# Patient Record
Sex: Female | Born: 1965
Health system: Southern US, Community
[De-identification: ages and names within clinical notes are randomized; demographics above are authoritative.]

## PROBLEM LIST (undated history)

## (undated) DIAGNOSIS — I10 Essential (primary) hypertension: Secondary | ICD-10-CM

## (undated) DIAGNOSIS — K589 Irritable bowel syndrome without diarrhea: Secondary | ICD-10-CM

## (undated) DIAGNOSIS — N946 Dysmenorrhea, unspecified: Secondary | ICD-10-CM

## (undated) DIAGNOSIS — M25561 Pain in right knee: Secondary | ICD-10-CM

## (undated) DIAGNOSIS — IMO0002 Reserved for concepts with insufficient information to code with codable children: Secondary | ICD-10-CM

## (undated) DIAGNOSIS — K1321 Leukoplakia of oral mucosa, including tongue: Secondary | ICD-10-CM

## (undated) DIAGNOSIS — R5383 Other fatigue: Secondary | ICD-10-CM

## (undated) DIAGNOSIS — D219 Benign neoplasm of connective and other soft tissue, unspecified: Secondary | ICD-10-CM

## (undated) DIAGNOSIS — F32A Depression, unspecified: Secondary | ICD-10-CM

## (undated) DIAGNOSIS — G4730 Sleep apnea, unspecified: Secondary | ICD-10-CM

## (undated) DIAGNOSIS — R0683 Snoring: Secondary | ICD-10-CM

## (undated) DIAGNOSIS — E785 Hyperlipidemia, unspecified: Secondary | ICD-10-CM

## (undated) DIAGNOSIS — M25562 Pain in left knee: Secondary | ICD-10-CM

## (undated) DIAGNOSIS — K219 Gastro-esophageal reflux disease without esophagitis: Secondary | ICD-10-CM

## (undated) DIAGNOSIS — F329 Major depressive disorder, single episode, unspecified: Secondary | ICD-10-CM

## (undated) DIAGNOSIS — N920 Excessive and frequent menstruation with regular cycle: Secondary | ICD-10-CM

## (undated) DIAGNOSIS — G4733 Obstructive sleep apnea (adult) (pediatric): Secondary | ICD-10-CM

## (undated) DIAGNOSIS — R7301 Impaired fasting glucose: Secondary | ICD-10-CM

## (undated) DIAGNOSIS — T7840XA Allergy, unspecified, initial encounter: Secondary | ICD-10-CM

## (undated) DIAGNOSIS — F419 Anxiety disorder, unspecified: Secondary | ICD-10-CM

## (undated) HISTORY — DX: Excessive and frequent menstruation with regular cycle: N92.0

## (undated) HISTORY — DX: Anxiety disorder, unspecified: F41.9

## (undated) HISTORY — DX: Allergy, unspecified, initial encounter: T78.40XA

## (undated) HISTORY — DX: Snoring: R06.83

## (undated) HISTORY — DX: Leukoplakia of oral mucosa, including tongue: K13.21

## (undated) HISTORY — DX: Gastro-esophageal reflux disease without esophagitis: K21.9

## (undated) HISTORY — PX: DILATION AND CURETTAGE OF UTERUS: SHX78

## (undated) HISTORY — DX: Reserved for concepts with insufficient information to code with codable children: IMO0002

## (undated) HISTORY — DX: Other fatigue: R53.83

## (undated) HISTORY — DX: Dysmenorrhea, unspecified: N94.6

## (undated) HISTORY — DX: Hyperlipidemia, unspecified: E78.5

## (undated) HISTORY — DX: Pain in right knee: M25.562

## (undated) HISTORY — DX: Essential (primary) hypertension: I10

## (undated) HISTORY — DX: Impaired fasting glucose: R73.01

## (undated) HISTORY — DX: Irritable bowel syndrome, unspecified: K58.9

## (undated) HISTORY — DX: Sleep apnea, unspecified: G47.30

## (undated) HISTORY — DX: Pain in right knee: M25.561

## (undated) HISTORY — DX: Obstructive sleep apnea (adult) (pediatric): G47.33

## (undated) HISTORY — PX: OTHER SURGICAL HISTORY: SHX169

## (undated) HISTORY — DX: Benign neoplasm of connective and other soft tissue, unspecified: D21.9

---

## 1998-04-04 HISTORY — PX: ECTOPIC PREGNANCY SURGERY: SHX613

## 2004-04-29 ENCOUNTER — Ambulatory Visit: Payer: Self-pay | Admitting: Family Medicine

## 2004-05-06 ENCOUNTER — Ambulatory Visit (HOSPITAL_COMMUNITY): Admission: RE | Admit: 2004-05-06 | Discharge: 2004-05-06 | Payer: Self-pay | Admitting: Family Medicine

## 2004-05-06 ENCOUNTER — Ambulatory Visit (HOSPITAL_COMMUNITY): Admission: RE | Admit: 2004-05-06 | Discharge: 2004-05-06 | Payer: Self-pay | Admitting: *Deleted

## 2004-05-21 ENCOUNTER — Ambulatory Visit: Payer: Self-pay | Admitting: Internal Medicine

## 2004-06-04 ENCOUNTER — Ambulatory Visit: Payer: Self-pay | Admitting: Internal Medicine

## 2004-06-10 ENCOUNTER — Ambulatory Visit: Payer: Self-pay | Admitting: Internal Medicine

## 2005-04-04 HISTORY — PX: COLONOSCOPY: SHX174

## 2005-04-28 ENCOUNTER — Ambulatory Visit: Payer: Self-pay | Admitting: Internal Medicine

## 2005-05-06 ENCOUNTER — Ambulatory Visit: Payer: Self-pay | Admitting: Internal Medicine

## 2005-07-14 ENCOUNTER — Ambulatory Visit: Payer: Self-pay | Admitting: Internal Medicine

## 2005-07-18 ENCOUNTER — Ambulatory Visit: Payer: Self-pay | Admitting: Internal Medicine

## 2005-07-28 ENCOUNTER — Ambulatory Visit: Payer: Self-pay | Admitting: Gynecology

## 2005-07-28 ENCOUNTER — Encounter (INDEPENDENT_AMBULATORY_CARE_PROVIDER_SITE_OTHER): Payer: Self-pay | Admitting: Gynecology

## 2005-08-04 ENCOUNTER — Ambulatory Visit: Payer: Self-pay | Admitting: Gastroenterology

## 2005-08-05 ENCOUNTER — Ambulatory Visit: Payer: Self-pay | Admitting: Gastroenterology

## 2005-08-05 ENCOUNTER — Encounter (INDEPENDENT_AMBULATORY_CARE_PROVIDER_SITE_OTHER): Payer: Self-pay | Admitting: *Deleted

## 2006-07-27 ENCOUNTER — Ambulatory Visit: Payer: Self-pay | Admitting: Internal Medicine

## 2006-07-27 LAB — CONVERTED CEMR LAB
ALT: 22 units/L (ref 0–40)
BUN: 8 mg/dL (ref 6–23)
Basophils Absolute: 0 10*3/uL (ref 0.0–0.1)
Basophils Relative: 0.2 % (ref 0.0–1.0)
Cholesterol: 198 mg/dL (ref 0–200)
Eosinophils Absolute: 0 10*3/uL (ref 0.0–0.6)
Glucose, Bld: 165 mg/dL — ABNORMAL HIGH (ref 70–99)
HCT: 38.4 % (ref 36.0–46.0)
HDL: 41.6 mg/dL (ref >39.0)
Hemoglobin: 12.7 g/dL (ref 12.0–15.0)
LDL Cholesterol: 125 mg/dL — ABNORMAL HIGH (ref 0–99)
Lymphocytes Relative: 30.7 % (ref 12.0–46.0)
Monocytes Absolute: 0.4 10*3/uL (ref 0.2–0.7)
Neutro Abs: 4.2 10*3/uL (ref 1.4–7.7)
Neutrophils Relative %: 62.4 % (ref 43.0–77.0)
Potassium: 3.8 meq/L (ref 3.5–5.1)
Sodium: 145 meq/L (ref 135–145)
Total CHOL/HDL Ratio: 4.8
VLDL: 31 mg/dL (ref 0–40)
WBC: 6.6 10*3/uL (ref 4.5–10.5)

## 2006-07-31 ENCOUNTER — Ambulatory Visit: Payer: Self-pay | Admitting: Cardiology

## 2006-07-31 ENCOUNTER — Ambulatory Visit: Payer: Self-pay | Admitting: Internal Medicine

## 2006-10-19 ENCOUNTER — Encounter: Payer: Self-pay | Admitting: Internal Medicine

## 2006-10-19 DIAGNOSIS — F411 Generalized anxiety disorder: Secondary | ICD-10-CM | POA: Insufficient documentation

## 2006-10-19 DIAGNOSIS — J309 Allergic rhinitis, unspecified: Secondary | ICD-10-CM | POA: Insufficient documentation

## 2006-10-19 DIAGNOSIS — K219 Gastro-esophageal reflux disease without esophagitis: Secondary | ICD-10-CM | POA: Insufficient documentation

## 2006-10-25 ENCOUNTER — Ambulatory Visit: Payer: Self-pay | Admitting: Internal Medicine

## 2006-10-25 DIAGNOSIS — R7309 Other abnormal glucose: Secondary | ICD-10-CM | POA: Insufficient documentation

## 2006-10-25 DIAGNOSIS — I10 Essential (primary) hypertension: Secondary | ICD-10-CM | POA: Insufficient documentation

## 2006-10-25 DIAGNOSIS — M25569 Pain in unspecified knee: Secondary | ICD-10-CM | POA: Insufficient documentation

## 2006-10-25 LAB — CONVERTED CEMR LAB: Blood Glucose, AC Bkfst: 120 mg/dL

## 2007-02-12 ENCOUNTER — Ambulatory Visit: Payer: Self-pay | Admitting: Internal Medicine

## 2007-02-14 LAB — CONVERTED CEMR LAB
ALT: 14 units/L (ref 0–35)
BUN: 7 mg/dL (ref 6–23)
Basophils Relative: 1.9 % — ABNORMAL HIGH (ref 0.0–1.0)
CO2: 29 meq/L (ref 19–32)
Chloride: 107 meq/L (ref 96–112)
Creatinine, Ser: 0.9 mg/dL (ref 0.4–1.2)
Eosinophils Absolute: 0.1 10*3/uL (ref 0.0–0.6)
GFR calc Af Amer: 88 mL/min
HCT: 36 % (ref 36.0–46.0)
HDL: 41.4 mg/dL (ref >39.0)
Ketones, urine, test strip: NEGATIVE
MCV: 86.1 fL (ref 78.0–100.0)
Neutro Abs: 3.1 10*3/uL (ref 1.4–7.7)
Nitrite: NEGATIVE
Potassium: 4.7 meq/L (ref 3.5–5.1)
RDW: 13.5 % (ref 11.5–14.6)
Sodium: 143 meq/L (ref 135–145)
Specific Gravity, Urine: >=1.03
Total Protein: 6.9 g/dL (ref 6.0–8.3)
Urobilinogen, UA: 0.2
VLDL: 18 mg/dL (ref 0–40)
WBC Urine, dipstick: NEGATIVE
WBC: 6 10*3/uL (ref 4.5–10.5)
pH: 5

## 2007-02-19 ENCOUNTER — Ambulatory Visit: Payer: Self-pay | Admitting: Internal Medicine

## 2007-04-25 ENCOUNTER — Ambulatory Visit: Payer: Self-pay | Admitting: Internal Medicine

## 2007-05-07 ENCOUNTER — Ambulatory Visit: Payer: Self-pay | Admitting: Internal Medicine

## 2007-05-07 DIAGNOSIS — R5383 Other fatigue: Secondary | ICD-10-CM

## 2007-05-07 DIAGNOSIS — R079 Chest pain, unspecified: Secondary | ICD-10-CM | POA: Insufficient documentation

## 2007-05-07 DIAGNOSIS — R5381 Other malaise: Secondary | ICD-10-CM | POA: Insufficient documentation

## 2007-05-07 DIAGNOSIS — R0989 Other specified symptoms and signs involving the circulatory and respiratory systems: Secondary | ICD-10-CM

## 2007-05-07 DIAGNOSIS — R0609 Other forms of dyspnea: Secondary | ICD-10-CM | POA: Insufficient documentation

## 2007-05-07 DIAGNOSIS — IMO0002 Reserved for concepts with insufficient information to code with codable children: Secondary | ICD-10-CM | POA: Insufficient documentation

## 2007-05-14 ENCOUNTER — Ambulatory Visit: Payer: Self-pay

## 2007-05-14 ENCOUNTER — Encounter: Payer: Self-pay | Admitting: Internal Medicine

## 2007-05-16 ENCOUNTER — Ambulatory Visit: Payer: Self-pay | Admitting: Pulmonary Disease

## 2007-05-16 DIAGNOSIS — G4733 Obstructive sleep apnea (adult) (pediatric): Secondary | ICD-10-CM | POA: Insufficient documentation

## 2007-05-22 ENCOUNTER — Telehealth: Payer: Self-pay | Admitting: Internal Medicine

## 2007-05-23 ENCOUNTER — Telehealth: Payer: Self-pay | Admitting: Internal Medicine

## 2007-05-24 ENCOUNTER — Telehealth: Payer: Self-pay | Admitting: Internal Medicine

## 2007-06-02 ENCOUNTER — Ambulatory Visit (HOSPITAL_BASED_OUTPATIENT_CLINIC_OR_DEPARTMENT_OTHER): Admission: RE | Admit: 2007-06-02 | Discharge: 2007-06-02 | Payer: Self-pay | Admitting: Pulmonary Disease

## 2007-06-02 ENCOUNTER — Encounter: Payer: Self-pay | Admitting: Pulmonary Disease

## 2007-06-07 ENCOUNTER — Ambulatory Visit: Payer: Self-pay | Admitting: Internal Medicine

## 2007-06-08 ENCOUNTER — Ambulatory Visit: Payer: Self-pay | Admitting: Pulmonary Disease

## 2007-06-14 ENCOUNTER — Telehealth (INDEPENDENT_AMBULATORY_CARE_PROVIDER_SITE_OTHER): Payer: Self-pay | Admitting: *Deleted

## 2007-06-18 ENCOUNTER — Ambulatory Visit: Payer: Self-pay | Admitting: Pulmonary Disease

## 2007-07-02 ENCOUNTER — Encounter: Admission: RE | Admit: 2007-07-02 | Discharge: 2007-09-30 | Payer: Self-pay | Admitting: Internal Medicine

## 2007-07-02 ENCOUNTER — Encounter: Payer: Self-pay | Admitting: Internal Medicine

## 2007-07-10 ENCOUNTER — Ambulatory Visit: Payer: Self-pay | Admitting: Internal Medicine

## 2007-07-10 ENCOUNTER — Telehealth: Payer: Self-pay | Admitting: Internal Medicine

## 2007-07-10 LAB — CONVERTED CEMR LAB
BUN: 12 mg/dL (ref 6–23)
Calcium: 9.5 mg/dL (ref 8.4–10.5)
GFR calc Af Amer: 78 mL/min
GFR calc non Af Amer: 64 mL/min

## 2007-08-04 ENCOUNTER — Telehealth: Payer: Self-pay | Admitting: Internal Medicine

## 2007-10-10 ENCOUNTER — Ambulatory Visit: Payer: Self-pay | Admitting: Family Medicine

## 2007-10-10 LAB — CONVERTED CEMR LAB
Bilirubin Urine: NEGATIVE
Blood in Urine, dipstick: NEGATIVE
Glucose, Urine, Semiquant: NEGATIVE
Protein, U semiquant: 30
Urobilinogen, UA: 0.2

## 2007-10-15 ENCOUNTER — Encounter: Payer: Self-pay | Admitting: Internal Medicine

## 2007-10-16 ENCOUNTER — Ambulatory Visit: Payer: Self-pay | Admitting: Internal Medicine

## 2007-10-16 LAB — CONVERTED CEMR LAB: Blood Glucose, Fingerstick: 90

## 2007-10-18 LAB — CONVERTED CEMR LAB
CO2: 28 meq/L (ref 19–32)
Calcium: 9.3 mg/dL (ref 8.4–10.5)
Chloride: 103 meq/L (ref 96–112)
Glucose, Bld: 82 mg/dL (ref 70–99)
Sodium: 139 meq/L (ref 135–145)

## 2007-10-22 ENCOUNTER — Ambulatory Visit: Payer: Self-pay | Admitting: Cardiology

## 2007-10-25 ENCOUNTER — Encounter: Admission: RE | Admit: 2007-10-25 | Discharge: 2007-12-25 | Payer: Self-pay | Admitting: Internal Medicine

## 2008-01-03 ENCOUNTER — Ambulatory Visit: Payer: Self-pay | Admitting: Internal Medicine

## 2008-01-04 ENCOUNTER — Telehealth: Payer: Self-pay | Admitting: Internal Medicine

## 2008-02-11 ENCOUNTER — Ambulatory Visit: Payer: Self-pay | Admitting: Internal Medicine

## 2008-02-11 LAB — CONVERTED CEMR LAB: Blood Glucose, Fingerstick: 110

## 2008-02-14 LAB — CONVERTED CEMR LAB
CO2: 30 meq/L (ref 19–32)
Chloride: 111 meq/L (ref 96–112)
GFR calc Af Amer: 77 mL/min
GFR calc non Af Amer: 64 mL/min

## 2008-05-12 ENCOUNTER — Ambulatory Visit: Payer: Self-pay | Admitting: Internal Medicine

## 2008-05-12 DIAGNOSIS — R519 Headache, unspecified: Secondary | ICD-10-CM | POA: Insufficient documentation

## 2008-05-12 DIAGNOSIS — R51 Headache: Secondary | ICD-10-CM | POA: Insufficient documentation

## 2008-05-16 ENCOUNTER — Telehealth: Payer: Self-pay | Admitting: Internal Medicine

## 2008-06-10 ENCOUNTER — Ambulatory Visit: Payer: Self-pay | Admitting: Internal Medicine

## 2008-06-16 ENCOUNTER — Encounter: Payer: Self-pay | Admitting: Internal Medicine

## 2008-06-16 LAB — CONVERTED CEMR LAB
BUN: 9 mg/dL (ref 6–23)
Chloride: 106 meq/L (ref 96–112)
GFR calc non Af Amer: 72 mL/min
Sodium: 143 meq/L (ref 135–145)

## 2008-07-17 ENCOUNTER — Encounter: Admission: RE | Admit: 2008-07-17 | Discharge: 2008-07-17 | Payer: Self-pay | Admitting: Obstetrics and Gynecology

## 2008-08-29 ENCOUNTER — Ambulatory Visit: Payer: Self-pay | Admitting: Internal Medicine

## 2008-09-02 ENCOUNTER — Telehealth: Payer: Self-pay | Admitting: Internal Medicine

## 2008-09-02 ENCOUNTER — Ambulatory Visit: Payer: Self-pay | Admitting: Internal Medicine

## 2008-09-03 ENCOUNTER — Encounter: Payer: Self-pay | Admitting: Internal Medicine

## 2008-10-20 ENCOUNTER — Ambulatory Visit: Payer: Self-pay | Admitting: Internal Medicine

## 2008-12-19 ENCOUNTER — Telehealth: Payer: Self-pay | Admitting: *Deleted

## 2008-12-24 ENCOUNTER — Telehealth: Payer: Self-pay | Admitting: *Deleted

## 2009-02-05 ENCOUNTER — Ambulatory Visit: Payer: Self-pay | Admitting: Internal Medicine

## 2009-02-05 LAB — CONVERTED CEMR LAB
ALT: 10 units/L (ref 0–35)
AST: 16 units/L (ref 0–37)
Albumin: 3.9 g/dL (ref 3.5–5.2)
Alkaline Phosphatase: 57 units/L (ref 39–117)
BUN: 10 mg/dL (ref 6–23)
Basophils Absolute: 0 10*3/uL (ref 0.0–0.1)
Bilirubin, Direct: 0 mg/dL (ref 0.0–0.3)
Creatinine,U: 217.5 mg/dL
GFR calc non Af Amer: 99.58 mL/min (ref >60)
Glucose, Bld: 86 mg/dL (ref 70–99)
HCT: 36.1 % (ref 36.0–46.0)
Hemoglobin: 12.4 g/dL (ref 12.0–15.0)
Hgb A1c MFr Bld: 5.6 % (ref 4.6–6.5)
Ketones, urine, test strip: NEGATIVE
MCV: 89.8 fL (ref 78.0–100.0)
Neutrophils Relative %: 63.9 % (ref 43.0–77.0)
Nitrite: NEGATIVE
Platelets: 390 10*3/uL (ref 150.0–400.0)
RBC: 4.02 M/uL (ref 3.87–5.11)
Specific Gravity, Urine: 1.025
Total CHOL/HDL Ratio: 4
Total Protein: 7.7 g/dL (ref 6.0–8.3)
Triglycerides: 79 mg/dL (ref 0.0–149.0)
Urobilinogen, UA: 0.2
WBC: 7.6 10*3/uL (ref 4.5–10.5)
pH: 5

## 2009-02-16 ENCOUNTER — Ambulatory Visit: Payer: Self-pay | Admitting: Internal Medicine

## 2009-04-07 ENCOUNTER — Ambulatory Visit: Payer: Self-pay | Admitting: Internal Medicine

## 2009-04-07 DIAGNOSIS — R209 Unspecified disturbances of skin sensation: Secondary | ICD-10-CM | POA: Insufficient documentation

## 2009-06-03 ENCOUNTER — Encounter: Payer: Self-pay | Admitting: Internal Medicine

## 2009-08-10 ENCOUNTER — Ambulatory Visit: Payer: Self-pay | Admitting: Internal Medicine

## 2009-08-10 LAB — CONVERTED CEMR LAB: Hgb A1c MFr Bld: 6.1 % (ref 4.6–6.5)

## 2009-08-17 ENCOUNTER — Ambulatory Visit: Payer: Self-pay | Admitting: Internal Medicine

## 2009-08-17 DIAGNOSIS — K1321 Leukoplakia of oral mucosa, including tongue: Secondary | ICD-10-CM | POA: Insufficient documentation

## 2009-08-25 ENCOUNTER — Telehealth: Payer: Self-pay | Admitting: Internal Medicine

## 2009-10-19 ENCOUNTER — Encounter: Admission: RE | Admit: 2009-10-19 | Discharge: 2009-10-19 | Payer: Self-pay | Admitting: Obstetrics and Gynecology

## 2009-12-21 ENCOUNTER — Ambulatory Visit: Payer: Self-pay | Admitting: Internal Medicine

## 2009-12-28 ENCOUNTER — Ambulatory Visit: Payer: Self-pay | Admitting: Internal Medicine

## 2009-12-28 DIAGNOSIS — E669 Obesity, unspecified: Secondary | ICD-10-CM | POA: Insufficient documentation

## 2009-12-28 DIAGNOSIS — M542 Cervicalgia: Secondary | ICD-10-CM | POA: Insufficient documentation

## 2010-03-04 ENCOUNTER — Ambulatory Visit: Payer: Self-pay | Admitting: Internal Medicine

## 2010-03-04 LAB — CONVERTED CEMR LAB
AST: 18 units/L (ref 0–37)
Albumin: 3.8 g/dL (ref 3.5–5.2)
Basophils Relative: 0.6 % (ref 0.0–3.0)
Bilirubin, Direct: 0.1 mg/dL (ref 0.0–0.3)
CO2: 27 meq/L (ref 19–32)
Chloride: 101 meq/L (ref 96–112)
Cholesterol: 213 mg/dL — ABNORMAL HIGH (ref 0–200)
Hgb A1c MFr Bld: 6.2 % (ref 4.6–6.5)
Lymphocytes Relative: 29.9 % (ref 12.0–46.0)
MCHC: 33.6 g/dL (ref 30.0–36.0)
Monocytes Relative: 6.7 % (ref 3.0–12.0)
Neutrophils Relative %: 59.9 % (ref 43.0–77.0)
Potassium: 4.2 meq/L (ref 3.5–5.1)
RBC: 4.12 M/uL (ref 3.87–5.11)
RDW: 13.8 % (ref 11.5–14.6)
Sodium: 136 meq/L (ref 135–145)
TSH: 1.02 microintl units/mL (ref 0.35–5.50)
Total Protein: 7.2 g/dL (ref 6.0–8.3)
WBC: 6.5 10*3/uL (ref 4.5–10.5)

## 2010-03-22 ENCOUNTER — Ambulatory Visit: Payer: Self-pay | Admitting: Internal Medicine

## 2010-05-02 LAB — CONVERTED CEMR LAB
ALT: 23 units/L (ref 0–40)
Alkaline Phosphatase: 52 units/L (ref 39–117)
BUN: 10 mg/dL (ref 6–23)
BUN: 8 mg/dL (ref 6–23)
Blood Glucose, Fingerstick: 123
Blood Glucose, Fingerstick: 95
CO2: 26 meq/L (ref 19–32)
CO2: 26 meq/L (ref 19–32)
Chloride: 107 meq/L (ref 96–112)
Eosinophils Relative: 0.9 % (ref 0.0–5.0)
GFR calc Af Amer: 78 mL/min
GFR calc non Af Amer: 58 mL/min
Glucose, Bld: 90 mg/dL (ref 70–99)
Glucose, Bld: 98 mg/dL (ref 70–99)
HCT: 36.5 % (ref 36.0–46.0)
Hemoglobin: 12.3 g/dL (ref 12.0–15.0)
Hgb A1c MFr Bld: 6.4 % — ABNORMAL HIGH (ref 4.6–6.0)
Lymphocytes Relative: 27.2 % (ref 12.0–46.0)
MCV: 86.9 fL (ref 78.0–100.0)
Monocytes Absolute: 0.4 10*3/uL (ref 0.2–0.7)
Neutro Abs: 5.4 10*3/uL (ref 1.4–7.7)
Neutrophils Relative %: 67.1 % (ref 43.0–77.0)
Potassium: 4.8 meq/L (ref 3.5–5.1)
Potassium: 6.3 meq/L (ref 3.5–5.1)
Potassium: 6.3 meq/L (ref 3.5–5.1)
RBC: 4.2 M/uL (ref 3.87–5.11)
RDW: 13.7 % (ref 11.5–14.6)
Sodium: 141 meq/L (ref 135–145)
Sodium: 141 meq/L (ref 135–145)
Sodium: 149 meq/L — ABNORMAL HIGH (ref 135–145)
WBC: 8.1 10*3/uL (ref 4.5–10.5)

## 2010-05-04 NOTE — Assessment & Plan Note (Signed)
Summary: 6 month rov/nrj   Vital Signs:  Patient profile:   45 year old female Menstrual status:  irregular LMP:     07/27/2009 Height:      65 inches Weight:      228 pounds BMI:     38.08 Pulse rate:   66 / minute BP sitting:   120 / 80  (left arm) Cuff size:   large  Vitals Entered By: Romualdo Bolk, CMA (AAMA) (Aug 17, 2009 8:11 AM)  Serial Vital Signs/Assessments:  Time      Position  BP       Pulse  Resp  Temp     By                     118/78                         Madelin Headings MD  Comments: large cuff sitting By: Madelin Headings MD   CC: Follow-up visit on labs, Hypertension Management LMP (date): 07/27/2009 LMP - Character: light Menarche (age onset years): 7   Menses interval (days): 28 Menstrual flow (days): 2-3 Enter LMP: 07/27/2009   History of Present Illness: Reve Iams comesin comes in today  for above . BG: not as good eating cause of stress  issues with early teen son  Since last  visit     also had dental evaluation dn white spots   were bx and precancer.   She brings in path report . Concern if needs other cancer screening .   non smoker had colonoscopy  in past  ? when .Gets mammo on a regular basis . NO sig GI signs now.   HT NO se of meds  Has allergy signs with itchy eyes and sneezing at  night needs rx for meds ( flex account)  uses opthcon a  and otc.    Hypertension History:      She denies headache, chest pain, palpitations, dyspnea with exertion, orthopnea, PND, peripheral edema, visual symptoms, neurologic problems, syncope, and side effects from treatment.  She notes no problems with any antihypertensive medication side effects.        Positive major cardiovascular risk factors include hypertension.  Negative major cardiovascular risk factors include female age less than 54 years old, no history of hyperlipidemia, and non-tobacco-user status.        Further assessment for target organ damage reveals no history of ASHD, cardiac  end-organ damage (CHF/LVH), stroke/TIA, peripheral vascular disease, renal insufficiency, or hypertensive retinopathy.     Preventive Screening-Counseling & Management  Alcohol-Tobacco     Alcohol drinks/day: 0     Smoking Status: never  Caffeine-Diet-Exercise     Caffeine use/day: <1     Does Patient Exercise: yes     Type of exercise: walking     Exercise (avg: min/session): 30-60     Times/week: 7  Comments: never used tobacco or spit tobacco .  Current Medications (verified): 1)  Nature's Own Hair, Skin and Nail Vitamin .Marland Kitchen.. 1 By Mouth Once Daily 2)  Metformin Hcl 500 Mg  Tabs (Metformin Hcl) .Marland Kitchen.. 1 By Mouth Two Times A Day  or As Directed 3)  Vaniqa 13.9 %  Crea (Eflornithine Hcl) .... Apply To Aaffected Area Two Times A Day Give Pt Large Tube 4)  Micardis Hct 80-12.5 Mg Tabs (Telmisartan-Hctz) .Marland Kitchen.. 1 By Mouth Once Daily 5)  Ambien 10 Mg Tabs (Zolpidem Tartrate) .Marland KitchenMarland KitchenMarland Kitchen  One Tab At [facility] As Needed For Sleep 6)  Mirena 20 Mcg/24hr Iud (Levonorgestrel) 7)  Hydrocodone-Acetaminophen 7.5-325 Mg Tabs (Hydrocodone-Acetaminophen) .Marland Kitchen.. 1 By Mouth Q 4-6 Hours As Needed Pain  Allergies (verified): 1)  ! * Lisinopril  Past History:  Past medical, surgical, family and social histories (including risk factors) reviewed, and no changes noted (except as noted below).  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 Family History Diabetes 1st degree relative parents Family History Hypertension Family History High cholesterol Family History Lung cancer   50  mom  non smoker  GF smoked  Family History of Prostate CA  father Family History of Arthritis allergies: on father's side cancer: father (prostate), mother (lung), maternal aunts (breast)      Social History: Reviewed history from 02/16/2009 and no changes required. Never Smoked Married husband left suddenly pt is single. pt has a child.     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         stressed with difficulties with teenage son  64   Physical Exam  General:  well-developed, well-nourished, and well-hydrated.  sleepy also  Head:  normocephalic and atraumatic.   Eyes:  vision grossly intact, pupils equal, and pupils round.   Nose:  no external deformity and no external erythema.  mild congestion Mouth:  pharynx pink and moist.   Neck:  No deformities, masses, or tenderness noted. Lungs:  Normal respiratory effort, chest expands symmetrically. Lungs are clear to auscultation, no crackles or wheezes. Heart:  Normal rate and regular rhythm. S1 and S2 normal without gallop, murmur, click, rub or other extra sounds.no lifts.   Pulses:  nl cap refill   nl pulses  Neurologic:  alert & oriented X3, strength normal in all extremities, and gait normal.  grip st nl  Cervical Nodes:  No lymphadenopathy noted Psych:  Oriented X3, normally interactive, good eye contact, not anxious appearing, and not depressed appearing.  a bit stressed but nl cognition   Impression & Recommendations:  Problem # 1:  HYPERTENSION (ICD-401.9) Assessment Unchanged  Her updated medication list for this problem includes:     Micardis Hct 80-12.5 Mg Tabs (Telmisartan-hctz) .Marland Kitchen... 1 by mouth once daily  BP today: 120/80 Prior BP: 120/80 (04/07/2009)  Prior 10 Yr Risk Heart Disease: 5 % (02/16/2009)  Labs Reviewed: K+: 3.6 (02/05/2009) Creat: : 0.8 (02/05/2009)   Chol: 210 (02/05/2009)   HDL: 47.30 (02/05/2009)   LDL: 108 (02/12/2007)   TG: 79.0 (02/05/2009)  Problem # 2:  HYPERGLYCEMIA (ICD-790.29) hg a1c up to 6.1  Her updated medication list for this problem includes:    Metformin Hcl 500 Mg Tabs (Metformin hcl) .Marland Kitchen... 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    seems utd for routine screening and no blood  screens would  seem shelpful.   MOm had lung cancer and she has never used tobaacco and no heavy exposures.  Problem # 4:  ALLERGIC RHINITIS (ICD-477.9)  Her updated medication list for this problem includes:    Cetirizine Hcl 10 Mg Tabs (Cetirizine hcl) .Marland Kitchen... 1 by mouth once daily for allergy  Problem # 5:  FAMILY STRESS (ICD-V61.9) counseled  .   Complete Medication List: 1)  Nature's Own Hair, Skin and Nail Vitamin  .Marland Kitchen.. 1 by mouth once daily 2)  Metformin Hcl 500 Mg Tabs (Metformin hcl) .Marland Kitchen.. 1 by mouth two times a day  or as directed 3)  Vaniqa 13.9 % Crea (Eflornithine hcl) .... Apply to aaffected area two times a day give pt large tube 4)  Micardis Hct 80-12.5 Mg Tabs (Telmisartan-hctz) .Marland Kitchen.. 1 by mouth once daily 5)  Ambien 10 Mg Tabs (Zolpidem tartrate) .... One tab at hs as needed for sleep 6)  Mirena 20 Mcg/24hr Iud (Levonorgestrel) 7)  Hydrocodone-acetaminophen 7.5-325 Mg Tabs (Hydrocodone-acetaminophen) .Marland Kitchen.. 1 by mouth q 4-6 hours as needed pain 8)  Opcon-a 0.027-0.315 % Soln (Naphazoline-pheniramine) .Marland Kitchen.. 1-2 drops   hs or as needed 9)  Cetirizine Hcl 10 Mg Tabs (Cetirizine hcl) .Marland Kitchen.. 1 by mouth once daily for  allergy  Hypertension Assessment/Plan:      The patient's hypertensive risk group is category A: No risk factors and no target organ damage.  Her calculated 10 year risk of coronary heart disease is 5 %.  Today's blood pressure is 120/80.  Her blood pressure goal is < 140/85.  Patient Instructions: 1)  limit sweets and sugars and weight loss would help your medical problems . 2)  continue meds   3)  check hg a1c  in 4 months and then ROV  4)  will check on colonoscopy  Prescriptions: CETIRIZINE HCL 10 MG TABS (CETIRIZINE HCL) 1 by mouth once daily for allergy  #30 x 6   Entered and Authorized by:   Madelin Headings MD   Signed by:   Madelin Headings MD on 08/17/2009   Method used:   Print then Give to Patient   RxID:   [id number] OPCON-A 0.027-0.315 % SOLN (NAPHAZOLINE-PHENIRAMINE) 1-2 drops   hs or as needed  #1 bottle x 2   Entered and Authorized by:   Madelin Headings MD   Signed by:   Madelin Headings MD on 08/17/2009   Method used:   Print then Give to Patient   RxID:   [id number]

## 2010-05-04 NOTE — Assessment & Plan Note (Signed)
Summary: ROA/FUP/RCD   Vital Signs:  Patient profile:   45 year old female Menstrual status:  IUD LMP:     12/27/2009 Weight:      233 pounds Pulse rate:   80 / minute BP sitting:   120 / 80  (left arm) Cuff size:   large  Vitals Entered By: Romualdo Bolk, CMA (AAMA) (December 28, 2009 8:24 AM) CC: Follow-up visit on labs, Hypertension Management CBG Result 104 LMP (date): 12/27/2009 LMP - Character: light Menarche (age onset years): 7   Menses interval (days): 28 Menstrual flow (days): 2-3 Menstrual Status IUD Enter LMP: 12/27/2009   History of Present Illness: Crystal Townsend  comes in today  for multiple medical problems . Since last visit  here  there have been no major changes in health status  . HT doing well and insurance  is   paying for meds . No se .  HYperglycemia : not taking   metformin and getting 100  one hour post prandial  but no eating as well as she should recently   . No change in vision   able to exercise and no new infection.   Sleep  : asks for  sleep aid    ocass midnight to 4   has low  level sleep apnea.    Last check up per OB GYNE  was in may  and ok .   Hypertension History:      She denies headache, chest pain, palpitations, dyspnea with exertion, orthopnea, PND, peripheral edema, visual symptoms, neurologic problems, syncope, and side effects from treatment.  She notes no problems with any antihypertensive medication side effects.        Positive major cardiovascular risk factors include hypertension.  Negative major cardiovascular risk factors include female age less than 23 years old, no history of hyperlipidemia, and non-tobacco-user status.        Further assessment for target organ damage reveals no history of ASHD, cardiac end-organ damage (CHF/LVH), stroke/TIA, peripheral vascular disease, renal insufficiency, or hypertensive retinopathy.     Preventive Screening-Counseling & Management  Alcohol-Tobacco     Alcohol drinks/day: 0   Smoking Status: never  Caffeine-Diet-Exercise     Caffeine use/day: <1     Does Patient Exercise: yes     Type of exercise: walking     Exercise (avg: min/session): 30-60     Times/week: 7  Current Medications (verified): 1)  Nature's Own Hair, Skin and Nail Vitamin .Marland Kitchen.. 1 By Mouth Once Daily 2)  Metformin Hcl 500 Mg  Tabs (Metformin Hcl) .Marland Kitchen.. 1 By Mouth Two Times A Day  or As Directed 3)  Vaniqa 13.9 %  Crea (Eflornithine Hcl) .... Apply To Aaffected Area Two Times A Day Give Pt Large Tube 4)  Micardis Hct 80-12.5 Mg Tabs (Telmisartan-Hctz) .Marland Kitchen.. 1 By Mouth Once Daily 5)  Ambien 10 Mg Tabs (Zolpidem Tartrate) .... One Tab At [facility] As Needed For Sleep 6)  Mirena 20 Mcg/24hr Iud (Levonorgestrel) 7)  Hydrocodone-Acetaminophen 7.5-325 Mg Tabs (Hydrocodone-Acetaminophen) .Marland Kitchen.. 1 By Mouth Q 4-6 Hours As Needed Pain 8)  Opcon-A 0.027-0.315 % Soln (Naphazoline-Pheniramine) .Marland Kitchen.. 1-2 Drops   Hs or As Needed 9)  Cetirizine Hcl 10 Mg Tabs (Cetirizine Hcl) .Marland Kitchen.. 1 By Mouth Once Daily For Allergy 10)  Probiotic  Caps (Probiotic Product) .Marland Kitchen.. 1 By Mouth Once Daily or As Directed  Allergies (verified): 1)  ! * Lisinopril 2)  Metformin Hcl  Past History:  Past medical, surgical, family and  social histories (including risk factors) reviewed, and no changes noted (except as noted below).  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 1997- C-Section 2000- Ectopic Pregnancy Oral biopsy  Past History:  Care  Management: Gynecology: [place] Guitar Hand :   [facility] in past   Family History: Reviewed history from 08/17/2009 and no changes required. Family History Diabetes 1st degree relative parents Family History Hypertension Family History High cholesterol Family History Lung cancer   50  mom  non smoker  GF smoked  Family History of Prostate CA  father Family History of Arthritis allergies: on father's side cancer: father (prostate), mother (lung), maternal aunts (breast)      Social History: Reviewed history from 08/17/2009 and no changes required. Never Smoked Married husband left suddenly pt is single. 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 ocass numbnewss in thumbs  no weakness or leg signs   rest of ros no change or as per hpi  Physical Exam  General:  Well-developed,well-nourished,in no acute distress; alert,appropriate and cooperative throughout examination Head:  normocephalic and atraumatic.   Eyes:  vision grossly intact and pupils equal.   Neck:  No deformities, masses, or tenderness noted. Lungs:  Normal respiratory effort, chest expands symmetrically. Lungs are clear to auscultation, no crackles or wheezes. Heart:  Normal rate and regular rhythm. S1 and S2 normal without gallop, murmur, click, rub or other extra sounds. Abdomen:  Bowel sounds positive,abdomen soft and non-tender without masses, organomegaly or   noted. Pulses:  nl cap refill   nl pulses  Extremities:  no clubbing cyanosis or edema  Neurologic:  alert & oriented X3, strength normal in all extremities, and gait normal.   Skin:  turgor normal, color normal, no ecchymoses, and no petechiae.   Cervical Nodes:  no anterior cervical adenopathy and no posterior cervical adenopathy.   Psych:  Oriented X3, good eye contact, not anxious appearing, and not depressed appearing.      Impression & Recommendations:  Problem # 1:  HYPERGLYCEMIA (ICD-790.29) Assessment Deteriorated  on no meds  had hx of nausea with immedicate release metformin in past and hesitant to take .  IS aware that she could improve lifestyle intervention and want to ty this for another 3 months  Hg a1c up from 5 range a year ago to 6.7 today  Labs Reviewed: Creat: 0.8 (02/05/2009)     Problem # 2:  HYPERTENSION (ICD-401.9) Assessment: Unchanged  Her updated medication list for this problem includes:    Micardis Hct 80-12.5 Mg Tabs (Telmisartan-hctz) .Marland Kitchen... 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 cause   disc use of meds .   Discussed risk benefit    Problem # 6:  NECK PAIN (ICD-723.1) ocassional and p ostional bending over  no other signs but does have intermettenet thumb numbness and no weakness .  pos radicular  No lE signs  this is very mild  so disc  minitoring appropriate exercise and call if persistent or  progressive  or alarm features.  Her updated medication list for this problem includes:    Hydrocodone-acetaminophen 7.5-325 Mg Tabs (Hydrocodone-acetaminophen) .Marland Kitchen... 1 by mouth q 4-6 hours as needed pain  Problem # 7:  ALLERGIC RHINITIS (ICD-477.9)  ok to rx opthcon  Her updated medication list for this problem includes:    Cetirizine Hcl 10 Mg Tabs (Cetirizine hcl) .Marland Kitchen... 1 by mouth once daily for allergy  Discussed use of allergy  medications and environmental measures.   Complete Medication List: 1)  Nature's Own Hair, Skin and Nail Vitamin  .Marland Kitchen.. 1 by mouth once daily 2)  Vaniqa 13.9 % Crea (Eflornithine hcl) .... Apply to aaffected area two times a day give pt large tube 3)  Micardis Hct 80-12.5 Mg Tabs (Telmisartan-hctz) .Marland Kitchen.. 1 by mouth once daily 4)  Ambien 10 Mg Tabs (Zolpidem tartrate) .... One tab at hs as needed for sleep 5)  Mirena 20 Mcg/24hr Iud (Levonorgestrel) 6)  Hydrocodone-acetaminophen 7.5-325 Mg Tabs (Hydrocodone-acetaminophen) .Marland Kitchen.. 1 by mouth q 4-6 hours as needed pain 7)  Opcon-a 0.027-0.315 % Soln (Naphazoline-pheniramine) .Marland Kitchen.. 1-2 drops   hs or as needed 8)  Cetirizine Hcl 10 Mg Tabs (Cetirizine hcl) .Marland Kitchen.. 1 by mouth once daily for allergy 9)  Probiotic Caps (Probiotic product) .Marland Kitchen.. 1 by mouth once daily or as directed  Other Orders: Admin 1st Vaccine (45409) Flu Vaccine 15yrs + (81191) Capillary Blood Glucose/CBG (47829)  Hypertension Assessment/Plan:      The patient's hypertensive risk group is category A: No risk factors and no target organ damage.  Her calculated 10 year risk of coronary heart disease is 5 %.  Today's blood pressure is 120/80.  Her blood pressure goal is < 140/85.  Patient Instructions: 1)  try sleep aid as directed  exercise can help sleep.  2)  weight loss and  intensification of  lifestyle intervention  3)  eating better   will help the blood sugar control. 4)  Please schedule a follow-up appointment in 3 months .  5)  BMP prior to visit, ICD-9:  6)  Hepatic Panel prior to visit ICD-9:  7)  Lipid panel prior to visit ICD-9 :  8)  TSH prior to visit ICD-9 :  9)  CBC w/ Diff prior to visit ICD-9 :  10)  HgBA1c prior to visit  ICD-9:  11)  v70 and hyperglycemia  12)  Urine Microalbumin prior to visit ICD-9 :  Prescriptions: OPCON-A 0.027-0.315 % SOLN (NAPHAZOLINE-PHENIRAMINE) 1-2 drops   hs or as needed  #1 bottle x 2   Entered and Authorized by:   Madelin Headings  MD   Signed by:   Madelin Headings MD on 12/28/2009   Method used:   Print then Give to Patient  RxID:   [id number] AMBIEN 10 MG TABS (ZOLPIDEM TARTRATE) one tab at HS as needed for sleep  #30 x 1   Entered and Authorized by:   Madelin Headings MD   Signed by:   Madelin Headings MD on 12/28/2009   Method used:   Print then Give to Patient   RxID:   [id number]      Flu Vaccine Consent Questions     Do you have a history of severe allergic reactions to this vaccine? no    Any prior history of allergic reactions to egg and/or gelatin? no    Do you have a sensitivity to the preservative Thimersol? no    Do you have a past history of Guillan-Barre Syndrome? no    Do you currently have an acute febrile illness? no    Have you ever had a severe reaction to latex? no    Vaccine information given and explained to patient? yes    Are you currently pregnant? no    Lot Number:[id number]   Exp Date:10/02/2010   Site Given  Left Deltoid IM Romualdo Bolk, CMA (AAMA)  December 28, 2009 8:29 AM

## 2010-05-04 NOTE — Assessment & Plan Note (Signed)
Summary: ? carpal tunnel?/dm   Vital Signs:  Patient profile:   45 year old female Menstrual status:  irregular LMP:     01/16/2009 Weight:      227 pounds Temp:     97.4 degrees F oral Pulse rate:   60 / minute BP sitting:   120 / 80  (right arm) Cuff size:   large  Vitals Entered By: Romualdo Bolk, CMA (AAMA) (April 07, 2009 9:51 AM) CC: Bilateral numbness and tingling in fingers that goes up arms this has been going on for 2 months but has gotten worse. Pt has also lost her voice. This started on 12/31 with coughing and congestion as well. LMP (date): 01/16/2009 LMP - Character: light Menarche (age onset years): 7   Menses interval (days): 28 Menstrual flow (days): 2-3 Enter LMP: 01/16/2009   History of Present Illness: Tell patient that comesin today for   for acute visit   for above.   #No voice and coughin since friday. But no fever or angioedema or SOB. hard to work cause has to talk asks for note for work. #numb hands when sleeping and now  going all day   and up arm fingers  weakness ?  is right handed and worse in left hand. No sig neck pain but gets some upper body pain  at times.   numbness now inday and lasts 5-10 minutes.    No treatment given . .patern is persistent and progressive  .    #HT: no problem with meds.   Preventive Screening-Counseling & Management  Alcohol-Tobacco     Alcohol drinks/day: 0     Smoking Status: never  Caffeine-Diet-Exercise     Caffeine use/day: <1     Does Patient Exercise: yes     Type of exercise: walking     Exercise (avg: min/session): 30-60     Times/week: 7  Current Medications (verified): 1)  Nature's Own Hair, Skin and Nail Vitamin .Marland Kitchen.. 1 By Mouth Once Daily 2)  Metformin Hcl 500 Mg  Tabs (Metformin Hcl) .Marland Kitchen.. 1 By Mouth Two Times A Day  or As Directed 3)  Vaniqa 13.9 %  Crea (Eflornithine Hcl) .... Apply To Aaffected Area Two Times A Day Give Pt Large Tube 4)  Micardis Hct 80-12.5 Mg Tabs (Telmisartan-Hctz)  .Marland Kitchen.. 1 By Mouth Once Daily 5)  Ambien 10 Mg Tabs (Zolpidem Tartrate) .... One Tab At [facility] As Needed For Sleep 6)  Mirena 20 Mcg/24hr Iud (Levonorgestrel) 7)  Hydrocodone-Acetaminophen 7.5-325 Mg Tabs (Hydrocodone-Acetaminophen) .Marland Kitchen.. 1 By Mouth Q 4-6 Hours As Needed Pain  Allergies (verified): 1)  ! * Lisinopril  Past History:  Past medical, surgical, family and social histories (including risk factors) reviewed, and no changes noted (except as noted below).  Past Medical History: Reviewed history from 05/12/2008 and no changes required. 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 1997- C-Section 2000- Ectopic Pregnancy  Family History: Reviewed history from 06/10/2008 and no changes required. Family History Diabetes 1st degree relative parents Family History Hypertension Family History High cholesterol Family History Lung cancer   5  mom Family History of Prostate CA  father Family History of Arthritis allergies:  on father's side cancer: father (prostate), mother (lung), maternal aunts (breast)      Social History: Reviewed history from 02/16/2009 and no changes required. Never Smoked Married husband left suddenly pt is single. 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  The patient denies anorexia, fever, weight loss, weight gain, vision loss, decreased hearing, syncope, dyspnea on exertion, peripheral edema, prolonged cough, abdominal pain, melena, hematochezia, severe indigestion/heartburn, muscle  weakness, transient blindness, difficulty walking, abnormal bleeding, enlarged lymph nodes, and angioedema.    Physical Exam  General:  very hoarse in nad   Head:  normocephalic and atraumatic.   Eyes:  vision grossly intact, pupils equal, and pupils round.   Ears:  R ear normal and L ear normal.   Nose:  no external deformity, no external erythema, and no nasal discharge.   Mouth:  pharynx pink and moist.   Neck:  No deformities, masses, or tenderness noted. Lungs:  Normal respiratory effort, chest expands symmetrically. Lungs are clear to auscultation, no crackles or wheezes.no dullness.   Heart:  Normal rate and regular rhythm. S1 and S2 normal without gallop, murmur, click, rub or other extra sounds.no lifts.   Msk:  no joint swelling and no joint warmth.    neg phelans .    no atrophy noted and elbow and neck normal  Pulses:  nl cap refill   nl pulses  Extremities:  no clubbing cyanosis or edema  Neurologic:  alert & oriented X3, strength normal in all extremities, and gait normal.  grip st nl  Skin:  turgor normal, color normal, and no ecchymoses.   Cervical Nodes:  No lymphadenopathy noted Psych:  Oriented X3, good eye contact, not anxious appearing, and not depressed appearing.     Impression & Recommendations:  Problem # 1:  NUMBNESS (ICD-782.0) poss  cts  although  somewhat atypical districution but typical context .    no obv  peripheral neuropathy  .   not from diabetes.   Problem # 2:  LARYNGITIS, ACUTE (ICD-464.00)  seems viral and otherwise uncommplicated     no angioedema like   whith ace. note for work for [employer].    Problem # 3:  HYPERTENSION (ICD-401.9) controlled   had angioedema with ACE. Her updated medication list for this problem includes:    Micardis Hct 80-12.5 Mg Tabs (Telmisartan-hctz) .Marland Kitchen... 1 by mouth once daily  BP today: 120/80 Prior BP: 120/80 (02/16/2009)  Prior 10 Yr Risk Heart Disease: 5 % (02/16/2009)  Labs Reviewed: K+: 3.6  (02/05/2009) Creat: : 0.8 (02/05/2009)   Chol: 210 (02/05/2009)   HDL: 47.30 (02/05/2009)   LDL: 108 (02/12/2007)   TG: 79.0 (02/05/2009)  Problem # 4:  FAMILY HISTORY DIABETES 1ST DEGREE RELATIVE (ICD-V18.0) Assessment: Comment Only hg a1c was 5.6 in November  Complete Medication List: 1)  Nature's Own Hair, Skin and Nail Vitamin  .Marland Kitchen.. 1 by mouth once daily 2)  Metformin Hcl 500 Mg Tabs (Metformin hcl) .Marland Kitchen.. 1 by mouth two times a day  or as directed 3)  Vaniqa 13.9 % Crea (Eflornithine hcl) .... Apply to aaffected area two times a day give pt large tube 4)  Micardis Hct 80-12.5 Mg Tabs (Telmisartan-hctz) .Marland Kitchen.. 1 by mouth once daily 5)  Ambien 10 Mg Tabs (Zolpidem tartrate) .... One tab at hs as needed for sleep 6)  Mirena 20 Mcg/24hr Iud (Levonorgestrel)  7)  Hydrocodone-acetaminophen 7.5-325 Mg Tabs (Hydrocodone-acetaminophen) .Marland Kitchen.. 1 by mouth q 4-6 hours as needed pain  Patient Instructions: 1)  voice rest for a few days. 2)  short wrist  splints to be worn at night   for a few weeks to see if numbness gets better. If not then call and we will do a referral  for poss CTS.  Note for work  voice rest .

## 2010-05-04 NOTE — Letter (Signed)
Summary: Out of Work  Adult nurse at [employer]  [address]   [place], [place] [address]   Phone: [phone]  Fax: [phone]    April 07, 2009   Employee:  Crystal Townsend    To Whom It May Concern:   For Medical reasons, please excuse the above named employee from work for the following dates:  Start:   Tuesday 4  2010  End:   Thursday 6th 2010  If you need additional information, please feel free to contact our office.         Sincerely,    Madelin Headings MD

## 2010-05-04 NOTE — Progress Notes (Signed)
Summary: rx for probiotic med  Phone Note Call from Patient Call back at Work Phone [phone]   Caller: Patient Call For: Madelin Headings MD Summary of Call: pt has IBS she would like a  rx for  probiotic med, so she can use her flex card. Initial call taken by: Heron Sabins,  Aug 25, 2009 10:51 AM  Follow-up for Phone Call        Per Dr. Fabian Sharp- Okay to do just find out which one she is on Follow-up by: Romualdo Bolk, CMA Duncan Dull),  Aug 25, 2009 1:28 PM  Additional Follow-up for Phone Call Additional follow up Details #1::        Spoke to pt and she is not currently taking a probiotic but wants a rx caaled in. I called in a rx for her. She states that she will go ahead and get the rx filled at [facility] Additional Follow-up by: Romualdo Bolk, CMA (AAMA),  Aug 25, 2009 2:10 PM    New/Updated Medications: PROBIOTIC  CAPS (PROBIOTIC PRODUCT) 1 by mouth once daily or as directed Prescriptions: PROBIOTIC  CAPS (PROBIOTIC PRODUCT) 1 by mouth once daily or as directed  #30 x 11   Entered by:   Romualdo Bolk, CMA (AAMA)   Authorized by:   Madelin Headings MD   Signed by:   Romualdo Bolk, CMA (AAMA) on 08/25/2009   Method used:   Electronically to        [facility] # [phone]* (retail)       [address]       [place], [place]  [address]       Ph: [phone]       Fax: [phone]   RxID:   [id number]
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
## 2010-05-24 ENCOUNTER — Encounter: Payer: Self-pay | Admitting: Internal Medicine

## 2010-05-24 ENCOUNTER — Ambulatory Visit (INDEPENDENT_AMBULATORY_CARE_PROVIDER_SITE_OTHER): Payer: Commercial Managed Care - PPO | Admitting: Internal Medicine

## 2010-05-24 DIAGNOSIS — M542 Cervicalgia: Secondary | ICD-10-CM | POA: Insufficient documentation

## 2010-05-24 MED ORDER — CYCLOBENZAPRINE HCL 10 MG PO TABS: 10.0000 mg | ORAL_TABLET | Freq: Three times a day (TID) | ORAL | Status: AC | PRN

## 2010-05-24 NOTE — Progress Notes (Signed)
  Subjective:    Patient ID: Crystal Townsend, female    DOB: Sep 01, 1965, 45 y.o.   MRN: [id number]  HPI  Pt presents to clinic for evaluation of neck pain. Notes 3d h/o intermittent left ant/lateral neck pain worse with head turning. No injury/trauma or radiating pain down arms. Improved with rubbing the area and use of heating pad. No improvement with tylenol. Wonders if left ear may be involved. Denies f/c, ear pain, ear drainage or recent illness. No other complaints.  Reviewed PMH, medications, and allergies.    Review of Systems  Constitutional: Negative for fever, chills and diaphoresis.  HENT: Positive for neck pain and neck stiffness. Negative for ear pain, congestion, facial swelling, rhinorrhea and ear discharge.   Eyes: Negative for pain, discharge and redness.  Musculoskeletal: Negative for back pain and arthralgias.  Neurological: Negative for headaches.       Objective:   Physical Exam  Constitutional: She appears well-developed and well-nourished. No distress.  HENT:  Head: Normocephalic and atraumatic.  Right Ear: Tympanic membrane, external ear and ear canal normal.  Left Ear: Tympanic membrane, external ear and ear canal normal.  Nose: Nose normal.  Mouth/Throat: Oropharynx is clear and moist. No oropharyngeal exudate.  Neck: Muscular tenderness present.         Pain, tenderness and mild spasm along left SCM. FROM of neck. No masses or adenopathy  Neurological: She is alert.  Skin: She is not diaphoretic.          Assessment & Plan:

## 2010-05-24 NOTE — Assessment & Plan Note (Signed)
Appears to be musculoskeletal with mild spasm. Attempt flexeril prn and cautioned regarding possible sedating effect. OTC motrin prn with food and continue heating pad prn.  Followup if no improvement or worsening.

## 2010-08-17 NOTE — Procedures (Signed)
NAME:  Crystal Townsend, Crystal Townsend NO.:  [id number]   MEDICAL RECORD NO.:  [id number]          PATIENT TYPE:  OUT   LOCATION:  SLEEP CENTER                 FACILITY[facility]  [facility]   PHYSICIAN:  Barbaraann Share, MD,FCCPDATE OF BIRTH:  12/29/1963   DATE OF STUDY:  06/02/2007                            NOCTURNAL POLYSOMNOGRAM   REFERRING PHYSICIAN:  Barbaraann Share, MD,FCCP   INDICATION FOR STUDY:  Hypersomnia with sleep apnea.   EPWORTH SLEEPINESS SCORE:  19   MEDICATIONS:   SLEEP ARCHITECTURE:  The patient had a total sleep time of 400.  Sleep  onset latency was normal at 22 minutes and REM onset was normal as well  at 77 minutes.  Sleep efficiency was mildly decreased at 89%.   RESPIRATORY DATA:  The patient was found to have 41 obstructive apneas  and 7 obstructive hypopneas for an apnea/hypopnea index of 7 events per  hour.  These occurred almost exclusively during REM and there was loud  snoring noted throughout.   OXYGEN DATA:  There was O2 desaturation as low as 70% with her  obstructive events.   CARDIAC DATA:  No clinically significant arrhythmias were noted.   MOVEMENT-PARASOMNIA:  No significant leg jerks or abnormal behaviors  were noted.   IMPRESSIONS-RECOMMENDATIONS:  Mild obstructive sleep apnea/hypopnea  syndrome with an apnea/hypopnea index of 7 events per hour, and oxygen  desaturation as low as 70%.  Because the majority of her events occurred  during REM, this degree of sleep apnea more than likely has a greater  impact clinically.  Treatment for this degree of sleep apnea can include  weight loss alone if applicable, upper airway surgery, oral appliance,  and also CPAP.  Clinical correlation is suggested.      Barbaraann Share, MD,FCCP  Diplomate, American Board of Sleep  Medicine  Electronically Signed    [facility]/MEDQ  D:  06/08/2007 13:35:24  T:  06/08/2007 19:50:53  Job:  [id number]

## 2010-08-20 NOTE — Group Therapy Note (Signed)
NAME:  Crystal Townsend, Crystal Townsend NO.:  [id number]   MEDICAL RECORD NO.:  [id number]          PATIENT TYPE:  WOC   LOCATION:  [facility]                   FACILITY:  [facility]   PHYSICIAN:  Ginger Carne, MD DATE OF BIRTH:  [birth year]   DATE OF SERVICE:  07/28/2005                                    CLINIC NOTE   REASON FOR CONSULTATION:  Annual GYN examination.   HISTORY OF PRESENT ILLNESS:  This patient is a 45 year old multiparous  African-American female who returns today basically for a refill on her oral  contraceptives.  She is seen at the family practice clinic who prescribes  triamterene/hydrochlorothiazide, Effexor, and Ambien.  She states that she  is otherwise in good health.  She has no significantly new symptoms compared  to her visit about one year ago.   PHYSICAL FINDINGS:  CARDIAC:  Without murmurs or enlargements.  Regular rate  and rhythm.  CHEST:  Clear to percussion, auscultation.  BREASTS:  Without masses, discharge, thickenings, or tenderness.  ABDOMEN:  Soft without gross hepatosplenomegaly.  PELVIC:  External genitalia, vulva, and vagina normal.  CERVIX:  Smooth without erosions or lesions.  Pap smear obtained.  Uterus  small, anteverted, and flex.  Both adnexa palpable, found to be normal.   Screening mammogram normal from May 06, 2004.   IMPRESSION:  Normal physical examination.   PLAN:  Patient will be prescribed __________ for one year.  Patient receives  blood work through the family practice clinic and this will be deferred at  this time.           ______________________________  Ginger Carne, MD     [facility]/MEDQ  D:  07/28/2005  T:  07/29/2005  Job:  [id number]

## 2010-08-20 NOTE — Group Therapy Note (Signed)
NAME:  Crystal Townsend, Crystal Townsend NO.:  [id number]   MEDICAL RECORD NO.:  [id number]          PATIENT TYPE:  WOC   LOCATION:  [facility]                   FACILITY:  [facility]   PHYSICIAN:  Argentina Donovan, MD        DATE OF BIRTH:  1965/05/05   DATE OF SERVICE:  04/29/2004                                    CLINIC NOTE   REASON FOR VISIT:  This patient is a 45 year old gravida 2 para 1-0-1-1 who  works for the city, is in for her annual GYN examination, is on Trivora for  birth control which will be renewed, takes MVI, Zoloft,  triamterene/hydrochlorothiazide, and Ambien.  She states she has had some  abnormality in her last Pap smear but has not had one in 2 years.  Other  complaint is severe night sweats, probably not perimenopausal because she is  on Trivora birth control.  Her other complaint is a lack of libido, possibly  related to Zoloft and the hydrochlorothiazide along with the birth control  pill.   PHYSICAL EXAMINATION:  Examination general is normal, BUS within normal  limits.  The vagina is clean and well rugated.  The uterus is normal size,  shape, consistency, and the adnexa is normal.  Rectal is normal, no masses.  Abdomen is soft, flat, and nontender, no masses or organomegaly.   The patient is scheduled for mammogram, chest PA and lateral because of  night sweats, and has been suggested tapes as far as libido.  Also discuss  with her internist the possibility of changing her Zoloft to Wellbutrin and  perhaps changing her blood pressure medication.  We do not have a blood  pressure this time.  We are going to get that before discharge.      PR/MEDQ  D:  04/29/2004  T:  04/30/2004  Job:  [id number]

## 2010-09-14 ENCOUNTER — Other Ambulatory Visit (INDEPENDENT_AMBULATORY_CARE_PROVIDER_SITE_OTHER): Payer: 59

## 2010-09-14 ENCOUNTER — Encounter: Payer: Self-pay | Admitting: Internal Medicine

## 2010-09-14 DIAGNOSIS — R7301 Impaired fasting glucose: Secondary | ICD-10-CM

## 2010-09-14 DIAGNOSIS — E785 Hyperlipidemia, unspecified: Secondary | ICD-10-CM

## 2010-09-14 DIAGNOSIS — R7309 Other abnormal glucose: Secondary | ICD-10-CM

## 2010-09-14 LAB — LIPID PANEL
Cholesterol: 218 mg/dL — ABNORMAL HIGH (ref 0–200)
HDL: 48.2 mg/dL (ref >39.00)
Total CHOL/HDL Ratio: 5
Triglycerides: 63 mg/dL (ref 0.0–149.0)
VLDL: 12.6 mg/dL (ref 0.0–40.0)

## 2010-09-21 ENCOUNTER — Ambulatory Visit (INDEPENDENT_AMBULATORY_CARE_PROVIDER_SITE_OTHER): Payer: 59 | Admitting: Internal Medicine

## 2010-09-21 ENCOUNTER — Encounter: Payer: Self-pay | Admitting: Internal Medicine

## 2010-09-21 VITALS — BP 102/82 | HR 86 | Temp 98.6°F | Wt 228.0 lb

## 2010-09-21 DIAGNOSIS — R7303 Prediabetes: Secondary | ICD-10-CM

## 2010-09-21 DIAGNOSIS — R7309 Other abnormal glucose: Secondary | ICD-10-CM

## 2010-09-21 DIAGNOSIS — I10 Essential (primary) hypertension: Secondary | ICD-10-CM

## 2010-09-21 DIAGNOSIS — E669 Obesity, unspecified: Secondary | ICD-10-CM

## 2010-09-21 NOTE — Progress Notes (Signed)
  Subjective:    Patient ID: Crystal Townsend, female    DOB: 1965-08-07, 45 y.o.   MRN: [id number]  HPI Pt comes in today for  Fu   Of  multiple medical issues  Since last visit no major changes in health but hasn't been as adherant    to healthy  lifestyle intervention healthy eating and exercise .   No infection  Vision change .    Review of Systems Neg cp sob   Fever bleeding  .     Past history family history social history reviewed in the electronic medical record.      Objective:   Physical Exam HEENT: Normocephalic ;atraumatic , Eyes;  PERRL, EOMs  Full, lids and conjunctiva clear,,Ears: no deformities, canals nl, TM landmarks normal, Nose: no deformity or discharge  Mouth : OP clear without lesion or edema . Chest:  Clear to A&P without wheezes rales or rhonchi CV:  S1-S2 no gallops or murmurs peripheral perfusion is normal Labs reviewed      Lab Results  Component Value Date   HGBA1C 6.6* 09/14/2010     Assessment & Plan:  Pre diabetes   Early diabetes range Counseled.     Importance of lsi    Reviewed poss meds if needed Counseled.   Has se of metformin and next step may be [place] or other. HT: stable   Obesity  As above  LIPIDS  About the same

## 2010-09-21 NOTE — Patient Instructions (Signed)
Continue intensify  lifestyle intervention healthy eating and exercise .  Check labs in 4 months and then recheck.

## 2010-09-25 ENCOUNTER — Encounter: Payer: Self-pay | Admitting: Internal Medicine

## 2010-09-26 DIAGNOSIS — R7303 Prediabetes: Secondary | ICD-10-CM | POA: Insufficient documentation

## 2010-10-27 ENCOUNTER — Telehealth: Payer: Self-pay | Admitting: Internal Medicine

## 2010-10-27 NOTE — Telephone Encounter (Signed)
[facility] ---[address] /allergy meds.

## 2010-10-27 NOTE — Telephone Encounter (Signed)
yes

## 2010-10-27 NOTE — Telephone Encounter (Signed)
These medications are over the counter. She wants to use these on her flex spending. Can we sent them to the pharmacy.

## 2010-10-27 NOTE — Telephone Encounter (Signed)
Refill eyedrops and allergy meds to

## 2010-11-01 MED ORDER — NAPHAZOLINE-PHENIRAMINE 0.027-0.315 % OP SOLN: OPHTHALMIC | Status: DC

## 2010-11-01 MED ORDER — CETIRIZINE HCL 10 MG PO TABS: 10.0000 mg | ORAL_TABLET | Freq: Every day | ORAL | Status: DC

## 2010-11-01 NOTE — Telephone Encounter (Signed)
rx sent to pharmacy

## 2010-11-05 ENCOUNTER — Other Ambulatory Visit: Payer: Self-pay | Admitting: Obstetrics and Gynecology

## 2010-11-05 DIAGNOSIS — Z1231 Encounter for screening mammogram for malignant neoplasm of breast: Secondary | ICD-10-CM

## 2010-11-11 ENCOUNTER — Ambulatory Visit
Admission: RE | Admit: 2010-11-11 | Discharge: 2010-11-11 | Disposition: A | Discharge status: Home / Self Care | Payer: 59 | Source: Ambulatory Visit | Attending: Obstetrics and Gynecology | Admitting: Obstetrics and Gynecology

## 2010-11-11 DIAGNOSIS — Z1231 Encounter for screening mammogram for malignant neoplasm of breast: Secondary | ICD-10-CM

## 2011-01-04 ENCOUNTER — Other Ambulatory Visit: Payer: 59

## 2011-01-11 ENCOUNTER — Other Ambulatory Visit: Payer: Self-pay | Admitting: Internal Medicine

## 2011-01-18 ENCOUNTER — Ambulatory Visit: Payer: 59 | Admitting: Internal Medicine

## 2011-02-04 ENCOUNTER — Other Ambulatory Visit (INDEPENDENT_AMBULATORY_CARE_PROVIDER_SITE_OTHER): Payer: 59

## 2011-02-04 DIAGNOSIS — E785 Hyperlipidemia, unspecified: Secondary | ICD-10-CM

## 2011-02-04 DIAGNOSIS — I10 Essential (primary) hypertension: Secondary | ICD-10-CM

## 2011-02-04 DIAGNOSIS — IMO0001 Reserved for inherently not codable concepts without codable children: Secondary | ICD-10-CM

## 2011-02-04 LAB — LIPID PANEL
HDL: 46.5 mg/dL (ref >39.00)
Total CHOL/HDL Ratio: 4
Triglycerides: 56 mg/dL (ref 0.0–149.0)
VLDL: 11.2 mg/dL (ref 0.0–40.0)

## 2011-02-04 LAB — BASIC METABOLIC PANEL
BUN: 11 mg/dL (ref 6–23)
Chloride: 104 mEq/L (ref 96–112)
GFR: 69.82 mL/min (ref >60.00)
Glucose, Bld: 112 mg/dL — ABNORMAL HIGH (ref 70–99)
Potassium: 4.2 mEq/L (ref 3.5–5.1)
Sodium: 141 mEq/L (ref 135–145)

## 2011-02-21 ENCOUNTER — Ambulatory Visit (INDEPENDENT_AMBULATORY_CARE_PROVIDER_SITE_OTHER): Payer: 59 | Admitting: Internal Medicine

## 2011-02-21 ENCOUNTER — Encounter: Payer: Self-pay | Admitting: Internal Medicine

## 2011-02-21 DIAGNOSIS — IMO0002 Reserved for concepts with insufficient information to code with codable children: Secondary | ICD-10-CM

## 2011-02-21 DIAGNOSIS — E669 Obesity, unspecified: Secondary | ICD-10-CM

## 2011-02-21 DIAGNOSIS — R7309 Other abnormal glucose: Secondary | ICD-10-CM

## 2011-02-21 DIAGNOSIS — J309 Allergic rhinitis, unspecified: Secondary | ICD-10-CM

## 2011-02-21 DIAGNOSIS — R7303 Prediabetes: Secondary | ICD-10-CM

## 2011-02-21 DIAGNOSIS — Z23 Encounter for immunization: Secondary | ICD-10-CM

## 2011-02-21 DIAGNOSIS — I10 Essential (primary) hypertension: Secondary | ICD-10-CM

## 2011-02-21 DIAGNOSIS — E785 Hyperlipidemia, unspecified: Secondary | ICD-10-CM

## 2011-02-21 MED ORDER — NAPHAZOLINE-PHENIRAMINE 0.027-0.315 % OP SOLN: OPHTHALMIC | Status: DC

## 2011-02-21 MED ORDER — CETIRIZINE HCL 10 MG PO TABS: 10.0000 mg | ORAL_TABLET | Freq: Every day | ORAL | Status: DC

## 2011-02-21 MED ORDER — SAXAGLIPTIN HCL 5 MG PO TABS: 5.0000 mg | ORAL_TABLET | Freq: Every day | ORAL | Status: DC

## 2011-02-21 MED ORDER — ZOLPIDEM TARTRATE 10 MG PO TABS: 10.0000 mg | ORAL_TABLET | Freq: Every evening | ORAL | Status: DC | PRN

## 2011-02-21 NOTE — Progress Notes (Signed)
  Subjective:    Patient ID: Crystal Townsend, female    DOB: 1965-11-14, 45 y.o.   MRN: [id number]  HPI Patient comes in today for follow up of  multiple medical problems.    Allergy zyrtec every day  At night   Want med refill for eyedrop eye doc says ok to continue gets  Itchy weepy eyes  No cough BP has been okl nose of meds  Blood sugars some exercise  Some depression not serious  . avoiding sugars  No infections  Balls of feet  Burn sometimes no ulcers ? Getting older doesn't wear heels on reg basis but wearing them today.  Sleep osa  Used ambien as needed not every night Review of Systems No cp sob vision change bleeding  abd pain   Past history family history social history reviewed in the electronic medical record.     Objective:   Physical Exam  Wt Readings from Last 3 Encounters:  02/21/11 232 lb (105.235 kg)  09/21/10 228 lb (103.42 kg)  05/24/10 233 lb (105.688 kg)   WDWN in and Neck supple no masses Chest:  Clear to A&P without wheezes rales or rhonchi CV:  S1-S2 no gallops or murmurs peripheral perfusion is normal reviewed labs  Lab Results  Component Value Date   HGBA1C 6.6* 02/04/2011   Lab Results  Component Value Date   CHOL 199 02/04/2011   CHOL 218* 09/14/2010   CHOL 213* 03/04/2010   Lab Results  Component Value Date   HDL 46.50 02/04/2011   HDL 40.98 09/14/2010   HDL 11.91 03/04/2010   Lab Results  Component Value Date   LDLCALC 141* 02/04/2011   LDLCALC 108* 02/12/2007   LDLCALC 125* 07/27/2006   Lab Results  Component Value Date   TRIG 56.0 02/04/2011   TRIG 63.0 09/14/2010   TRIG 75.0 03/04/2010   Lab Results  Component Value Date   CHOLHDL 4 02/04/2011   CHOLHDL 5 09/14/2010   CHOLHDL 5 03/04/2010   Lab Results  Component Value Date   LDLDIRECT 170.2 09/14/2010   LDLDIRECT 150.9 03/04/2010   LDLDIRECT 154.3 02/05/2009         Assessment & Plan:  Pre diabetes  About the same  Hg a1c i6.6  fbs in pre diabetes range flu shot  today LIPIDs slightly better  ldl ratio 4  Obesity the same HT stable Allergic  Inc eye findings?  Will refill for now Sleep OSA hx and intermittent use of ambien  Ok to refill   Total visit > 50% spent counseling and coordinating care

## 2011-02-21 NOTE — Patient Instructions (Signed)
Intensify lifestyle interventions. Weight loss Can begin a trial of onglyza once a day for sugar control. Call to call this in if you tolerated this ok.  return office visit in    2-3 month  Labs before this Wear proper fitting shoes to avoid feet problems

## 2011-04-11 ENCOUNTER — Other Ambulatory Visit: Payer: Self-pay | Admitting: Internal Medicine

## 2011-05-24 ENCOUNTER — Other Ambulatory Visit (INDEPENDENT_AMBULATORY_CARE_PROVIDER_SITE_OTHER): Payer: 59

## 2011-05-24 DIAGNOSIS — K219 Gastro-esophageal reflux disease without esophagitis: Secondary | ICD-10-CM

## 2011-05-24 LAB — HEMOGLOBIN A1C: Hgb A1c MFr Bld: 6.7 % — ABNORMAL HIGH (ref 4.6–6.5)

## 2011-06-06 ENCOUNTER — Ambulatory Visit (INDEPENDENT_AMBULATORY_CARE_PROVIDER_SITE_OTHER): Payer: 59 | Admitting: Internal Medicine

## 2011-06-06 ENCOUNTER — Other Ambulatory Visit: Payer: Self-pay | Admitting: Internal Medicine

## 2011-06-06 ENCOUNTER — Encounter: Payer: Self-pay | Admitting: Internal Medicine

## 2011-06-06 DIAGNOSIS — R7309 Other abnormal glucose: Secondary | ICD-10-CM

## 2011-06-06 DIAGNOSIS — E669 Obesity, unspecified: Secondary | ICD-10-CM

## 2011-06-06 DIAGNOSIS — F4321 Adjustment disorder with depressed mood: Secondary | ICD-10-CM | POA: Insufficient documentation

## 2011-06-06 DIAGNOSIS — E785 Hyperlipidemia, unspecified: Secondary | ICD-10-CM

## 2011-06-06 DIAGNOSIS — R5381 Other malaise: Secondary | ICD-10-CM

## 2011-06-06 DIAGNOSIS — I10 Essential (primary) hypertension: Secondary | ICD-10-CM

## 2011-06-06 DIAGNOSIS — F4323 Adjustment disorder with mixed anxiety and depressed mood: Secondary | ICD-10-CM

## 2011-06-06 DIAGNOSIS — M2550 Pain in unspecified joint: Secondary | ICD-10-CM | POA: Insufficient documentation

## 2011-06-06 DIAGNOSIS — R7303 Prediabetes: Secondary | ICD-10-CM

## 2011-06-06 NOTE — Patient Instructions (Addendum)
Aleve is good for arthritis pain but can interfere with  blood pressure control and medication to  monitor this. It is important that your blood pressure is controlled  130/80-range or lower.   Call your counselor about the depression symptoms . ASAP to be seen  .   They can make a recommendation about whether you should also see a psychiatrist about possible medication intervention. In addition to the counseling.  Call us with what med is preferred for your insurance. Onglyza ,Januvia and Trajenta. Call us and we will call in this prescriptions.   Plan Follow up  In 3 months with  Hg a1c /tsh and bmp  Pre visit .   You  may need to also  need to be on a cholesterol medication  And aspirin  Every day

## 2011-06-06 NOTE — Progress Notes (Signed)
  Subjective:    Patient ID: Crystal Townsend, female    DOB: 04-26-1965, 46 y.o.   MRN: [id number]  HPI Patient comes in today for follow up of  multiple medical problems.  Prediabetes /early diabetes  Trying to do diet   On onglyza but only taking 3 x per week. Mostly because she's not used to a diagnosis of early diabetes also only had samples. No side effects. Has tried to do dietary changes no major changes in vision unusual infections  Depressive symptoms Seeing counselor  About every 2 weeks .  Psychologist. To see this week but this weekend stayed in bed most of the weekend and thought about suicide but no active planning.   HT controlled no problems with medicine Obesity as above lifestyle.   Review of Systems Neg cp sob vision changes bleeding bruising new GI issues.   Past history family history social history reviewed in the electronic medical record.     Objective:   Physical Exam Wt Readings from Last 3 Encounters:  06/06/11 235 lb (106.595 kg)  02/21/11 232 lb (105.235 kg)  09/21/10 228 lb (103.42 kg)   WDWN in NAD  good eye contact well groomed in no acute distress Neck supple without bruits or adenopathy Chest CTA BS = Cardiac S1-S2 no gallops or murmurs negative CCE PSY oriented x 3  good eye contact moderate depressed affect with some animation normal thought process and attention    Lab Results  Component Value Date   HGBA1C 6.7* 05/24/2011       Assessment & Plan:  Prediabetes  /early diabetes.  Readings are about the same I suspect that depression is a factor but she is only taking onglyza  is a 3 times a week although is trying to do dietary intervention. We'll check with her insurance about the preferred preparation. She cannot tolerate metformin. Prefer to not add a medication that encourages weight gain. At some point will need to have lipid medicine if not controlled as well as other interventions at this point I feel that the depression should be  addressed first.    Depressive reaction with some suicidal thoughts this past weekend related to external factors she is in active counseling with a psychologist she will call them today she has an appointment this week no work today.  If there is possibility of medication per psychiatry or behavioral health. Currently she is not actively at risk. NOte to be out of work today  And she will call her counselor.  Hypertension controlled follow  Joint pains probably arthritis DJD can use Aleve but needs care with renal effects.

## 2011-06-07 ENCOUNTER — Telehealth: Payer: Self-pay | Admitting: Internal Medicine

## 2011-06-07 MED ORDER — SAXAGLIPTIN HCL 5 MG PO TABS: 5.0000 mg | ORAL_TABLET | Freq: Every day | ORAL | Status: DC

## 2011-06-07 NOTE — Telephone Encounter (Signed)
Pt called and said that the med she needs is Onglyza 5 mg 1 tab per day.  90 day supply through [facility].  [phone]

## 2011-07-10 ENCOUNTER — Other Ambulatory Visit: Payer: Self-pay | Admitting: Internal Medicine

## 2011-09-06 ENCOUNTER — Other Ambulatory Visit (INDEPENDENT_AMBULATORY_CARE_PROVIDER_SITE_OTHER): Payer: 59

## 2011-09-06 DIAGNOSIS — I10 Essential (primary) hypertension: Secondary | ICD-10-CM

## 2011-09-06 DIAGNOSIS — E785 Hyperlipidemia, unspecified: Secondary | ICD-10-CM

## 2011-09-06 DIAGNOSIS — R7309 Other abnormal glucose: Secondary | ICD-10-CM

## 2011-09-06 DIAGNOSIS — R5383 Other fatigue: Secondary | ICD-10-CM

## 2011-09-06 DIAGNOSIS — R5381 Other malaise: Secondary | ICD-10-CM

## 2011-09-06 LAB — BASIC METABOLIC PANEL
Calcium: 9.1 mg/dL (ref 8.4–10.5)
GFR: 73.56 mL/min (ref >60.00)
Glucose, Bld: 125 mg/dL — ABNORMAL HIGH (ref 70–99)
Potassium: 3.8 mEq/L (ref 3.5–5.1)
Sodium: 142 mEq/L (ref 135–145)

## 2011-09-06 LAB — LIPID PANEL
Cholesterol: 191 mg/dL (ref 0–200)
LDL Cholesterol: 120 mg/dL — ABNORMAL HIGH (ref 0–99)

## 2011-09-06 LAB — HEMOGLOBIN A1C: Hgb A1c MFr Bld: 6.6 % — ABNORMAL HIGH (ref 4.6–6.5)

## 2011-09-17 ENCOUNTER — Other Ambulatory Visit: Payer: Self-pay | Admitting: Internal Medicine

## 2011-09-19 ENCOUNTER — Other Ambulatory Visit: Payer: Self-pay | Admitting: Family Medicine

## 2011-09-19 MED ORDER — TELMISARTAN-HCTZ 80-12.5 MG PO TABS: 1.0000 | ORAL_TABLET | Freq: Every day | ORAL | Status: DC

## 2011-09-19 NOTE — Telephone Encounter (Signed)
Sent to [id number] with 0 refills.  Pt has pending appt on 09/26/11 with Dr. Fabian Sharp.

## 2011-09-26 ENCOUNTER — Ambulatory Visit (INDEPENDENT_AMBULATORY_CARE_PROVIDER_SITE_OTHER): Payer: 59 | Admitting: Internal Medicine

## 2011-09-26 ENCOUNTER — Encounter: Payer: Self-pay | Admitting: Internal Medicine

## 2011-09-26 VITALS — BP 120/84 | HR 76 | Temp 98.3°F | Wt 234.0 lb

## 2011-09-26 DIAGNOSIS — E785 Hyperlipidemia, unspecified: Secondary | ICD-10-CM

## 2011-09-26 DIAGNOSIS — F4323 Adjustment disorder with mixed anxiety and depressed mood: Secondary | ICD-10-CM

## 2011-09-26 DIAGNOSIS — E119 Type 2 diabetes mellitus without complications: Secondary | ICD-10-CM

## 2011-09-26 DIAGNOSIS — I10 Essential (primary) hypertension: Secondary | ICD-10-CM

## 2011-09-26 NOTE — Patient Instructions (Addendum)
Begin  aspirin 81 mg per day.   The sweet tea will raise  your blood sugar  And triglycerides and help ou gain weight and has no good  nutritional   qualitites . Increase physical activity.   Can wait on adding cholesterol medication. Goal is to get the LDL to below 100.   Recheck in 4 months .  Labs before visit.

## 2011-09-26 NOTE — Progress Notes (Signed)
  Subjective:    Patient ID: Crystal Townsend, female    DOB: [birth year], 46 y.o.   MRN: [id number]  HPI Patient comes in today for follow up of  multiple medical problems.  Since last visit she is now is counselign and group related to her teen son. Doing better no need for meds. Trying to eat better and feels better. No cp sob some right axillary discomfort and no lump  To go to her gyne soon.No bleeding infections . Change in status. Taking onglyza q d . driks sweet tea 3 x per day at each meal and when she is thirsty.  Added green coffee bean? Extract  Other supp such as biotin Review of Systems No syncope infection new numbness and vision change  Past history family history social history reviewed in the electronic medical record.    Objective:   Physical Exam BP 120/84  Pulse 76  Temp 98.3 F (36.8 C) (Oral)  Wt 234 lb (106.142 kg)  SpO2 95% Wt Readings from Last 3 Encounters:  09/26/11 234 lb (106.142 kg)  06/06/11 235 lb (106.595 kg)  02/21/11 232 lb (105.235 kg)   WDWN in nad Oriented x 3. Normal cognition, attention, speech. Not anxious or depressed appearing   Good eye contact .   Lab Results  Component Value Date   WBC 6.5 03/04/2010   HGB 12.3 03/04/2010   HCT 36.7 03/04/2010   PLT 377.0 03/04/2010   GLUCOSE 125* 09/06/2011   CHOL 191 09/06/2011   TRIG 129.0 09/06/2011   HDL 44.80 09/06/2011   LDLDIRECT 170.2 09/14/2010   LDLCALC 120* 09/06/2011   ALT 13 03/04/2010   AST 18 03/04/2010   NA 142 09/06/2011   K 3.8 09/06/2011   CL 105 09/06/2011   CREATININE 1.0 09/06/2011   BUN 10 09/06/2011   CO2 28 09/06/2011   TSH 1.69 09/06/2011   HGBA1C 6.6* 09/06/2011   MICROALBUR 1.2 02/05/2009       Assessment & Plan:  DM  The same   Down .1  But  Mood  Is much better ,. Disc sweet tea and that no supplement or lsi can counteract this for diabetes control  . Depression   Situational  And in support group and  Doing much better.  LIPIDS;   Better wants to wait on Chol medication for now and  do more lsi. BP   Ok Obesity the same. Inc weight loss stop the sweet tea.  Add asa 81 mg   Total visit > 50% spent counseling and coordinating care     a1c and lipid in 3-4 months and fu

## 2011-11-02 ENCOUNTER — Other Ambulatory Visit: Payer: Self-pay | Admitting: Obstetrics and Gynecology

## 2011-11-02 DIAGNOSIS — Z1231 Encounter for screening mammogram for malignant neoplasm of breast: Secondary | ICD-10-CM

## 2011-11-14 ENCOUNTER — Ambulatory Visit (INDEPENDENT_AMBULATORY_CARE_PROVIDER_SITE_OTHER): Payer: 59 | Admitting: Family Medicine

## 2011-11-14 ENCOUNTER — Encounter: Payer: Self-pay | Admitting: Family Medicine

## 2011-11-14 VITALS — BP 120/72 | Temp 98.8°F | Wt 234.0 lb

## 2011-11-14 DIAGNOSIS — J069 Acute upper respiratory infection, unspecified: Secondary | ICD-10-CM

## 2011-11-14 NOTE — Progress Notes (Signed)
  Subjective:    Patient ID: Crystal Townsend, female    DOB: 1965-04-08, 46 y.o.   MRN: [id number]  HPI  Acute visit. Bodyaches, chills, low-grade fever. Onset Saturday 2 days ago. Also laryngitis symptoms. She has cough which is dry. She's had some nasal congestion and right earache. Nonsmoker. No nausea or vomiting. She tried Advil and some other over-the-counter medications without relief  Review of Systems  Constitutional: Positive for fever, chills and fatigue.  HENT: Positive for congestion, voice change and sinus pressure.   Respiratory: Positive for cough.        Objective:   Physical Exam  Constitutional: She appears well-developed and well-nourished.  HENT:  Right Ear: External ear normal.  Left Ear: External ear normal.  Mouth/Throat: Oropharynx is clear and moist.  Neck: Neck supple. No thyromegaly present.  Cardiovascular: Normal rate and regular rhythm.   Pulmonary/Chest: Effort normal and breath sounds normal. No respiratory distress. She has no wheezes. She has no rales.  Lymphadenopathy:    She has no cervical adenopathy.          Assessment & Plan:  Viral URI. Cautious use of Mucinex D for congestive symptoms. Monitor blood pressure closely. Plenty of fluids. Work note written for today and tomorrow

## 2011-11-14 NOTE — Patient Instructions (Addendum)
Upper Respiratory Infection, Adult An upper respiratory infection (URI) is also known as the common cold. It is often caused by a type of germ (virus). Colds are easily spread (contagious). You can pass it to others by kissing, coughing, sneezing, or drinking out of the same glass. Usually, you get better in 1 or 2 weeks.  HOME CARE   Only take medicine as told by your doctor.   Use a warm mist humidifier or breathe in steam from a hot shower.   Drink enough water and fluids to keep your pee (urine) clear or pale yellow.   Get plenty of rest.   Return to work when your temperature is back to normal or as told by your doctor. You may use a face mask and wash your hands to stop your cold from spreading.  GET HELP RIGHT AWAY IF:   After the first few days, you feel you are getting worse.   You have questions about your medicine.   You have chills, shortness of breath, or brown or red spit (mucus).   You have yellow or brown snot (nasal discharge) or pain in the face, especially when you bend forward.   You have a fever, puffy (swollen) neck, pain when you swallow, or white spots in the back of your throat.   You have a bad headache, ear pain, sinus pain, or chest pain.   You have a high-pitched whistling sound when you breathe in and out (wheezing).   You have a lasting cough or cough up blood.   You have sore muscles or a stiff neck.  MAKE SURE YOU:   Understand these instructions.   Will watch your condition.   Will get help right away if you are not doing well or get worse.  Document Released: 09/07/2007 Document Revised: 03/10/2011 Document Reviewed: 07/26/2010 [facility] Patient Information 2012 [place], [place].  Try over the counter Mucinex D for congestion symptoms.

## 2011-11-18 ENCOUNTER — Ambulatory Visit
Admission: RE | Admit: 2011-11-18 | Discharge: 2011-11-18 | Disposition: A | Discharge status: Home / Self Care | Payer: 59 | Source: Ambulatory Visit | Attending: Obstetrics and Gynecology | Admitting: Obstetrics and Gynecology

## 2011-11-18 DIAGNOSIS — Z1231 Encounter for screening mammogram for malignant neoplasm of breast: Secondary | ICD-10-CM

## 2011-11-28 ENCOUNTER — Encounter: Payer: Self-pay | Admitting: Obstetrics and Gynecology

## 2011-11-28 ENCOUNTER — Ambulatory Visit (INDEPENDENT_AMBULATORY_CARE_PROVIDER_SITE_OTHER): Payer: 59 | Admitting: Obstetrics and Gynecology

## 2011-11-28 VITALS — BP 116/72 | HR 80 | Ht 65.5 in | Wt 231.0 lb

## 2011-11-28 DIAGNOSIS — Z124 Encounter for screening for malignant neoplasm of cervix: Secondary | ICD-10-CM

## 2011-11-28 DIAGNOSIS — Z01419 Encounter for gynecological examination (general) (routine) without abnormal findings: Secondary | ICD-10-CM

## 2011-11-28 NOTE — Progress Notes (Signed)
Regular Periods: no  iud Mammogram: yes  Monthly Breast Ex.: yes Exercise: yes  Tetanus < 10 years: yes Seatbelts: yes  NI. Bladder Functn.: yes Abuse at home: no  Daily BM's: yes Stressful Work: no  Healthy Diet: yes Sigmoid-Colonoscopy: 2007 nl  Calcium: yes Medical problems this year: no problems   LAST PAP:5/12  Contraception: IUD MIRENA  Mammogram:  11/18/11  PCP: DR. PANOSH  PMH: NO CHANGE  FMH: NO CHANGE  Last Bone Scan: NO

## 2011-11-28 NOTE — Progress Notes (Signed)
Subjective:    Crystal Townsend is a 47 y.o. female, G1P1, who presents for an annual exam. The patient has no complaints.  Menstrual cycle:   LMP: No LMP recorded. Patient is not currently having periods (Reason: IUD).             Review of Systems Pertinent items are noted in HPI. Denies pelvic pain, urinary tract symptoms, vaginitis symptoms, irregular bleeding, menopausal symptoms, change in bowel habits or rectal bleeding   Objective:    BP 116/72  Pulse 80  Ht 5' 5.5" (1.664 m)  Wt 231 lb (104.781 kg)  BMI 37.86 kg/m2    Wt Readings from Last 1 Encounters:  11/28/11 231 lb (104.781 kg)   Body mass index is 37.86 kg/(m^2). General Appearance: Alert, no acute distress HEENT: Grossly normal Neck / Thyroid: Supple, no thyromegaly or cervical adenopathy Lungs: Clear to auscultation bilaterally Back: No CVA tenderness Breast Exam: No masses or nodes.No dimpling, nipple retraction or discharge. Cardiovascular: Regular rate and rhythm.  Gastrointestinal: Soft, non-tender, no masses or organomegaly Pelvic Exam: EGBUS-wnl, vagina-normal rugae, cervix- without lesions or tenderness, string visible,  uterus appears normal size shape and consistency though exam limited by habitus, adnexae-no masses or tenderness Rectovaginal: no masses and normal sphincter tone Lymphatic Exam: Non-palpable nodes in neck, clavicular,  axillary, or inguinal regions  Skin: no rashes or abnormalities Extremities: no clubbing cyanosis or edema  Neurologic: grossly normal Psychiatric: Alert and oriented  Assessment:   Routine GYN Exam   Plan:    PAP sent RTO 1 year or prn  Glynna Failla,[email or web address]-C

## 2011-11-29 LAB — PAP IG W/ RFLX HPV ASCU

## 2011-12-16 ENCOUNTER — Other Ambulatory Visit: Payer: Self-pay | Admitting: Internal Medicine

## 2012-01-11 ENCOUNTER — Other Ambulatory Visit: Payer: Self-pay | Admitting: Family Medicine

## 2012-01-11 ENCOUNTER — Other Ambulatory Visit (INDEPENDENT_AMBULATORY_CARE_PROVIDER_SITE_OTHER): Payer: 59

## 2012-01-11 DIAGNOSIS — IMO0001 Reserved for inherently not codable concepts without codable children: Secondary | ICD-10-CM

## 2012-01-11 DIAGNOSIS — E785 Hyperlipidemia, unspecified: Secondary | ICD-10-CM

## 2012-01-11 LAB — LDL CHOLESTEROL, DIRECT: Direct LDL: 147.2 mg/dL

## 2012-01-11 LAB — HEMOGLOBIN A1C: Hgb A1c MFr Bld: 6.6 % — ABNORMAL HIGH (ref 4.6–6.5)

## 2012-01-11 LAB — LIPID PANEL
HDL: 41.3 mg/dL (ref >39.00)
Total CHOL/HDL Ratio: 5

## 2012-01-11 MED ORDER — NAPHAZOLINE-PHENIRAMINE 0.027-0.315 % OP SOLN: OPHTHALMIC | Status: DC

## 2012-01-26 ENCOUNTER — Ambulatory Visit (INDEPENDENT_AMBULATORY_CARE_PROVIDER_SITE_OTHER): Payer: 59 | Admitting: Internal Medicine

## 2012-01-26 ENCOUNTER — Encounter: Payer: Self-pay | Admitting: Internal Medicine

## 2012-01-26 VITALS — BP 104/78 | HR 84 | Temp 98.5°F | Wt 235.0 lb

## 2012-01-26 DIAGNOSIS — F4321 Adjustment disorder with depressed mood: Secondary | ICD-10-CM

## 2012-01-26 DIAGNOSIS — E669 Obesity, unspecified: Secondary | ICD-10-CM

## 2012-01-26 DIAGNOSIS — E119 Type 2 diabetes mellitus without complications: Secondary | ICD-10-CM

## 2012-01-26 DIAGNOSIS — I10 Essential (primary) hypertension: Secondary | ICD-10-CM

## 2012-01-26 DIAGNOSIS — E785 Hyperlipidemia, unspecified: Secondary | ICD-10-CM

## 2012-01-26 MED ORDER — ASPIRIN 81 MG PO TABS: 81.0000 mg | ORAL_TABLET | Freq: Every day | ORAL | Status: DC

## 2012-01-26 MED ORDER — TELMISARTAN-HCTZ 80-12.5 MG PO TABS: 1.0000 | ORAL_TABLET | Freq: Every day | ORAL | Status: DC

## 2012-01-26 MED ORDER — ATORVASTATIN CALCIUM 10 MG PO TABS: 10.0000 mg | ORAL_TABLET | Freq: Every day | ORAL | Status: DC

## 2012-01-26 MED ORDER — SAXAGLIPTIN HCL 5 MG PO TABS: 5.0000 mg | ORAL_TABLET | Freq: Every day | ORAL | Status: DC

## 2012-01-26 NOTE — Assessment & Plan Note (Signed)
Lab Results  Component Value Date   HGBA1C 6.6* 01/11/2012

## 2012-01-26 NOTE — Assessment & Plan Note (Addendum)
Lab Results  Component Value Date   CHOL 201* 01/11/2012   HDL 41.30 01/11/2012   LDLCALC 120* 09/06/2011   LDLDIRECT 147.2 01/11/2012   TRIG 103.0 01/11/2012   CHOLHDL 5 01/11/2012  add lipitor  And fu 3 months

## 2012-01-26 NOTE — Patient Instructions (Signed)
Continue lifestyle intervention healthy eating and exercise . i advise   Lipid medication  .   For the cholesterol .    Contact  us if cannot proceed with this.   Ov in about 3 months  With labs pre visit  Lipid and hg a1c.

## 2012-01-26 NOTE — Progress Notes (Signed)
  Subjective:    Patient ID: Crystal Townsend, female    DOB: 11/02/1965, 46 y.o.   MRN: [id number]  HPI Patient comes in today for follow up of  multiple medical problems.  Issues with son problems  Counseling   losing weight  As per her scale .  ocass forgets medication    Review of Systems No fever cp sob infections neuro sx change   gi status  Ra sh  Rest as above  Past history family history social history reviewed in the electronic medical record. Med list reviewed  No se noted    Objective:   Physical Exam BP 104/78  Pulse 84  Temp 98.5 F (36.9 C) (Oral)  Wt 235 lb (106.595 kg)  SpO2 98% Wt Readings from Last 3 Encounters:  01/26/12 235 lb (106.595 kg)  11/28/11 231 lb (104.781 kg)  11/14/11 234 lb (106.142 kg)  WDWN in nad  Heent grossly  normal Chest:  Clear to A&P without wheezes rales or rhonchi CV:  S1-S2 no gallops or murmurs peripheral perfusion is normal Oriented x 3 and no noted deficits in memory, attention, and [email or web address] nl speech  Lab Results  Component Value Date   WBC 6.5 03/04/2010   HGB 12.3 03/04/2010   HCT 36.7 03/04/2010   PLT 377.0 03/04/2010   GLUCOSE 125* 09/06/2011   CHOL 201* 01/11/2012   TRIG 103.0 01/11/2012   HDL 41.30 01/11/2012   LDLDIRECT 147.2 01/11/2012   LDLCALC 120* 09/06/2011   ALT 13 03/04/2010   AST 18 03/04/2010   NA 142 09/06/2011   K 3.8 09/06/2011   CL 105 09/06/2011   CREATININE 1.0 09/06/2011   BUN 10 09/06/2011   CO2 28 09/06/2011   TSH 1.69 09/06/2011   HGBA1C 6.6* 01/11/2012   MICROALBUR 1.2 02/05/2009      Assessment & Plan:   DM   No obv complications  Stable  At risk  LIPIDS better but not as bad.  Disc risk benefit of meds  Hesitant to add med but will try for now   lipitor 10 qd and fu labs in 3 months Add asa  81 mg BP  Controlled  On repeat forgets med at times Obesity   Ongoing  Intensify lifestyle interventions. irreg use of med.  Family stress   Disc strategies support  And self care. Importance. Had flu  vaccine Total visit > 50% spent counseling and coordinating care

## 2012-04-12 ENCOUNTER — Other Ambulatory Visit (INDEPENDENT_AMBULATORY_CARE_PROVIDER_SITE_OTHER): Payer: 59

## 2012-04-12 DIAGNOSIS — IMO0001 Reserved for inherently not codable concepts without codable children: Secondary | ICD-10-CM

## 2012-04-12 DIAGNOSIS — E785 Hyperlipidemia, unspecified: Secondary | ICD-10-CM

## 2012-04-12 LAB — LIPID PANEL
LDL Cholesterol: 115 mg/dL — ABNORMAL HIGH (ref 0–99)
VLDL: 21.8 mg/dL (ref 0.0–40.0)

## 2012-04-12 LAB — HEPATIC FUNCTION PANEL
ALT: 16 U/L (ref 0–35)
Bilirubin, Direct: 0 mg/dL (ref 0.0–0.3)
Total Bilirubin: 0.6 mg/dL (ref 0.3–1.2)

## 2012-04-23 ENCOUNTER — Ambulatory Visit (INDEPENDENT_AMBULATORY_CARE_PROVIDER_SITE_OTHER): Payer: 59 | Admitting: Internal Medicine

## 2012-04-23 ENCOUNTER — Encounter: Payer: Self-pay | Admitting: Internal Medicine

## 2012-04-23 VITALS — BP 120/80 | HR 91 | Temp 98.6°F | Wt 237.0 lb

## 2012-04-23 DIAGNOSIS — F4541 Pain disorder exclusively related to psychological factors: Secondary | ICD-10-CM

## 2012-04-23 DIAGNOSIS — I10 Essential (primary) hypertension: Secondary | ICD-10-CM

## 2012-04-23 DIAGNOSIS — J309 Allergic rhinitis, unspecified: Secondary | ICD-10-CM

## 2012-04-23 DIAGNOSIS — E119 Type 2 diabetes mellitus without complications: Secondary | ICD-10-CM

## 2012-04-23 DIAGNOSIS — G44209 Tension-type headache, unspecified, not intractable: Secondary | ICD-10-CM

## 2012-04-23 DIAGNOSIS — E785 Hyperlipidemia, unspecified: Secondary | ICD-10-CM

## 2012-04-23 DIAGNOSIS — F438 Other reactions to severe stress: Secondary | ICD-10-CM

## 2012-04-23 DIAGNOSIS — F4329 Adjustment disorder with other symptoms: Secondary | ICD-10-CM

## 2012-04-23 MED ORDER — BUPROPION HCL ER (XL) 150 MG PO TB24: 150.0000 mg | ORAL_TABLET | Freq: Every day | ORAL | Status: DC

## 2012-04-23 MED ORDER — ACETAMINOPHEN 325 MG PO TABS: 650.0000 mg | ORAL_TABLET | Freq: Three times a day (TID) | ORAL | Status: DC | PRN

## 2012-04-23 MED ORDER — CETIRIZINE HCL 10 MG PO TABS: 10.0000 mg | ORAL_TABLET | Freq: Every day | ORAL | Status: DC

## 2012-04-23 MED ORDER — NAPHAZOLINE-PHENIRAMINE 0.027-0.315 % OP SOLN: OPHTHALMIC | Status: DC

## 2012-04-23 NOTE — Progress Notes (Signed)
Chief Complaint  Patient presents with  . Follow-up    HPI: Patient comes in today for follow up of  multiple medical problems.  Stress   with her situation with her son has been very difficult. So she hasn't been exercising. No low blood sugars she is aware which needs to bring them down she still enjoys school but feels a bit depressed about the other situation. She does have some social supports. Asks for refills for her flex for allergy medicines pills and eye drops and also Tylenol as needed for headaches. She takes it no more than a few times a week but would like to use it on her flex card ROS: See pertinent positives and negatives per HPI. No chest pain shortness of breath has some aching in her left armpit but no rash fever. Sleep is better when she takes the uncle is on a regular basis  Past Medical History  Diagnosis Date  . Fatigue   . Snoring     had a sleep study mild osa 2009  . OSA (obstructive sleep apnea)   . Other dysfunctions of sleep stages or arousal from sleep   . Chest pain   . Impaired fasting glucose   . Hypertension   . Knee pain, bilateral   . Allergic rhinitis   . GERD (gastroesophageal reflux disease)   . Anxiety   . Leukoplakia oral mucosa     2011  . IBS (irritable bowel syndrome)   . Menorrhagia   . Fibroids   . Dysmenorrhea   . Diabetes mellitus     Family History  Problem Relation Age of Onset  . Lung cancer Mother   . Cancer Mother     lung   . Prostate cancer Father   . Cancer Father     prostate  . Diabetes Father   . Hyperlipidemia Father   . Hypertension Father   . Asthma Father   . Cancer Maternal Aunt     breast  . Cancer Maternal Aunt     breast  . Cancer Maternal Aunt     breast    History   Social History  . Marital Status: Married    Spouse Name: N/A    Number of Children: N/A  . Years of Education: N/A   Social History Main Topics  . Smoking status: Never Smoker   . Smokeless tobacco: Never Used  . Alcohol  Use: Yes     Comment: social  . Drug Use: No  . Sexually Active: Not Currently    Birth Control/ Protection: IUD     Comment: mirena   Other Topics Concern  . 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 aspirin 81 MG tablet Take 1 tablet (81 mg total) by mouth daily.  100 tablet  3  . atorvastatin (LIPITOR) 10 MG tablet Take 1 tablet (10 mg total) by mouth daily.  30 tablet  3  . Biotin 1000 MCG tablet Take 1,000 mcg by mouth 3 (three) times daily.      . cetirizine (ZYRTEC) 10 MG tablet Take 1 tablet (10 mg total) by mouth daily.  30 tablet  11  . Eflornithine HCl (VANIQA) 13.9 % cream Apply topically 2 (two) times daily with a  meal.        . HYDROcodone-acetaminophen (NORCO) 7.5-325 MG per tablet Take 1 tablet by mouth every 6 (six) hours as needed. 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 levonorgestrel (MIRENA) 20 MCG/24HR IUD 1 each by Intrauterine route once.        . Multiple Vitamins-Minerals (HAIR/SKIN/NAILS PO) Take by mouth.        . Naphazoline-Pheniramine (OPCON-A) 0.027-0.315 % SOLN Use as directed to eye  1 Bottle  2  . Probiotic Product (PROBIOTIC FORMULA PO) Take by mouth.        . saxagliptin HCl (ONGLYZA) 5 MG TABS tablet Take 1 tablet (5 mg total) by mouth daily.  90 tablet  3  . telmisartan-hydrochlorothiazide (MICARDIS HCT) 80-12.5 MG per tablet Take 1 tablet by mouth daily.  90 tablet  3  . zolpidem (AMBIEN) 10 MG tablet Take 1 tablet (10 mg total) by mouth at bedtime as needed.  30 tablet  1  . [DISCONTINUED] cetirizine (ZYRTEC) 10 MG tablet Take 1 tablet (10 mg total) by mouth daily.  30 tablet  11  . [DISCONTINUED] Naphazoline-Pheniramine (OPCON-A) 0.027-0.315 % SOLN Use as directed to eye  1  Bottle  2  . acetaminophen (TYLENOL) 325 MG tablet Take 2 tablets (650 mg total) by mouth every 8 (eight) hours as needed for pain (headache).  100 tablet  2  . buPROPion (WELLBUTRIN XL) 150 MG 24 hr tablet Take 1 tablet (150 mg total) by mouth daily. Increase to 2 per day after a week or as directed  60 tablet  2    EXAM:  BP 120/80  Pulse 91  Temp 98.6 F (37 C) (Oral)  Wt 237 lb (107.502 kg)  SpO2 97%  There is no height on file to calculate BMI. Wt Readings from Last 3 Encounters:  04/23/12 237 lb (107.502 kg)  01/26/12 235 lb (106.595 kg)  11/28/11 231 lb (104.781 kg)    GENERAL: vitals reviewed and listed above, alert, oriented, appears well hydrated and in no acute distress tearful at times alert cognitively intact  HEENT: atraumatic, conjunctiva  clear, no obvious abnormalities on inspection of external nose and ears OP : no lesion edema or exudate   NECK: no obvious masses on inspection palpation no adenopathy  LUNGS: clear to auscultation bilaterally, no wheezes, rales or rhonchi, good air movement Left axilla small possibly skin cyst area no redness fluctuance no adenopathy. CV: HRRR, no clubbing cyanosis or  peripheral edema nl cap refill   MS: moves all extremities without noticeable focal  abnormality  PSYCH: pleasant and cooperative, no  anxiety tearful at times somewhat sad good eye contact. Good insight. Lab Results  Component Value Date   WBC 6.5 03/04/2010   HGB 12.3 03/04/2010   HCT 36.7 03/04/2010   PLT 377.0 03/04/2010   GLUCOSE 125* 09/06/2011   CHOL 175 04/12/2012   TRIG 109.0 04/12/2012   HDL 38.60* 04/12/2012   LDLDIRECT 147.2 01/11/2012   LDLCALC 115* 04/12/2012   ALT 16 04/12/2012   AST 19 04/12/2012   NA 142 09/06/2011   K 3.8 09/06/2011   CL 105 09/06/2011   CREATININE 1.0 09/06/2011   BUN 10 09/06/2011   CO2 28 09/06/2011   TSH 1.69 09/06/2011   HGBA1C 7.6* 04/12/2012   MICROALBUR 1.2 02/05/2009    ASSESSMENT AND PLAN:  Discussed the following assessment and  plan:  1. Diabetes mellitus, type 2  Worsening control patient feels her lifestyle doesn't want to add another medicine once at exercise treatment for depression stress reaction  2. Stress and adjustment reaction    5 consider seeing counselor in addition to above   3. HYPERTENSION   4. Other and unspecified hyperlipidemia    On meds  5. Stress headaches   6. Allergic rhinitis, cause unspecified    Refill eyedrops and Zyrtec.   other options for diabetes intervention were discussed but she doesn't want to do this right now including Actos or switching to the toes etc. adding a sulfonylurea. She plans on walking everyday or every other day paying attention more to her diabetes. We'll also add antidepression treatment as a trial on followup. Okay to use Tylenol as needed limits to avoid rebound patient aware prescriptions written for her flex -Patient advised to return or notify health care team  immediately if symptoms worsen or persist or new concerns arise.  Patient Instructions  Intensify lifestyle interventions. As we discussed .  Hg a1c and bmp in 3 months and then ROV.  Trial  wellbutrin 150 per day and increase to 2 per day after 1 week or so .   Neta Mends. Buna Cuppett M.D.

## 2012-04-23 NOTE — Patient Instructions (Signed)
Intensify lifestyle interventions. As we discussed .  Hg a1c and bmp in 3 months and then ROV.  Trial  wellbutrin 150 per day and increase to 2 per day after 1 week or so .

## 2012-05-22 ENCOUNTER — Ambulatory Visit (INDEPENDENT_AMBULATORY_CARE_PROVIDER_SITE_OTHER): Payer: 59 | Admitting: Internal Medicine

## 2012-05-22 ENCOUNTER — Encounter: Payer: Self-pay | Admitting: Internal Medicine

## 2012-05-22 VITALS — BP 120/80 | HR 92 | Temp 98.3°F | Wt 241.0 lb

## 2012-05-22 DIAGNOSIS — R071 Chest pain on breathing: Secondary | ICD-10-CM

## 2012-05-22 DIAGNOSIS — E119 Type 2 diabetes mellitus without complications: Secondary | ICD-10-CM

## 2012-05-22 DIAGNOSIS — R0789 Other chest pain: Secondary | ICD-10-CM

## 2012-05-22 DIAGNOSIS — F4323 Adjustment disorder with mixed anxiety and depressed mood: Secondary | ICD-10-CM

## 2012-05-22 NOTE — Patient Instructions (Signed)
The medication works better if take it every day.   Most people get a better effect from 300 mg per day but can take 150 mg per day  If you want to try this.  Contact for refills.   Done feel a lump but can get your gyne  to check out the sore area.   Keep appt  With labs in MAY.

## 2012-05-22 NOTE — Progress Notes (Signed)
Chief Complaint  Patient presents with  . Follow-up    med check   . Diabetes    HPI: Patient comes in today for follow up of  multiple medical problems.  Mostly mood depression.  Has been trying to take wellbutrin  As needed and seems to help with mood   Has not been taking reg concern again about taking multiple meds  . Still wants to work on  [employer] fo r  dm   No se of med when taken ROS: See pertinent positives and negatives per HPI. Has some shar p soresness left axilla breast area no injury lump and not cyclical   Past Medical History  Diagnosis Date  . Fatigue   . Snoring     had a sleep study mild osa 2009  . OSA (obstructive sleep apnea)   . Other dysfunctions of sleep stages or arousal from sleep   . Chest pain   . Impaired fasting glucose   . Hypertension   . Knee pain, bilateral   . Allergic rhinitis   . GERD (gastroesophageal reflux disease)   . Anxiety   . Leukoplakia oral mucosa     2011  . IBS (irritable bowel syndrome)   . Menorrhagia   . Fibroids   . Dysmenorrhea   . Diabetes mellitus     Family History  Problem Relation Age of Onset  . Lung cancer Mother   . Cancer Mother     lung   . Prostate cancer Father   . Cancer Father     prostate  . Diabetes Father   . Hyperlipidemia Father   . Hypertension Father   . Asthma Father   . Cancer Maternal Aunt     breast  . Cancer Maternal Aunt     breast  . Cancer Maternal Aunt     breast    History   Social History  . Marital Status: Married    Spouse Name: N/A    Number of Children: N/A  . Years of Education: N/A   Social History Main Topics  . Smoking status: Never Smoker   . Smokeless tobacco: Never Used  . Alcohol Use: Yes     Comment: social  . Drug Use: No  . Sexually Active: Not Currently    Birth Control/ Protection: IUD     Comment: mirena   Other Topics Concern  . 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 acetaminophen (TYLENOL) 325 MG tablet Take 2 tablets (650 mg total) by mouth every 8 (eight) hours as needed for pain (headache).  100 tablet  2  . aspirin 81 MG tablet Take 1 tablet (81 mg total) by mouth daily.  100 tablet  3  . atorvastatin (LIPITOR) 10 MG tablet Take 1 tablet (10 mg total) by mouth daily.  30 tablet  3  . Biotin 1000 MCG tablet Take 1,000 mcg by mouth 3 (three) times daily.      Marland Kitchen buPROPion (WELLBUTRIN XL) 150 MG 24 hr tablet Take 1 tablet (150 mg total) by mouth daily. Increase to 2 per day after a week or as directed  60 tablet  2  .  cetirizine (ZYRTEC) 10 MG tablet Take 1 tablet (10 mg total) by mouth daily.  30 tablet  11  . Eflornithine HCl (VANIQA) 13.9 % cream Apply topically 2 (two) times daily with a meal.        . HYDROcodone-acetaminophen (NORCO) 7.5-325 MG per tablet Take 1 tablet by mouth every 6 (six) hours as needed. 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 levonorgestrel (MIRENA) 20 MCG/24HR IUD 1 each by Intrauterine route once.        . Multiple Vitamins-Minerals (HAIR/SKIN/NAILS PO) Take by mouth.        . Naphazoline-Pheniramine (OPCON-A) 0.027-0.315 % SOLN Use as directed to eye  1 Bottle  2  . Probiotic Product (PROBIOTIC FORMULA PO) Take by mouth.        . saxagliptin HCl (ONGLYZA) 5 MG TABS tablet Take 1 tablet (5 mg total) by mouth daily.  90 tablet  3  . telmisartan-hydrochlorothiazide (MICARDIS HCT) 80-12.5 MG per tablet Take 1 tablet by mouth daily.  90 tablet  3  . zolpidem (AMBIEN) 10 MG tablet Take 1 tablet (10 mg total) by mouth at bedtime as needed.  30 tablet  1   No facility-administered encounter medications on file as of 05/22/2012.    EXAM:  BP 120/80  Pulse 92  Temp(Src) 98.3 F (36.8 C) (Oral)  Wt 241 lb  (109.317 kg)  BMI 39.48 kg/m2  SpO2 96%  Body mass index is 39.48 kg/(m^2).  GENERAL: vitals reviewed and listed above, alert, oriented, appears well hydrated and in no acute distress  HEENT: atraumatic, conjunctiva  clear, no obvious abnormalities on inspection of external nose and ears OP : no lesion edema or exudate   NECK: no obvious masses on inspection palpation   Left breast axilla area  No nodule   Point of tenderness is rib cage no mass felt   No adenopathy  CV: HRRR, no clubbing cyanosis or  peripheral edema nl cap refill   MS: moves all extremities without noticeable focal  abnormality  PSYCH: pleasant and cooperative, no obvious depression or anxiety more animated tjam last visit   ASSESSMENT AND PLAN:  Discussed the following assessment and plan:  Adjustment reaction with anxiety and depression - disc  best use of medication daily and increase dose   Diabetes mellitus, type 2  Left-sided chest wall pain - vs breast no nodules  disc risk no alarm features  see gyne if worried   -Patient advised to return or notify health care team  if symptoms worsen or persist or new concerns arise.  Patient Instructions  The medication works better if take it every day.   Most people get a better effect from 300 mg per day but can take 150 mg per day  If you want to try this.  Contact for refills.   Done feel a lump but can get your gyne  to check out the sore area.   Keep appt  With labs in MAY.     Neta Mends. Crystal Townsend M.D.  Very cautious and hesitant to use more meds   Plan close fu regarding dm control  Total visit > 50% spent counseling and coordinating care

## 2012-08-10 ENCOUNTER — Other Ambulatory Visit (INDEPENDENT_AMBULATORY_CARE_PROVIDER_SITE_OTHER): Payer: 59

## 2012-08-10 DIAGNOSIS — R7309 Other abnormal glucose: Secondary | ICD-10-CM

## 2012-08-10 LAB — BASIC METABOLIC PANEL
Chloride: 102 mEq/L (ref 96–112)
Creatinine, Ser: 1.2 mg/dL (ref 0.4–1.2)
Potassium: 3.8 mEq/L (ref 3.5–5.1)
Sodium: 138 mEq/L (ref 135–145)

## 2012-08-10 LAB — HEMOGLOBIN A1C: Hgb A1c MFr Bld: 7.2 % — ABNORMAL HIGH (ref 4.6–6.5)

## 2012-08-20 ENCOUNTER — Ambulatory Visit (INDEPENDENT_AMBULATORY_CARE_PROVIDER_SITE_OTHER): Payer: 59 | Admitting: Internal Medicine

## 2012-08-20 ENCOUNTER — Encounter: Payer: Self-pay | Admitting: Internal Medicine

## 2012-08-20 VITALS — BP 134/90 | HR 84 | Temp 98.5°F | Wt 234.0 lb

## 2012-08-20 DIAGNOSIS — F4321 Adjustment disorder with depressed mood: Secondary | ICD-10-CM

## 2012-08-20 DIAGNOSIS — E119 Type 2 diabetes mellitus without complications: Secondary | ICD-10-CM

## 2012-08-20 DIAGNOSIS — J309 Allergic rhinitis, unspecified: Secondary | ICD-10-CM

## 2012-08-20 MED ORDER — BUPROPION HCL ER (XL) 300 MG PO TB24: 300.0000 mg | ORAL_TABLET | Freq: Every day | ORAL | Status: DC

## 2012-08-20 MED ORDER — FLUTICASONE PROPIONATE 50 MCG/ACT NA SUSP: NASAL | Status: DC

## 2012-08-20 NOTE — Patient Instructions (Addendum)
Continue on 300 mg  wellbutrin xl  Agree with seeing  counselor.   Continue weight loss.  Check blood sugars  Again.   a1c iun 3 months and fu visit.   Can add  Nasal steroid spray for  Prevention of allergy  Use    In allergy season.
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
## 2012-09-26 LAB — HM MAMMOGRAPHY

## 2012-10-01 ENCOUNTER — Encounter: Payer: Self-pay | Admitting: Internal Medicine

## 2012-11-07 ENCOUNTER — Other Ambulatory Visit (INDEPENDENT_AMBULATORY_CARE_PROVIDER_SITE_OTHER): Payer: 59

## 2012-11-07 DIAGNOSIS — IMO0001 Reserved for inherently not codable concepts without codable children: Secondary | ICD-10-CM

## 2012-11-07 LAB — HEMOGLOBIN A1C: Hgb A1c MFr Bld: 6.7 % — ABNORMAL HIGH (ref 4.6–6.5)

## 2012-11-20 ENCOUNTER — Ambulatory Visit (INDEPENDENT_AMBULATORY_CARE_PROVIDER_SITE_OTHER): Payer: 59 | Admitting: Internal Medicine

## 2012-11-20 ENCOUNTER — Encounter: Payer: Self-pay | Admitting: Internal Medicine

## 2012-11-20 VITALS — BP 128/86 | HR 77 | Temp 98.5°F | Wt 229.0 lb

## 2012-11-20 DIAGNOSIS — F4323 Adjustment disorder with mixed anxiety and depressed mood: Secondary | ICD-10-CM

## 2012-11-20 DIAGNOSIS — G479 Sleep disorder, unspecified: Secondary | ICD-10-CM

## 2012-11-20 DIAGNOSIS — I10 Essential (primary) hypertension: Secondary | ICD-10-CM

## 2012-11-20 DIAGNOSIS — E119 Type 2 diabetes mellitus without complications: Secondary | ICD-10-CM

## 2012-11-20 DIAGNOSIS — J309 Allergic rhinitis, unspecified: Secondary | ICD-10-CM

## 2012-11-20 MED ORDER — SAXAGLIPTIN HCL 5 MG PO TABS: 5.0000 mg | ORAL_TABLET | Freq: Every day | ORAL | Status: DC

## 2012-11-20 MED ORDER — TELMISARTAN-HCTZ 80-12.5 MG PO TABS: 1.0000 | ORAL_TABLET | Freq: Every day | ORAL | Status: DC

## 2012-11-20 MED ORDER — ATORVASTATIN CALCIUM 10 MG PO TABS: 10.0000 mg | ORAL_TABLET | Freq: Every day | ORAL | Status: DC

## 2012-11-20 MED ORDER — NAPHAZOLINE-PHENIRAMINE 0.027-0.315 % OP SOLN: OPHTHALMIC | Status: DC

## 2012-11-20 MED ORDER — CETIRIZINE HCL 10 MG PO TABS: 10.0000 mg | ORAL_TABLET | Freq: Every day | ORAL | Status: DC

## 2012-11-20 MED ORDER — ASPIRIN 81 MG PO TABS: 81.0000 mg | ORAL_TABLET | Freq: Every day | ORAL | Status: DC

## 2012-11-20 MED ORDER — ZOLPIDEM TARTRATE 5 MG PO TABS: ORAL_TABLET | ORAL | Status: DC

## 2012-11-20 NOTE — Progress Notes (Signed)
Chief Complaint  Patient presents with  . Follow-up    HPI: Fu  DM  Trying no new sx.  BP poss up  use of stress and little sleep  ? ambien  Up  Not sleeping for past 2-3 weeks and  Fear of rodents and had a mouse in sx and now on edge.  Hard to sleep.  Ask for sleep aid ambien in remote past.  wellbutrin has helped some in counseling . Son arrested 3 x this summer on rx for add may have bipolar. He is 16  Now  Goes to [employer] [facility] ROS: See pertinent positives and negatives per HPI.no cp sob  Itchy runny eyes  Zyrtec and eye drop help wants refills cause of flex spending coverage and also for asa.   Past Medical History  Diagnosis Date  . Fatigue   . Snoring     had a sleep study mild osa 2009  . OSA (obstructive sleep apnea)   . Other dysfunctions of sleep stages or arousal from sleep   . Chest pain   . Impaired fasting glucose   . Hypertension   . Knee pain, bilateral   . Allergic rhinitis   . GERD (gastroesophageal reflux disease)   . Anxiety   . Leukoplakia oral mucosa     2011  . IBS (irritable bowel syndrome)   . Menorrhagia   . Fibroids   . Dysmenorrhea   . Diabetes mellitus     Family History  Problem Relation Age of Onset  . Lung cancer Mother   . Cancer Mother     lung   . Prostate cancer Father   . Cancer Father     prostate  . Diabetes Father   . Hyperlipidemia Father   . Hypertension Father   . Asthma Father   . Cancer Maternal Aunt     breast  . Cancer Maternal Aunt     breast  . Cancer Maternal Aunt     breast    History   Social History  . Marital Status: Married    Spouse Name: N/A    Number of Children: N/A  . Years of Education: N/A   Social History Main Topics  . Smoking status: Never Smoker   . Smokeless tobacco: Never Used  . Alcohol Use: Yes     Comment: social  . Drug Use: No  . Sexual Activity: Not Currently    Birth Control/ Protection: IUD     Comment: mirena   Other Topics Concern  . 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 acetaminophen (TYLENOL) 325 MG tablet Take 2 tablets (650 mg total) by mouth every 8 (eight) hours as needed for pain (headache).  100 tablet  2  . aspirin 81 MG tablet Take 1 tablet (81 mg total) by mouth daily.  100 tablet  3  . atorvastatin (LIPITOR) 10 MG tablet Take 1 tablet (10 mg total) by mouth daily.  30 tablet  3  . Biotin 1000 MCG tablet Take 1,000 mcg by mouth 3 (three) times daily.      Marland Kitchen buPROPion (WELLBUTRIN XL) 300 MG 24 hr tablet Take 1 tablet (  300 mg total) by mouth daily.  30 tablet  3  . cetirizine (ZYRTEC) 10 MG tablet Take 1 tablet (10 mg total) by mouth daily.  30 tablet  11  . fluticasone (FLONASE) 50 MCG/ACT nasal spray 2 spray each nostril qd  16 g  3  . HYDROcodone-acetaminophen (NORCO) 7.5-325 MG per tablet Take 1 tablet by mouth every 6 (six) hours as needed. 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 levonorgestrel (MIRENA) 20 MCG/24HR IUD 1 each by Intrauterine route once.        . Multiple Vitamins-Minerals (HAIR/SKIN/NAILS PO) Take by mouth.        . Naphazoline-Pheniramine (OPCON-A) 0.027-0.315 % SOLN Use as directed to eye  1 Bottle  5  . Probiotic Product (PROBIOTIC FORMULA PO) Take by mouth.        . saxagliptin HCl (ONGLYZA) 5 MG TABS tablet Take 1 tablet (5 mg total) by mouth daily.  90 tablet  3  . telmisartan-hydrochlorothiazide (MICARDIS HCT) 80-12.5 MG per tablet Take 1 tablet by mouth daily.  90 tablet  3  . [DISCONTINUED] aspirin 81 MG tablet Take 1 tablet (81 mg total) by mouth daily.  100 tablet  3  . [DISCONTINUED] atorvastatin (LIPITOR) 10 MG tablet Take 1 tablet (10 mg total) by mouth daily.  30 tablet  3  . [DISCONTINUED] cetirizine (ZYRTEC) 10 MG  tablet Take 1 tablet (10 mg total) by mouth daily.  30 tablet  11  . [DISCONTINUED] Naphazoline-Pheniramine (OPCON-A) 0.027-0.315 % SOLN Use as directed to eye  1 Bottle  2  . [DISCONTINUED] Naphazoline-Pheniramine (OPCON-A) 0.027-0.315 % SOLN Use as directed to eye  1 Bottle  2  . [DISCONTINUED] saxagliptin HCl (ONGLYZA) 5 MG TABS tablet Take 1 tablet (5 mg total) by mouth daily.  90 tablet  3  . [DISCONTINUED] telmisartan-hydrochlorothiazide (MICARDIS HCT) 80-12.5 MG per tablet Take 1 tablet by mouth daily.  90 tablet  3  . [DISCONTINUED] zolpidem (AMBIEN) 10 MG tablet Take 1 tablet (10 mg total) by mouth at bedtime as needed.  30 tablet  1  . zolpidem (AMBIEN) 5 MG tablet Take 1 at night as needed for sleep limit to 3 x per week.  20 tablet  0   No facility-administered encounter medications on file as of 11/20/2012.    EXAM:  BP 128/86  Pulse 77  Temp(Src) 98.5 F (36.9 C) (Oral)  Wt 229 lb (103.874 kg)  BMI 37.51 kg/m2  SpO2 96%  Body mass index is 37.51 kg/(m^2).  GENERAL: vitals reviewed and listed above, alert, oriented, appears well hydrated and in no acute distress HEENT: atraumatic, conjunctiva  clear, no obvious abnormalities on inspection of external nose and ears  NECK: no obvious masses on inspection palpation CV: HRRR, no clubbing cyanosis or  peripheral edema nl cap refill  MS: moves all extremities without noticeable focal  abnormality PSYCH: pleasant and cooperative, no obvious depression or anxiety more relaxed look tired however Lab Results  Component Value Date   WBC 6.5 03/04/2010   HGB 12.3 03/04/2010   HCT 36.7 03/04/2010   PLT 377.0 03/04/2010   GLUCOSE 123* 08/10/2012   CHOL 175 04/12/2012   TRIG 109.0 04/12/2012   HDL 38.60* 04/12/2012   LDLDIRECT 147.2 01/11/2012   LDLCALC 115* 04/12/2012   ALT 16 04/12/2012   AST 19 04/12/2012   NA 138 08/10/2012   K 3.8 08/10/2012   CL 102 08/10/2012   CREATININE  1.2 08/10/2012   BUN 11 08/10/2012   CO2 26 08/10/2012   TSH 1.69  09/06/2011   HGBA1C 6.7* 11/07/2012   MICROALBUR 1.2 02/05/2009    ASSESSMENT AND PLAN:  Discussed the following assessment and plan:  Diabetes mellitus, type 2 - controlled  ocntinue  HYPERTENSION - better on repeat  Adjustment reaction with anxiety and depression - stable  coping  counseling adivsed still cont med  Allergic rhinitis - probable with eye sx   Sleep difficulties - situational recently phobic of rodents  dis risk benfit of med to try for now . disc with counselor if ongoing  Update eye exam foot exam  Pneumococcal at next visit  i think pat declined in past .  -Patient advised to return or notify health care team  if symptoms worsen or persist or new concerns arise.  Patient Instructions  Continue sleep hygiene. ambien  For 1-3 night in a row  And avoid reg use to  Avoid dependence with this medication. Lock up this medication.  Continue counseling. Same medications.  Don't overuse the eye drops .  Some allergy     Neta Mends. Annasophia Crocker M.D.

## 2012-11-20 NOTE — Patient Instructions (Signed)
Continue sleep hygiene. ambien  For 1-3 night in a row  And avoid reg use to  Avoid dependence with this medication. Lock up this medication.  Continue counseling. Same medications.  Don't overuse the eye drops .  Some allergy

## 2013-02-07 ENCOUNTER — Other Ambulatory Visit: Payer: Self-pay

## 2013-03-06 ENCOUNTER — Encounter: Payer: Self-pay | Admitting: Family Medicine

## 2013-03-06 ENCOUNTER — Encounter: Payer: Self-pay | Admitting: *Deleted

## 2013-03-06 ENCOUNTER — Ambulatory Visit (INDEPENDENT_AMBULATORY_CARE_PROVIDER_SITE_OTHER): Payer: 59 | Admitting: Family Medicine

## 2013-03-06 VITALS — BP 136/88 | Temp 99.6°F | Wt 233.0 lb

## 2013-03-06 DIAGNOSIS — I10 Essential (primary) hypertension: Secondary | ICD-10-CM

## 2013-03-06 DIAGNOSIS — J069 Acute upper respiratory infection, unspecified: Secondary | ICD-10-CM

## 2013-03-06 NOTE — Patient Instructions (Signed)
INSTRUCTIONS FOR UPPER RESPIRATORY INFECTION:  -plenty of rest and fluids  -nasal saline wash 2-3 times daily (use prepackaged nasal saline or bottled/distilled water if making your own)   -can use sinex nasal spray for drainage and nasal congestion - but do NOT use longer then 3-4 days  -can use tylenol or ibuprofen as directed for aches and sorethroat  -in the winter time, using a humidifier at night is helpful (please follow cleaning instructions)  -if you are taking a cough medication - use only as directed, may also try a teaspoon of honey to coat the throat and throat lozenges  -for sore throat, salt water gargles can help  -follow up if you have fevers, facial pain, tooth pain, difficulty breathing or are worsening or not getting better in 5-7 days  For the blood pressure: -Check blood pressure a few times per week after sitting for 5 minutes with feet flat on the floor -keep log and bring log and cuff to appointment with your doctor in 2 - 4 weeks -call your doctor if blood pressure running high or other concerns

## 2013-03-06 NOTE — Progress Notes (Signed)
Chief Complaint  Patient presents with  . Hypertension  . Otalgia    and headache    HPI: Crystal Townsend is a 47 yo F pt of Dr. Fabian Sharp with PMH of HTN here for an acute visit for:  Elevated blood pressure: -mention elevated at times at last visit with PCP -meds: telmisartan-hctz (80-12.5) -reports: BP running a little high at home last few days with older cuff she has at home - checked because had a cough and malaise, ear pain, mild sinus pressure/pain - BP 140s/90-100 -denies:CP, SOB, vision changes, swelling, palpitations, fevers, tooth pain, SOB, flu or ebola risks  ROS: See pertinent positives and negatives per HPI.  Past Medical History  Diagnosis Date  . Fatigue   . Snoring     had a sleep study mild osa 2009  . OSA (obstructive sleep apnea)   . Other dysfunctions of sleep stages or arousal from sleep   . Chest pain   . Impaired fasting glucose   . Hypertension   . Knee pain, bilateral   . Allergic rhinitis   . GERD (gastroesophageal reflux disease)   . Anxiety   . Leukoplakia oral mucosa     2011  . IBS (irritable bowel syndrome)   . Menorrhagia   . Fibroids   . Dysmenorrhea   . Diabetes mellitus     Past Surgical History  Procedure Laterality Date  . Cesarean section  1997  . Ectopic pregnancy surgery  2000  . Oral biopsy      Family History  Problem Relation Age of Onset  . Lung cancer Mother   . Cancer Mother     lung   . Prostate cancer Father   . Cancer Father     prostate  . Diabetes Father   . Hyperlipidemia Father   . Hypertension Father   . Asthma Father   . Cancer Maternal Aunt     breast  . Cancer Maternal Aunt     breast  . Cancer Maternal Aunt     breast    History   Social History  . Marital Status: Married    Spouse Name: N/A    Number of Children: N/A  . Years of Education: N/A   Social History Main Topics  . Smoking status: Never Smoker   . Smokeless tobacco: Never Used  . Alcohol Use: Yes     Comment: social  .  Drug Use: No  . Sexual Activity: Not Currently    Birth Control/ Protection: IUD     Comment: mirena   Other Topics Concern  . 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   Son 16 some legal and school difficulties    Current outpatient prescriptions:acetaminophen (TYLENOL) 325 MG tablet, Take 2 tablets (650 mg total) by mouth every 8 (eight) hours as needed for pain (headache)., Disp: 100 tablet, Rfl: 2;  aspirin 81 MG tablet, Take 1 tablet (81 mg total) by mouth daily., Disp: 100 tablet, Rfl: 3;  atorvastatin (LIPITOR) 10 MG tablet, Take 1 tablet (10 mg total) by mouth daily., Disp: 30 tablet, Rfl: 3 Biotin 1000 MCG tablet, Take 1,000 mcg by mouth 3 (three) times daily., Disp: , Rfl: ;  buPROPion (WELLBUTRIN XL) 300 MG 24 hr tablet, Take 1 tablet (300 mg total) by mouth daily.,  Disp: 30 tablet, Rfl: 3;  cetirizine (ZYRTEC) 10 MG tablet, Take 1 tablet (10 mg total) by mouth daily., Disp: 30 tablet, Rfl: 11;  fluticasone (FLONASE) 50 MCG/ACT nasal spray, 2 spray each nostril qd, Disp: 16 g, Rfl: 3 HYDROcodone-acetaminophen (NORCO) 7.5-325 MG per tablet, Take 1 tablet by mouth every 6 (six) hours as needed. 1 every 4-6 hours as needed for pain- [employer], Disp: , Rfl: ;  levonorgestrel (MIRENA) 20 MCG/24HR IUD, 1 each by Intrauterine route once.  , Disp: , Rfl: ;  Multiple Vitamins-Minerals (HAIR/SKIN/NAILS PO), Take by mouth.  , Disp: , Rfl:  Naphazoline-Pheniramine (OPCON-A) 0.027-0.315 % SOLN, Use as directed to eye, Disp: 1 Bottle, Rfl: 5;  Probiotic Product (PROBIOTIC FORMULA PO), Take by mouth.  , Disp: , Rfl: ;  saxagliptin HCl (ONGLYZA) 5 MG TABS tablet, Take 1 tablet (5 mg total) by mouth daily., Disp: 90 tablet, Rfl: 3;  telmisartan-hydrochlorothiazide (MICARDIS HCT) 80-12.5 MG per tablet, Take 1 tablet by mouth daily., Disp: 90 tablet, Rfl:  3 zolpidem (AMBIEN) 5 MG tablet, Take 1 at night as needed for sleep limit to 3 x per week., Disp: 20 tablet, Rfl: 0  EXAM:  Filed Vitals:   03/06/13 1400  BP: 136/88  Temp: 99.6 F (37.6 C)    Body mass index is 38.17 kg/(m^2).  GENERAL: vitals reviewed and listed above, alert, oriented, appears well hydrated and in no acute distress  HEENT: atraumatic, conjunttiva clear, no obvious abnormalities on inspection of external nose and ears, normal appearance of ear canals and TMs, clear nasal congestion, mild post oropharyngeal erythema with PND, no tonsillar edema or exudate, no sinus TTP  NECK: no obvious masses on inspection  LUNGS: clear to auscultation bilaterally, no wheezes, rales or rhonchi, good air movement  CV: HRRR, no peripheral edema  MS: moves all extremities without noticeable abnormality  PSYCH: pleasant and cooperative, no obvious depression or anxiety  ASSESSMENT AND PLAN:  Discussed the following assessment and plan:  HYPERTENSION  Upper respiratory infection  -BP ok after sitting, advised monitoring and follow up with PCP and bring cuff/og to that appt -other symptoms c/w VURI, discussed course, treatment, risks other possible etiologies and return precuations -Patient advised to return or notify a doctor immediately if symptoms worsen or persist or new concerns arise.  Patient Instructions  INSTRUCTIONS FOR UPPER RESPIRATORY INFECTION:  -plenty of rest and fluids  -nasal saline wash 2-3 times daily (use prepackaged nasal saline or bottled/distilled water if making your own)   -can use sinex nasal spray for drainage and nasal congestion - but do NOT use longer then 3-4 days  -can use tylenol or ibuprofen as directed for aches and sorethroat  -in the winter time, using a humidifier at night is helpful (please follow cleaning instructions)  -if you are taking a cough medication - use only as directed, may also try a teaspoon of honey to coat the  throat and throat lozenges  -for sore throat, salt water gargles can help  -follow up if you have fevers, facial pain, tooth pain, difficulty breathing or are worsening or not getting better in 5-7 days  For the blood pressure: -Check blood pressure a few times per week after sitting for 5 minutes with feet flat on the floor -keep log and bring log and cuff to appointment with your doctor in 2 - 4 weeks -call your doctor if blood pressure running high or other concerns      KIM, HANNAH R.

## 2013-03-06 NOTE — Progress Notes (Signed)
Pre visit review using our clinic review tool, if applicable. No additional management support is needed unless otherwise documented below in the visit note. 

## 2013-03-07 ENCOUNTER — Other Ambulatory Visit (INDEPENDENT_AMBULATORY_CARE_PROVIDER_SITE_OTHER): Payer: 59

## 2013-03-07 DIAGNOSIS — E785 Hyperlipidemia, unspecified: Secondary | ICD-10-CM

## 2013-03-07 DIAGNOSIS — IMO0001 Reserved for inherently not codable concepts without codable children: Secondary | ICD-10-CM

## 2013-03-07 LAB — LIPID PANEL
Cholesterol: 214 mg/dL — ABNORMAL HIGH (ref 0–200)
Total CHOL/HDL Ratio: 5
Triglycerides: 139 mg/dL (ref 0.0–149.0)
VLDL: 27.8 mg/dL (ref 0.0–40.0)

## 2013-03-07 LAB — HEMOGLOBIN A1C: Hgb A1c MFr Bld: 6.5 % (ref 4.6–6.5)

## 2013-03-26 ENCOUNTER — Ambulatory Visit: Payer: 59 | Admitting: Internal Medicine

## 2013-04-11 ENCOUNTER — Encounter: Payer: Self-pay | Admitting: Internal Medicine

## 2013-04-11 ENCOUNTER — Ambulatory Visit (INDEPENDENT_AMBULATORY_CARE_PROVIDER_SITE_OTHER): Payer: 59 | Admitting: Internal Medicine

## 2013-04-11 VITALS — BP 124/86 | Temp 98.5°F | Wt 234.0 lb

## 2013-04-11 DIAGNOSIS — E785 Hyperlipidemia, unspecified: Secondary | ICD-10-CM

## 2013-04-11 DIAGNOSIS — E119 Type 2 diabetes mellitus without complications: Secondary | ICD-10-CM

## 2013-04-11 DIAGNOSIS — I10 Essential (primary) hypertension: Secondary | ICD-10-CM

## 2013-04-11 DIAGNOSIS — Z23 Encounter for immunization: Secondary | ICD-10-CM

## 2013-04-11 DIAGNOSIS — F4323 Adjustment disorder with mixed anxiety and depressed mood: Secondary | ICD-10-CM

## 2013-04-11 MED ORDER — ATORVASTATIN CALCIUM 20 MG PO TABS: 20.0000 mg | ORAL_TABLET | Freq: Every day | ORAL | Status: DC

## 2013-04-11 NOTE — Progress Notes (Signed)
Chief Complaint  Patient presents with  . Follow-up  . Diabetes  . Hyperlipidemia    HPI: Patient comes in today for follow up of  multiple medical problems.  BP:   Forgot monitor ;  Reading at home may have not done correctly. Readings  Since then 129/90  DM bg low 88 and none over 123  In evening  Doing better starting to exercise still in school has one more semester No new symptoms ROS: See pertinent positives and negatives per HPI. ocass neck pain. Worse with truning . Her dyspnea on exertion exertional pain. Also gets tightness around right knee at times no radiating claudication.  Past Medical History  Diagnosis Date  . Fatigue   . Snoring     had a sleep study mild osa 2009  . OSA (obstructive sleep apnea)   . Other dysfunctions of sleep stages or arousal from sleep   . Chest pain   . Impaired fasting glucose   . Hypertension   . Knee pain, bilateral   . Allergic rhinitis   . GERD (gastroesophageal reflux disease)   . Anxiety   . Leukoplakia oral mucosa     2011  . IBS (irritable bowel syndrome)   . Menorrhagia   . Fibroids   . Dysmenorrhea   . Diabetes mellitus     Family History  Problem Relation Age of Onset  . Lung cancer Mother   . Cancer Mother     lung   . Prostate cancer Father   . Cancer Father     prostate  . Diabetes Father   . Hyperlipidemia Father   . Hypertension Father   . Asthma Father   . Cancer Maternal Aunt     breast  . Cancer Maternal Aunt     breast  . Cancer Maternal Aunt     breast    History   Social History  . Marital Status: Married    Spouse Name: N/A    Number of Children: N/A  . Years of Education: N/A   Social History Main Topics  . Smoking status: Never Smoker   . Smokeless tobacco: Never Used  . Alcohol Use: Yes     Comment: social  . Drug Use: No  . Sexual Activity: Not Currently    Birth Control/ Protection: IUD     Comment: mirena   Other Topics Concern  . 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 acetaminophen (TYLENOL) 325 MG tablet Take 2 tablets (650 mg total) by mouth every 8 (eight) hours as needed for pain (headache).  Marland Kitchen aspirin 81 MG tablet Take 1 tablet (81 mg total) by mouth daily.  Marland Kitchen atorvastatin (LIPITOR) 20 MG tablet Take 1 tablet (20 mg total) by mouth daily.  . Biotin 1000 MCG tablet Take 1,000 mcg by mouth 3 (three) times daily.  Marland Kitchen buPROPion (WELLBUTRIN XL) 300 MG 24 hr tablet Take 1 tablet (300 mg total) by mouth daily.  . cetirizine (ZYRTEC) 10 MG tablet Take 1 tablet (10 mg total) by mouth daily.  . fluticasone (FLONASE) 50 MCG/ACT nasal spray 2 spray each nostril qd  . HYDROcodone-acetaminophen (NORCO) 7.5-325 MG per tablet Take  1 tablet by mouth every 6 (six) hours as needed. 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 levonorgestrel (MIRENA) 20 MCG/24HR IUD 1 each by Intrauterine route once.    . Multiple Vitamins-Minerals (HAIR/SKIN/NAILS PO) Take by mouth.    . Naphazoline-Pheniramine (OPCON-A) 0.027-0.315 % SOLN Use as directed to eye  . Probiotic Product (PROBIOTIC FORMULA PO) Take by mouth.    . saxagliptin HCl (ONGLYZA) 5 MG TABS tablet Take 1 tablet (5 mg total) by mouth daily.  Marland Kitchen telmisartan-hydrochlorothiazide (MICARDIS HCT) 80-12.5 MG per tablet Take 1 tablet by mouth daily.  Marland Kitchen zolpidem (AMBIEN) 5 MG tablet Take 1 at night as needed for sleep limit to 3 x per week.  . [DISCONTINUED] atorvastatin (LIPITOR) 10 MG tablet Take 1 tablet (10 mg total) by mouth daily.    EXAM:  BP 124/86  Temp(Src) 98.5 F (36.9 C) (Oral)  Wt 234 lb (106.142 kg)  Body mass index is 38.33 kg/(m^2). 122/84 Wt Readings from Last 3 Encounters:  04/11/13 234 lb (106.142 kg)  03/06/13 233 lb (105.688  kg)  11/20/12 229 lb (103.874 kg)    GENERAL: vitals reviewed and listed above, alert, oriented, appears well hydrated and in no acute distress HNECK: no obvious masses on inspection palpation  LUNGS: clear to auscultation bilaterally, no wheezes, rales or rhonchi, good air movement CV: HRRR, no clubbing cyanosis or  peripheral edema nl cap refill  MS: moves all extremities without noticeable focal  abnormality PSYCH: pleasant and cooperative, no obvious depression or anxiety Lab Results  Component Value Date   WBC 6.5 03/04/2010   HGB 12.3 03/04/2010   HCT 36.7 03/04/2010   PLT 377.0 03/04/2010   GLUCOSE 123* 08/10/2012   CHOL 214* 03/07/2013   TRIG 139.0 03/07/2013   HDL 40.80 03/07/2013   LDLDIRECT 162.5 03/07/2013   LDLCALC 115* 04/12/2012   ALT 16 04/12/2012   AST 19 04/12/2012   NA 138 08/10/2012   K 3.8 08/10/2012   CL 102 08/10/2012   CREATININE 1.2 08/10/2012   BUN 11 08/10/2012   CO2 26 08/10/2012   TSH 1.69 09/06/2011   HGBA1C 6.5 03/07/2013   MICROALBUR 1.2 02/05/2009     ASSESSMENT AND PLAN:  Discussed the following assessment and plan: Reviewed laboratory studies condition HYPERTENSION - Systolic good diastolic in 69G monitor can bring machine Visit  Diabetes mellitus, type 2 - Better control A1c 6.5 Prevnar today  Other and unspecified hyperlipidemia - LDL still elevated options discussed increase to 20 mg Lipitor instead of 40 will recheck in 3-4 months  Need for vaccination with 13-polyvalent pneumococcal conjugate vaccine - Plan: Pneumococcal conjugate vaccine 13-valent  Adjustment reaction with anxiety and depression - Improved. Contact us for refill in the meantime if needed -Patient advised to return or notify health care team  if symptoms worsen or persist or new concerns arise.  Patient Instructions  Continue lifestyle intervention healthy eating and exercise . Sugars getting better .  Keep going!  Continue monitor  bp as you are doing.  Contact us if  concerns.  Increase lipitor to 20 mg per day . Pneumococcal vaccine today .Prevnar  Get the penumovax in a year.  rov in 3-4 months labs pre visit LIPID hg a1c LFT,TSH BMP     Standley Brooking. Darrold Bezek M.D.  Pre visit review using our clinic review tool, if applicable. No additional management support is needed unless otherwise documented below in the visit note.

## 2013-04-11 NOTE — Patient Instructions (Signed)
Continue lifestyle intervention healthy eating and exercise . Sugars getting better .  Keep going!  Continue monitor  bp as you are doing.  Contact us if concerns.  Increase lipitor to 20 mg per day . Pneumococcal vaccine today .Prevnar  Get the penumovax in a year.  rov in 3-4 months labs pre visit LIPID hg a1c LFT,TSH BMP

## 2013-04-12 ENCOUNTER — Telehealth: Payer: Self-pay | Admitting: Internal Medicine

## 2013-04-12 NOTE — Telephone Encounter (Signed)
Relevant patient education assigned to patient using Emmi. ° °

## 2013-05-15 ENCOUNTER — Other Ambulatory Visit: Payer: Self-pay | Admitting: Internal Medicine

## 2013-05-19 ENCOUNTER — Other Ambulatory Visit: Payer: Self-pay | Admitting: Internal Medicine

## 2013-06-17 ENCOUNTER — Telehealth: Payer: Self-pay | Admitting: Internal Medicine

## 2013-06-17 NOTE — Telephone Encounter (Signed)
[facility] requesting re-fill on atorvastatin (LIPITOR) 20 MG tablet

## 2013-06-18 MED ORDER — ATORVASTATIN CALCIUM 20 MG PO TABS: 20.0000 mg | ORAL_TABLET | Freq: Every day | ORAL | Status: DC

## 2013-06-18 NOTE — Telephone Encounter (Signed)
Sent to the pharmacy by e-scribe. 

## 2013-07-10 ENCOUNTER — Other Ambulatory Visit (INDEPENDENT_AMBULATORY_CARE_PROVIDER_SITE_OTHER): Payer: 59

## 2013-07-10 DIAGNOSIS — E1165 Type 2 diabetes mellitus with hyperglycemia: Principal | ICD-10-CM

## 2013-07-10 DIAGNOSIS — E039 Hypothyroidism, unspecified: Secondary | ICD-10-CM

## 2013-07-10 DIAGNOSIS — E785 Hyperlipidemia, unspecified: Secondary | ICD-10-CM

## 2013-07-10 DIAGNOSIS — IMO0001 Reserved for inherently not codable concepts without codable children: Secondary | ICD-10-CM

## 2013-07-10 DIAGNOSIS — I10 Essential (primary) hypertension: Secondary | ICD-10-CM

## 2013-07-10 LAB — HEPATIC FUNCTION PANEL
ALBUMIN: 4.1 g/dL (ref 3.5–5.2)
ALT: 15 U/L (ref 0–35)
AST: 19 U/L (ref 0–37)
Alkaline Phosphatase: 71 U/L (ref 39–117)
BILIRUBIN DIRECT: 0 mg/dL (ref 0.0–0.3)
TOTAL PROTEIN: 8.1 g/dL (ref 6.0–8.3)
Total Bilirubin: 0.5 mg/dL (ref 0.3–1.2)

## 2013-07-10 LAB — LIPID PANEL
Cholesterol: 202 mg/dL — ABNORMAL HIGH (ref 0–200)
HDL: 44.5 mg/dL (ref >39.00)
LDL Cholesterol: 137 mg/dL — ABNORMAL HIGH (ref 0–99)
TRIGLYCERIDES: 101 mg/dL (ref 0.0–149.0)
Total CHOL/HDL Ratio: 5
VLDL: 20.2 mg/dL (ref 0.0–40.0)

## 2013-07-10 LAB — HEMOGLOBIN A1C: Hgb A1c MFr Bld: 6.7 % — ABNORMAL HIGH (ref 4.6–6.5)

## 2013-07-10 LAB — BASIC METABOLIC PANEL
BUN: 10 mg/dL (ref 6–23)
CALCIUM: 9.6 mg/dL (ref 8.4–10.5)
CO2: 30 meq/L (ref 19–32)
Chloride: 99 mEq/L (ref 96–112)
Creatinine, Ser: 1.1 mg/dL (ref 0.4–1.2)
GFR: 69.86 mL/min (ref >60.00)
GLUCOSE: 117 mg/dL — AB (ref 70–99)
Potassium: 3.4 mEq/L — ABNORMAL LOW (ref 3.5–5.1)
SODIUM: 139 meq/L (ref 135–145)

## 2013-07-10 LAB — TSH: TSH: 1.15 u[IU]/mL (ref 0.35–5.50)

## 2013-07-18 ENCOUNTER — Encounter: Payer: Self-pay | Admitting: Internal Medicine

## 2013-07-18 ENCOUNTER — Ambulatory Visit (INDEPENDENT_AMBULATORY_CARE_PROVIDER_SITE_OTHER): Payer: 59 | Admitting: Internal Medicine

## 2013-07-18 VITALS — BP 124/74 | Temp 98.3°F | Ht 66.5 in | Wt 234.0 lb

## 2013-07-18 DIAGNOSIS — I10 Essential (primary) hypertension: Secondary | ICD-10-CM

## 2013-07-18 DIAGNOSIS — E785 Hyperlipidemia, unspecified: Secondary | ICD-10-CM

## 2013-07-18 DIAGNOSIS — E119 Type 2 diabetes mellitus without complications: Secondary | ICD-10-CM

## 2013-07-18 DIAGNOSIS — E669 Obesity, unspecified: Secondary | ICD-10-CM

## 2013-07-18 DIAGNOSIS — E876 Hypokalemia: Secondary | ICD-10-CM | POA: Insufficient documentation

## 2013-07-18 NOTE — Progress Notes (Signed)
Chief Complaint  Patient presents with  . Follow-up    HPI: Crystal Townsend  comes in today for follow up of  multiple medical problems.  dmnot eating as well. No low  Just started  taking lipitpor reg  This past week.  Hard to remember and doesn't take pills all together  No change in health otherwise  bp ok  ROS: See pertinent positives and negatives per HPI.  Past Medical History  Diagnosis Date  . Fatigue   . Snoring     had a sleep study mild osa 2009  . OSA (obstructive sleep apnea)   . Other dysfunctions of sleep stages or arousal from sleep   . Chest pain   . Impaired fasting glucose   . Hypertension   . Knee pain, bilateral   . Allergic rhinitis   . GERD (gastroesophageal reflux disease)   . Anxiety   . Leukoplakia oral mucosa     2011  . IBS (irritable bowel syndrome)   . Menorrhagia   . Fibroids   . Dysmenorrhea   . Diabetes mellitus     Family History  Problem Relation Age of Onset  . Lung cancer Mother   . Cancer Mother     lung   . Prostate cancer Father   . Cancer Father     prostate  . Diabetes Father   . Hyperlipidemia Father   . Hypertension Father   . Asthma Father   . Cancer Maternal Aunt     breast  . Cancer Maternal Aunt     breast  . Cancer Maternal Aunt     breast    History   Social History  . Marital Status: Married    Spouse Name: N/A    Number of Children: N/A  . Years of Education: N/A   Social History Main Topics  . Smoking status: Never Smoker   . Smokeless tobacco: Never Used  . Alcohol Use: Yes     Comment: social  . Drug Use: No  . Sexual Activity: Not Currently    Birth Control/ Protection: IUD     Comment: mirena   Other Topics Concern  . 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 acetaminophen (TYLENOL) 325 MG tablet Take 2 tablets (650 mg total) by mouth every 8 (eight) hours as needed for pain (headache).  Marland Kitchen aspirin 81 MG tablet Take 1 tablet (81 mg total) by mouth daily.  Marland Kitchen atorvastatin (LIPITOR) 20 MG tablet Take 1 tablet (20 mg total) by mouth daily.  . Biotin 1000 MCG tablet Take 1,000 mcg by mouth 3 (three) times daily.  Marland Kitchen buPROPion (WELLBUTRIN XL) 300 MG 24 hr tablet Take 1 tablet (300 mg total) by mouth daily.  . cetirizine (ZYRTEC) 10 MG tablet TAKE 1 TABLET BY MOUTH DAILY  . fluticasone (FLONASE) 50 MCG/ACT nasal spray 2 spray each nostril qd  . HYDROcodone-acetaminophen (NORCO) 7.5-325 MG per tablet Take 1 tablet by mouth every 6 (six) hours as needed. 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 levonorgestrel (MIRENA) 20 MCG/24HR IUD 1 each by Intrauterine route once.    . Multiple Vitamins-Minerals (  HAIR/SKIN/NAILS PO) Take by mouth.    . Naphazoline-Pheniramine (OPCON-A) 0.027-0.315 % SOLN Use as directed to eye  . Probiotic Product (PROBIOTIC FORMULA PO) Take by mouth.    . saxagliptin HCl (ONGLYZA) 5 MG TABS tablet Take 1 tablet (5 mg total) by mouth daily.  Marland Kitchen telmisartan-hydrochlorothiazide (MICARDIS HCT) 80-12.5 MG per tablet Take 1 tablet by mouth daily.  Marland Kitchen zolpidem (AMBIEN) 5 MG tablet Take 1 at night as needed for sleep limit to 3 x per week.  . diazepam (VALIUM) 10 MG tablet     EXAM:  BP 124/74  Temp(Src) 98.3 F (36.8 C) (Oral)  Ht 5' 6.5" (1.689 m)  Wt 234 lb (106.142 kg)  BMI 37.21 kg/m2  Body mass index is 37.21 kg/(m^2). Wt Readings from Last 3 Encounters:  07/18/13 234 lb (106.142 kg)  04/11/13 234 lb (106.142 kg)  03/06/13 233 lb (105.688 kg)    GENERAL: vitals reviewed and listed above, alert, oriented, appears well hydrated and in no acute distress HEENT: atraumatic, conjunctiva  clear, no obvious abnormalities on inspection of external nose and ears NECK: no obvious masses  on inspection palpation  CV: HRRR, no clubbing cyanosis or  peripheral edema nl cap refill  MS: moves all extremities without noticeable focal  abnormality PSYCH: pleasant and cooperative, no obvious depression or anxiety  Today  Lab Results  Component Value Date   WBC 6.5 03/04/2010   HGB 12.3 03/04/2010   HCT 36.7 03/04/2010   PLT 377.0 03/04/2010   GLUCOSE 117* 07/10/2013   CHOL 202* 07/10/2013   TRIG 101.0 07/10/2013   HDL 44.50 07/10/2013   LDLDIRECT 162.5 03/07/2013   LDLCALC 137* 07/10/2013   ALT 15 07/10/2013   AST 19 07/10/2013   NA 139 07/10/2013   K 3.4* 07/10/2013   CL 99 07/10/2013   CREATININE 1.1 07/10/2013   BUN 10 07/10/2013   CO2 30 07/10/2013   TSH 1.15 07/10/2013   HGBA1C 6.7* 07/10/2013   MICROALBUR 1.2 02/05/2009    ASSESSMENT AND PLAN:  Discussed the following assessment and plan:  Diabetes mellitus, type 2 - stable  Other and unspecified hyperlipidemia - disc strategies to improve comolicance  of the lipitor 20   Unspecified essential hypertension - good control   Hypopotassemia - borderline low prefer inc from diet  counseled bp good control  Obesity, unspecified  -Patient advised to return or notify health care team  if symptoms worsen ,persist or new concerns arise. Total visit 63mins > 50% spent counseling and coordinating care     Patient Instructions  Try taking the lipitor with evening meal or such to remember to do this.   To get a better effect on your cholesterol level.   Potassium  Borderline low: increase potassium in diet .    Potassium Content of Foods Potassium is a mineral found in many foods and drinks. It helps keep fluids and minerals balanced in your body and also affects how steadily your heart beats. The body needs potassium to control blood pressure and to keep the muscles and nervous system healthy. However, certain health conditions and medicine may require you to eat more or less potassium-rich foods and drinks. Your caregiver or dietitian will  tell you how much potassium you should have each day. COMMON SERVING SIZES The list below tells you how big or small common portion sizes are:  1 oz.........4 stacked dice.  3 oz........Marland KitchenDeck of cards.  1 tsp.......Marland KitchenTip of little finger.  1 tbsp....Marland KitchenMarland KitchenThumb.  2 tbsp....Marland KitchenMarland KitchenGolf  ball.   c..........Marland KitchenHalf of a fist.  1 c...........Marland KitchenA fist. FOODS AND DRINKS HIGH IN POTASSIUM More than 200 mg of potassium per serving. A serving size is  c (120 mL or noted gram weight) unless otherwise stated. While all the items on this list are high in potassium, some items are higher in potassium than others. Fruits  Apricots (sliced), 83 g.  Apricots (dried halves), 3 oz / 24 g.  Avocado (cubed),  c / 50 g.  Banana (sliced), 75 g.  Cantaloupe (cubed), 80 g.  Dates (pitted), 5 whole / 35 g.  Figs (dried), 4 whole / 32 g.  Guava, c / 55 g.  Honeydew, 1 wedge / 85 g.  Kiwi (sliced), 90 g.  Nectarine, 1 small / 129 g.  Orange, 1 medium / 131 g.  Orange juice.  Pomegranate seeds, 87 g.  Pomegranate juice.  Prunes (pitted), 3 whole / 30 g.  Prune juice, 3 oz / 90 mL.  Seedless raisins, 3 tbsp / 27 g. Vegetables  Artichoke,  of a medium / 64 g.  Asparagus (boiled), 90 g.  Baked beans,  c / 63 g.  Bamboo shoots,  c / 38 g.  Beets (cooked slices), 85 g.  Broccoli (boiled), 78 g.  Brussels sprout (boiled), 78 g.  Butternut squash (baked), 103 g.  Chickpea (cooked), 82 g.  Green peas (cooked), 80 g.  Hubbard squash (baked cubes),  c / 68 g.  Kidney beans (cooked), 5 tbsp / 55 g.  Lima beans (cooked),  c / 43 g.  Navy beans (cooked),  c / 61 g.  Potato (baked), 61 g.  Potato (boiled), 78 g.  Pumpkin (boiled), 123 g.  Refried beans,  c / 79 g.  Spinach (cooked),  c / 45 g.  Split peas (cooked),  c / 65 g.  Sun-dried tomatoes, 2 tbsp / 7 g.  Sweet potato (baked),  c / 50 g.  Tomato (chopped or sliced), 90 g.  Tomato juice.  Tomato paste, 4  tsp / 21 g.  Tomato sauce,  c / 61 g.  Vegetable juice.  White mushrooms (cooked), 78 g.  Yam (cooked or baked),  c / 34 g.  Zucchini squash (boiled), 90 g. Other Foods and Drinks  Almonds (whole),  c / 36 g.  Cashews (oil roasted),  c / 32 g.  Chocolate milk.  Chocolate pudding, 142 g.  Clams (steamed), 1.5 oz / 43 g.  Dark chocolate, 1.5 oz / 42 g.  Fish, 3 oz / 85 g.  King crab (steamed), 3 oz / 85 g.  Lobster (steamed), 4 oz / 113 g.  Milk (skim, 1%, 2%, whole), 1 c / 240 mL.  Milk chocolate, 2.3 oz / 66 g.  Milk shake.  Nonfat fruit variety yogurt, 123 g.  Peanuts (oil roasted), 1 oz / 28 g.  Peanut butter, 2 tbsp / 32 g.  Pistachio nuts, 1 oz / 28 g.  Pumpkin seeds, 1 oz / 28 g.  Red meat (broiled, cooked, grilled), 3 oz / 85 g.  Scallops (steamed), 3 oz / 85 g.  Shredded wheat cereal (dry), 3 oblong biscuits / 75 g.  Spaghetti sauce,  c / 66 g.  Sunflower seeds (dry roasted), 1 oz / 28 g.  Veggie burger, 1 patty / 70 g. FOODS MODERATE IN POTASSIUM Between 150 mg and 200 mg per serving. A serving is  c (120 mL or noted gram weight) unless otherwise stated. Fruits  Grapefruit,  of the fruit / 123 g.  Grapefruit juice.  Pineapple juice.  Plums (sliced), 83 g.  Tangerine, 1 large / 120 g. Vegetables  Carrots (boiled), 78 g.  Carrots (sliced), 61 g.  Rhubarb (cooked with sugar), 120 g.  Rutabaga (cooked), 120 g.  Sweet corn (cooked), 75 g.  Yellow snap beans (cooked), 63 g. Other Foods and Drinks   Bagel, 1 bagel / 98 g.  Chicken breast (roasted and chopped),  c / 70 g.  Chocolate ice cream / 66 g.  Pita bread, 1 large / 64 g.  Shrimp (steamed), 4 oz / 113 g.  Swiss cheese (diced), 70 g.  Vanilla ice cream, 66 g.  Vanilla pudding, 140 g. FOODS LOW IN POTASSIUM Less than 150 mg per serving. A serving size is  cup (120 mL or noted gram weight) unless otherwise stated. If you eat more than 1 serving of a  food low in potassium, the food may be considered a food high in potassium. Fruits  Apple (slices), 55 g.  Apple juice.  Applesauce, 122 g.  Blackberries, 72 g.  Blueberries, 74 g.  Cranberries, 50 g.  Cranberry juice.  Fruit cocktail, 119 g.  Fruit punch.  Grapes, 46 g.  Grape juice.  Mandarin oranges (canned), 126 g.  Peach (slices), 77 g.  Pineapple (chunks), 83 g.  Raspberries, 62 g.  Red cherries (without pits), 78 g.  Strawberries (sliced), 83 g.  Watermelon (diced), 76 g. Vegetables  Alfalfa sprouts, 17 g.  Bell peppers (sliced), 46 g.  Cabbage (shredded), 35 g.  Cauliflower (boiled), 62 g.  Celery, 51 g.  Collard greens (boiled), 95 g.  Cucumber (sliced), 52 g.  Eggplant (cubed), 41 g.  Green beans (boiled), 63 g.  Lettuce (shredded), 1 c / 36 g.  Onions (sauteed), 44 g.  Radishes (sliced), 58 g.  Spaghetti squash, 51 g. Other Foods and Drinks  [employer], 1 slice / 28 g.  Black tea.  Brown rice (cooked), 98 g.  Butter croissant, 1 medium / 57 g.  Carbonated soda.  Coffee.  Cheddar cheese (diced), 66 g.  Corn flake cereal (dry), 14 g.  Cottage cheese, 118 g.  Cream of rice cereal (cooked), 122 g.  Cream of wheat cereal (cooked), 126 g.  Crisped rice cereal (dry), 14 g.  Egg (boiled, fried, poached, omelet, scrambled), 1 large / 46 61 g.  English muffin, 1 muffin / 57 g.  Frozen ice pop, 1 pop / 55 g.  Graham cracker, 1 large rectangular cracker / 14 g.  Jelly beans, 112 g.  Non-dairy whipped topping.  Oatmeal, 88 g.  Orange sherbet, 74 g.  Puffed rice cereal (dry), 7 g.  Pasta (cooked), 70 g.  Rice cakes, 4 cakes / 36 g.  Sugared doughnut, 4 oz / 116 g.  White bread, 1 slice / 30 g.  White rice (cooked), 79 93 g.  Wild rice (cooked), 82 g.  Yellow cake, 1 slice / 68 g. Document Released: 11/02/2004 Document Revised: 03/07/2012 Document Reviewed: 08/05/2011 [employer] Patient  Information 2014 [place].      Standley Brooking. Panosh M.D.  Pre visit review using our clinic review tool, if applicable. No additional management support is needed unless otherwise documented below in the visit note.

## 2013-07-18 NOTE — Patient Instructions (Signed)
Try taking the lipitor with evening meal or such to remember to do this.   To get a better effect on your cholesterol level.   Potassium  Borderline low: increase potassium in diet .    Potassium Content of Foods Potassium is a mineral found in many foods and drinks. It helps keep fluids and minerals balanced in your body and also affects how steadily your heart beats. The body needs potassium to control blood pressure and to keep the muscles and nervous system healthy. However, certain health conditions and medicine may require you to eat more or less potassium-rich foods and drinks. Your caregiver or dietitian will tell you how much potassium you should have each day. COMMON SERVING SIZES The list below tells you how big or small common portion sizes are:  1 oz.........4 stacked dice.  3 oz........Marland KitchenDeck of cards.  1 tsp.......Marland KitchenTip of little finger.  1 tbsp....Marland KitchenMarland KitchenThumb.  2 tbsp....Marland KitchenMarland KitchenGolf ball.   c..........Marland KitchenHalf of a fist.  1 c...........Marland KitchenA fist. FOODS AND DRINKS HIGH IN POTASSIUM More than 200 mg of potassium per serving. A serving size is  c (120 mL or noted gram weight) unless otherwise stated. While all the items on this list are high in potassium, some items are higher in potassium than others. Fruits  Apricots (sliced), 83 g.  Apricots (dried halves), 3 oz / 24 g.  Avocado (cubed),  c / 50 g.  Banana (sliced), 75 g.  Cantaloupe (cubed), 80 g.  Dates (pitted), 5 whole / 35 g.  Figs (dried), 4 whole / 32 g.  Guava, c / 55 g.  Honeydew, 1 wedge / 85 g.  Kiwi (sliced), 90 g.  Nectarine, 1 small / 129 g.  Orange, 1 medium / 131 g.  Orange juice.  Pomegranate seeds, 87 g.  Pomegranate juice.  Prunes (pitted), 3 whole / 30 g.  Prune juice, 3 oz / 90 mL.  Seedless raisins, 3 tbsp / 27 g. Vegetables  Artichoke,  of a medium / 64 g.  Asparagus (boiled), 90 g.  Baked beans,  c / 63 g.  Bamboo shoots,  c / 38 g.  Beets (cooked slices), 85  g.  Broccoli (boiled), 78 g.  Brussels sprout (boiled), 78 g.  Butternut squash (baked), 103 g.  Chickpea (cooked), 82 g.  Green peas (cooked), 80 g.  Hubbard squash (baked cubes),  c / 68 g.  Kidney beans (cooked), 5 tbsp / 55 g.  Lima beans (cooked),  c / 43 g.  Navy beans (cooked),  c / 61 g.  Potato (baked), 61 g.  Potato (boiled), 78 g.  Pumpkin (boiled), 123 g.  Refried beans,  c / 79 g.  Spinach (cooked),  c / 45 g.  Split peas (cooked),  c / 65 g.  Sun-dried tomatoes, 2 tbsp / 7 g.  Sweet potato (baked),  c / 50 g.  Tomato (chopped or sliced), 90 g.  Tomato juice.  Tomato paste, 4 tsp / 21 g.  Tomato sauce,  c / 61 g.  Vegetable juice.  White mushrooms (cooked), 78 g.  Yam (cooked or baked),  c / 34 g.  Zucchini squash (boiled), 90 g. Other Foods and Drinks  Almonds (whole),  c / 36 g.  Cashews (oil roasted),  c / 32 g.  Chocolate milk.  Chocolate pudding, 142 g.  Clams (steamed), 1.5 oz / 43 g.  Dark chocolate, 1.5 oz / 42 g.  Fish, 3 oz / 85 g.  King crab (  steamed), 3 oz / 85 g.  Lobster (steamed), 4 oz / 113 g.  Milk (skim, 1%, 2%, whole), 1 c / 240 mL.  Milk chocolate, 2.3 oz / 66 g.  Milk shake.  Nonfat fruit variety yogurt, 123 g.  Peanuts (oil roasted), 1 oz / 28 g.  Peanut butter, 2 tbsp / 32 g.  Pistachio nuts, 1 oz / 28 g.  Pumpkin seeds, 1 oz / 28 g.  Red meat (broiled, cooked, grilled), 3 oz / 85 g.  Scallops (steamed), 3 oz / 85 g.  Shredded wheat cereal (dry), 3 oblong biscuits / 75 g.  Spaghetti sauce,  c / 66 g.  Sunflower seeds (dry roasted), 1 oz / 28 g.  Veggie burger, 1 patty / 70 g. FOODS MODERATE IN POTASSIUM Between 150 mg and 200 mg per serving. A serving is  c (120 mL or noted gram weight) unless otherwise stated. Fruits  Grapefruit,  of the fruit / 123 g.  Grapefruit juice.  Pineapple juice.  Plums (sliced), 83 g.  Tangerine, 1 large / 120 g. Vegetables  Carrots  (boiled), 78 g.  Carrots (sliced), 61 g.  Rhubarb (cooked with sugar), 120 g.  Rutabaga (cooked), 120 g.  Sweet corn (cooked), 75 g.  Yellow snap beans (cooked), 63 g. Other Foods and Drinks   Bagel, 1 bagel / 98 g.  Chicken breast (roasted and chopped),  c / 70 g.  Chocolate ice cream / 66 g.  Pita bread, 1 large / 64 g.  Shrimp (steamed), 4 oz / 113 g.  Swiss cheese (diced), 70 g.  Vanilla ice cream, 66 g.  Vanilla pudding, 140 g. FOODS LOW IN POTASSIUM Less than 150 mg per serving. A serving size is  cup (120 mL or noted gram weight) unless otherwise stated. If you eat more than 1 serving of a food low in potassium, the food may be considered a food high in potassium. Fruits  Apple (slices), 55 g.  Apple juice.  Applesauce, 122 g.  Blackberries, 72 g.  Blueberries, 74 g.  Cranberries, 50 g.  Cranberry juice.  Fruit cocktail, 119 g.  Fruit punch.  Grapes, 46 g.  Grape juice.  Mandarin oranges (canned), 126 g.  Peach (slices), 77 g.  Pineapple (chunks), 83 g.  Raspberries, 62 g.  Red cherries (without pits), 78 g.  Strawberries (sliced), 83 g.  Watermelon (diced), 76 g. Vegetables  Alfalfa sprouts, 17 g.  Bell peppers (sliced), 46 g.  Cabbage (shredded), 35 g.  Cauliflower (boiled), 62 g.  Celery, 51 g.  Collard greens (boiled), 95 g.  Cucumber (sliced), 52 g.  Eggplant (cubed), 41 g.  Green beans (boiled), 63 g.  Lettuce (shredded), 1 c / 36 g.  Onions (sauteed), 44 g.  Radishes (sliced), 58 g.  Spaghetti squash, 51 g. Other Foods and Drinks  [employer], 1 slice / 28 g.  Black tea.  Brown rice (cooked), 98 g.  Butter croissant, 1 medium / 57 g.  Carbonated soda.  Coffee.  Cheddar cheese (diced), 66 g.  Corn flake cereal (dry), 14 g.  Cottage cheese, 118 g.  Cream of rice cereal (cooked), 122 g.  Cream of wheat cereal (cooked), 126 g.  Crisped rice cereal (dry), 14 g.  Egg (boiled, fried,  poached, omelet, scrambled), 1 large / 46 61 g.  English muffin, 1 muffin / 57 g.  Frozen ice pop, 1 pop / 55 g.  Graham cracker, 1 large rectangular cracker /  14 g.  Jelly beans, 112 g.  Non-dairy whipped topping.  Oatmeal, 88 g.  Orange sherbet, 74 g.  Puffed rice cereal (dry), 7 g.  Pasta (cooked), 70 g.  Rice cakes, 4 cakes / 36 g.  Sugared doughnut, 4 oz / 116 g.  White bread, 1 slice / 30 g.  White rice (cooked), 79 93 g.  Wild rice (cooked), 82 g.  Yellow cake, 1 slice / 68 g. Document Released: 11/02/2004 Document Revised: 03/07/2012 Document Reviewed: 08/05/2011 [facility] Patient Information 2014 [place].

## 2013-07-31 ENCOUNTER — Telehealth: Payer: Self-pay

## 2013-07-31 NOTE — Telephone Encounter (Signed)
Relevant patient education assigned to patient using Emmi. ° °

## 2013-08-02 ENCOUNTER — Other Ambulatory Visit: Payer: Self-pay

## 2013-08-02 DIAGNOSIS — Z1231 Encounter for screening mammogram for malignant neoplasm of breast: Secondary | ICD-10-CM

## 2013-08-07 ENCOUNTER — Telehealth: Payer: Self-pay | Admitting: Internal Medicine

## 2013-08-07 NOTE — Telephone Encounter (Signed)
Pt needs refills  on bupropion 035 mg #90,certirizine 10 mg #59 and opcon-A eye drops. Pt has appt sch for aug 2015.  [facility]

## 2013-08-08 MED ORDER — BUPROPION HCL ER (XL) 300 MG PO TB24: 300.0000 mg | ORAL_TABLET | Freq: Every day | ORAL | Status: DC

## 2013-08-08 MED ORDER — CETIRIZINE HCL 10 MG PO TABS: ORAL_TABLET | ORAL | Status: DC

## 2013-08-08 MED ORDER — NAPHAZOLINE-PHENIRAMINE 0.027-0.315 % OP SOLN: OPHTHALMIC | Status: DC

## 2013-08-08 NOTE — Telephone Encounter (Signed)
Sent by e-scribe. 

## 2013-09-02 ENCOUNTER — Ambulatory Visit
Admission: RE | Admit: 2013-09-02 | Discharge: 2013-09-02 | Disposition: A | Discharge status: Home / Self Care | Payer: 59 | Source: Ambulatory Visit

## 2013-09-02 DIAGNOSIS — Z1231 Encounter for screening mammogram for malignant neoplasm of breast: Secondary | ICD-10-CM

## 2013-09-03 ENCOUNTER — Other Ambulatory Visit: Payer: Self-pay | Admitting: Obstetrics and Gynecology

## 2013-09-03 DIAGNOSIS — R928 Other abnormal and inconclusive findings on diagnostic imaging of breast: Secondary | ICD-10-CM

## 2013-09-09 ENCOUNTER — Ambulatory Visit
Admission: RE | Admit: 2013-09-09 | Discharge: 2013-09-09 | Disposition: A | Discharge status: Home / Self Care | Payer: 59 | Source: Ambulatory Visit | Attending: Obstetrics and Gynecology | Admitting: Obstetrics and Gynecology

## 2013-09-09 DIAGNOSIS — R928 Other abnormal and inconclusive findings on diagnostic imaging of breast: Secondary | ICD-10-CM

## 2013-11-13 ENCOUNTER — Other Ambulatory Visit (INDEPENDENT_AMBULATORY_CARE_PROVIDER_SITE_OTHER): Payer: 59

## 2013-11-13 DIAGNOSIS — E785 Hyperlipidemia, unspecified: Secondary | ICD-10-CM

## 2013-11-13 DIAGNOSIS — IMO0001 Reserved for inherently not codable concepts without codable children: Secondary | ICD-10-CM

## 2013-11-13 DIAGNOSIS — E1165 Type 2 diabetes mellitus with hyperglycemia: Secondary | ICD-10-CM

## 2013-11-13 LAB — LIPID PANEL
CHOLESTEROL: 194 mg/dL (ref 0–200)
HDL: 40.2 mg/dL (ref >39.00)
LDL CALC: 136 mg/dL — AB (ref 0–99)
NonHDL: 153.8
TRIGLYCERIDES: 89 mg/dL (ref 0.0–149.0)
Total CHOL/HDL Ratio: 5
VLDL: 17.8 mg/dL (ref 0.0–40.0)

## 2013-11-13 LAB — BASIC METABOLIC PANEL
BUN: 9 mg/dL (ref 6–23)
CO2: 29 mEq/L (ref 19–32)
Calcium: 9.3 mg/dL (ref 8.4–10.5)
Chloride: 103 mEq/L (ref 96–112)
Creatinine, Ser: 1.1 mg/dL (ref 0.4–1.2)
GFR: 64.84 mL/min (ref >60.00)
GLUCOSE: 128 mg/dL — AB (ref 70–99)
Potassium: 3.9 mEq/L (ref 3.5–5.1)
Sodium: 139 mEq/L (ref 135–145)

## 2013-11-13 LAB — HEMOGLOBIN A1C: Hgb A1c MFr Bld: 6.9 % — ABNORMAL HIGH (ref 4.6–6.5)

## 2013-11-21 ENCOUNTER — Telehealth: Payer: Self-pay | Admitting: Internal Medicine

## 2013-11-21 NOTE — Telephone Encounter (Signed)
Patient Information:  Caller Name: Akeelah  Phone: [phone]  Patient: Crystal Townsend  Gender: Female  DOB: 05-Aug-1965  Age: 48 Years  PCP: Shanon Ace (Family Practice)  Pregnant: No  Office Follow Up:  Does the office need to follow up with this patient?: No  Instructions For The Office: N/A   Symptoms  Reason For Call & Symptoms: Pt reports she hit right side near hairline, "goose egg" developed and brusing on a metal shelve  in restroom.  Pt reports headache since head injury but only at the site.  Reviewed Health History In EMR: Yes  Reviewed Medications In EMR: Yes  Reviewed Allergies In EMR: Yes  Reviewed Surgeries / Procedures: Yes  Date of Onset of Symptoms: 11/19/2013 OB / GYN:  LMP: 10/21/2013  Guideline(s) Used:  Head Injury  Disposition Per Guideline:   Home Care  Reason For Disposition Reached:   Minor head injury  Advice Given:  Reassurance - Direct Blow (Contusion, Bruise)  This sounds like a scalp injury rather than a brain injury or concussion. Treatment at home should be safe. A direct blow to your scalp can cause a contusion. Contusion is the medical term for bruise.  Symptoms are mild pain, swelling, and/or bruising. Sometimes there can also be mild dizziness and nausea.  Here is some care advice that should help.  Apply a Cold Pack:  Apply a cold pack or an ice bag (wrapped in a moist towel) to the area for 20 minutes. Repeat in 1 hour, then every 4 hours while awake.  Continue this for the first 48 hours after an injury.  This will help decrease pain and swelling.  Apply Heat to the Area:  Beginning 48 hours after an injury, apply a warm washcloth or heating pad for 10 minutes three times a day.  This will help increase blood flow and improve healing.  Expected Course:  Most head trauma only causes an injury to the scalp. Pain, swelling, and bruising usually start to get better 2 to 3 days after an injury.  Swelling most often is gone after 1  week.  Bruises fade away slowly over 1-2 weeks.  It may take 2 weeks for pain and tenderness of the injured area to go away.  Diet:   Clear fluids to drink at first, in case of vomiting. May resume a regular diet after 2 hours.  Call Back If:  Severe headache  Extremity weakness or numbness occurs  Slurred speech or blurred vision occurs  Vomiting occurs  You become worse.  Patient Will Follow Care Advice:  YES

## 2013-11-21 NOTE — Telephone Encounter (Signed)
Noted. FYI 

## 2013-11-22 NOTE — Telephone Encounter (Signed)
Noted  

## 2013-11-25 ENCOUNTER — Ambulatory Visit (INDEPENDENT_AMBULATORY_CARE_PROVIDER_SITE_OTHER): Payer: 59 | Admitting: Internal Medicine

## 2013-11-25 ENCOUNTER — Encounter: Payer: Self-pay | Admitting: Internal Medicine

## 2013-11-25 VITALS — BP 130/88 | Temp 98.1°F | Ht 66.5 in | Wt 239.0 lb

## 2013-11-25 DIAGNOSIS — I10 Essential (primary) hypertension: Secondary | ICD-10-CM

## 2013-11-25 DIAGNOSIS — E669 Obesity, unspecified: Secondary | ICD-10-CM

## 2013-11-25 DIAGNOSIS — Z23 Encounter for immunization: Secondary | ICD-10-CM

## 2013-11-25 DIAGNOSIS — E785 Hyperlipidemia, unspecified: Secondary | ICD-10-CM

## 2013-11-25 DIAGNOSIS — E119 Type 2 diabetes mellitus without complications: Secondary | ICD-10-CM

## 2013-11-25 MED ORDER — BUPROPION HCL ER (XL) 300 MG PO TB24: 300.0000 mg | ORAL_TABLET | Freq: Every day | ORAL | Status: DC

## 2013-11-25 MED ORDER — PROBIOTIC FORMULA PO CAPS: 1.0000 | ORAL_CAPSULE | Freq: Every day | ORAL | Status: DC

## 2013-11-25 MED ORDER — CETIRIZINE HCL 10 MG PO TABS: ORAL_TABLET | ORAL | Status: DC

## 2013-11-25 NOTE — Progress Notes (Signed)
Pre visit review using our clinic review tool, if applicable. No additional management support is needed unless otherwise documented below in the visit note.  Chief Complaint  Patient presents with  . Follow-up    HPI: Crystal Townsend  comes in today for follow up of  multiple medical problems.  Some distraction and stress  Father dx with stage 4 cancer  Prostate .   May have missed some medication:  About  20 days.  Son graduated and doing better .  Otherwise bp was good   ROS: See pertinent positives and negatives per HPI. No cp sob  Mood better   Past Medical History  Diagnosis Date  . Fatigue   . Snoring     had a sleep study mild osa 2009  . OSA (obstructive sleep apnea)   . Other dysfunctions of sleep stages or arousal from sleep   . Chest pain   . Impaired fasting glucose   . Hypertension   . Knee pain, bilateral   . Allergic rhinitis   . GERD (gastroesophageal reflux disease)   . Anxiety   . Leukoplakia oral mucosa     2011  . IBS (irritable bowel syndrome)   . Menorrhagia   . Fibroids   . Dysmenorrhea   . Diabetes mellitus     Family History  Problem Relation Age of Onset  . Lung cancer Mother   . Cancer Mother     lung   . Prostate cancer Father   . Cancer Father     prostate  . Diabetes Father   . Hyperlipidemia Father   . Hypertension Father   . Asthma Father   . Cancer Maternal Aunt     breast  . Cancer Maternal Aunt     breast  . Cancer Maternal Aunt     breast    History   Social History  . Marital Status: Married    Spouse Name: N/A    Number of Children: N/A  . Years of Education: N/A   Social History Main Topics  . Smoking status: Never Smoker   . Smokeless tobacco: Never Used  . Alcohol Use: Yes     Comment: social  . Drug Use: No  . Sexual Activity: Not Currently    Birth Control/ Protection: IUD     Comment: mirena   Other Topics Concern  . 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 acetaminophen (TYLENOL) 325 MG tablet Take 2 tablets (650 mg total) by mouth every 8 (eight) hours as needed for pain (headache).  Marland Kitchen aspirin 81 MG tablet Take 1 tablet (81 mg total) by mouth daily.  Marland Kitchen atorvastatin (LIPITOR) 20 MG tablet Take 1 tablet (20 mg total) by mouth daily.  . Biotin 1000 MCG tablet Take 1,000 mcg by mouth 3 (three) times daily.  Marland Kitchen buPROPion (WELLBUTRIN XL) 300 MG 24 hr tablet Take 1 tablet (300 mg total) by mouth daily.  . cetirizine (ZYRTEC) 10 MG tablet TAKE 1 TABLET BY MOUTH DAILY  . diazepam (VALIUM) 10 MG tablet   . fluticasone (FLONASE) 50 MCG/ACT nasal spray 2 spray each nostril qd  . HYDROcodone-acetaminophen (NORCO) 7.5-325 MG  per tablet Take 1 tablet by mouth every 6 (six) hours as needed. 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 levonorgestrel (MIRENA) 20 MCG/24HR IUD 1 each by Intrauterine route once.    . Multiple Vitamins-Minerals (HAIR/SKIN/NAILS PO) Take by mouth.    . Naphazoline-Pheniramine (OPCON-A) 0.027-0.315 % SOLN Use as directed to eye  . polyvinyl alcohol-povidone (REFRESH) 1.4-0.6 % ophthalmic solution Place 1-2 drops into both eyes 2 (two) times daily as needed.  . Probiotic Product (PROBIOTIC FORMULA) CAPS Take 1 capsule by mouth daily.  Marland Kitchen telmisartan-hydrochlorothiazide (MICARDIS HCT) 80-12.5 MG per tablet Take 1 tablet by mouth daily.  Marland Kitchen zolpidem (AMBIEN) 5 MG tablet Take 1 at night as needed for sleep limit to 3 x per week.  . [DISCONTINUED] buPROPion (WELLBUTRIN XL) 300 MG 24 hr tablet Take 1 tablet (300 mg total) by mouth daily.  . [DISCONTINUED] cetirizine (ZYRTEC) 10 MG tablet TAKE 1 TABLET BY MOUTH DAILY  . [DISCONTINUED] Probiotic Product (PROBIOTIC FORMULA PO) Take by mouth.    . saxagliptin HCl  (ONGLYZA) 5 MG TABS tablet Take 1 tablet (5 mg total) by mouth daily.    EXAM:  BP 130/88  Temp(Src) 98.1 F (36.7 C) (Oral)  Ht 5' 6.5" (1.689 m)  Wt 239 lb (108.41 kg)  BMI 38.00 kg/m2  Body mass index is 38 kg/(m^2).  GENERAL: vitals reviewed and listed above, alert, oriented, appears well hydrated and in no acute distress NECK: no obvious masses on inspection palpation  LUNGS: clear to auscultation bilaterally, no wheezes, rales or rhonchi,CV: HRRR, no clubbing cyanosis or  peripheral edema nl cap refill  MS: moves all extremities without noticeable focal  Abnormality diabetic foot  Neg: except great nails curving in  PSYCH: pleasant and cooperative, no obvious depression or anxiety Lab Results  Component Value Date   WBC 6.5 03/04/2010   HGB 12.3 03/04/2010   HCT 36.7 03/04/2010   PLT 377.0 03/04/2010   GLUCOSE 128* 11/13/2013   CHOL 194 11/13/2013   TRIG 89.0 11/13/2013   HDL 40.20 11/13/2013   LDLDIRECT 162.5 03/07/2013   LDLCALC 136* 11/13/2013   ALT 15 07/10/2013   AST 19 07/10/2013   NA 139 11/13/2013   K 3.9 11/13/2013   CL 103 11/13/2013   CREATININE 1.1 11/13/2013   BUN 9 11/13/2013   CO2 29 11/13/2013   TSH 1.15 07/10/2013   HGBA1C 6.9* 11/13/2013   MICROALBUR 1.2 02/05/2009    ASSESSMENT AND PLAN:  Discussed the following assessment and plan:  Type 2 diabetes mellitus without complication - controlled  see pod about abnormal nail ? if onycho myc or other   Other and unspecified hyperlipidemia - missing doses  disc theis and fu to get to goal  Unspecified essential hypertension - pt says controlled when at home  Need for prophylactic vaccination and inoculation against influenza - Plan: Flu Vaccine QUAD 36+ mos IM  Obesity, unspecified  -Patient advised to return or notify health care team  if symptoms worsen ,persist or new concerns arise.  Patient Instructions  Intensify  Life style   And take the atorvastatin every day.    Consider  Increase dose .  Make sure bp  below 140/90   And medication  .  See podiatrist about the nail issue.  Labs pre visit in 4 months or so    Standley Brooking. Panosh M.D.

## 2013-11-25 NOTE — Patient Instructions (Signed)
Intensify  Life style   And take the atorvastatin every day.    Consider  Increase dose .  Make sure bp below 140/90   And medication  .  See podiatrist about the nail issue.  Labs pre visit in 4 months or so

## 2013-11-27 ENCOUNTER — Telehealth: Payer: Self-pay | Admitting: Internal Medicine

## 2013-11-27 NOTE — Telephone Encounter (Signed)
Do not see where [facility] has prescribed this medication.  Will send for review and authorization.

## 2013-11-27 NOTE — Telephone Encounter (Signed)
Pt request refill of the following:  polyvinyl alcohol-povidone (REFRESH) 1.4-0.6 % ophthalmic solution    Phamacy:  [facility] on  [address]

## 2013-11-28 NOTE — Telephone Encounter (Signed)
Pt notified refills should come from ophthalmologist.

## 2013-11-28 NOTE — Telephone Encounter (Signed)
I thought she said  was given by her eye doctor?   i fso they should do the rx .

## 2014-01-09 ENCOUNTER — Other Ambulatory Visit: Payer: Self-pay | Admitting: Internal Medicine

## 2014-01-09 NOTE — Telephone Encounter (Signed)
Sent to the pharmacy by e-scribe. 

## 2014-02-03 ENCOUNTER — Other Ambulatory Visit: Payer: Self-pay | Admitting: Internal Medicine

## 2014-02-03 ENCOUNTER — Encounter: Payer: Self-pay | Admitting: Internal Medicine

## 2014-02-03 NOTE — Telephone Encounter (Signed)
Sent to the pharmacy by e-scribe.  Pt has upcoming appt on 05/27/14 

## 2014-02-13 ENCOUNTER — Other Ambulatory Visit: Payer: Self-pay | Admitting: Obstetrics and Gynecology

## 2014-02-13 DIAGNOSIS — N63 Unspecified lump in unspecified breast: Secondary | ICD-10-CM

## 2014-03-11 ENCOUNTER — Ambulatory Visit
Admission: RE | Admit: 2014-03-11 | Discharge: 2014-03-11 | Disposition: A | Discharge status: Home / Self Care | Payer: 59 | Source: Ambulatory Visit | Attending: Obstetrics and Gynecology | Admitting: Obstetrics and Gynecology

## 2014-03-11 ENCOUNTER — Other Ambulatory Visit: Payer: Self-pay | Admitting: Obstetrics and Gynecology

## 2014-03-11 DIAGNOSIS — N63 Unspecified lump in unspecified breast: Secondary | ICD-10-CM

## 2014-03-19 ENCOUNTER — Other Ambulatory Visit: Payer: Self-pay | Admitting: Obstetrics and Gynecology

## 2014-03-19 DIAGNOSIS — N63 Unspecified lump in unspecified breast: Secondary | ICD-10-CM

## 2014-03-24 ENCOUNTER — Ambulatory Visit
Admission: RE | Admit: 2014-03-24 | Discharge: 2014-03-24 | Disposition: A | Discharge status: Home / Self Care | Payer: 59 | Source: Ambulatory Visit | Attending: Obstetrics and Gynecology | Admitting: Obstetrics and Gynecology

## 2014-03-24 DIAGNOSIS — N63 Unspecified lump in unspecified breast: Secondary | ICD-10-CM

## 2014-04-08 ENCOUNTER — Other Ambulatory Visit: Payer: Self-pay | Admitting: Internal Medicine

## 2014-04-08 NOTE — Telephone Encounter (Signed)
Sent to the pharmacy by e-scribe. 

## 2014-04-09 ENCOUNTER — Other Ambulatory Visit: Payer: Self-pay | Admitting: Internal Medicine

## 2014-04-09 NOTE — Telephone Encounter (Signed)
Sent to the pharmacy by e-scribe.  Pt has upcoming appt on 05/27/14

## 2014-05-12 ENCOUNTER — Other Ambulatory Visit (INDEPENDENT_AMBULATORY_CARE_PROVIDER_SITE_OTHER): Payer: 59

## 2014-05-12 DIAGNOSIS — E119 Type 2 diabetes mellitus without complications: Secondary | ICD-10-CM

## 2014-05-12 DIAGNOSIS — I10 Essential (primary) hypertension: Secondary | ICD-10-CM

## 2014-05-12 DIAGNOSIS — E785 Hyperlipidemia, unspecified: Secondary | ICD-10-CM

## 2014-05-12 LAB — BASIC METABOLIC PANEL
BUN: 14 mg/dL (ref 6–23)
CO2: 27 meq/L (ref 19–32)
CREATININE: 1.05 mg/dL (ref 0.40–1.20)
Calcium: 9.6 mg/dL (ref 8.4–10.5)
Chloride: 106 mEq/L (ref 96–112)
GFR: 71.16 mL/min (ref >60.00)
GLUCOSE: 136 mg/dL — AB (ref 70–99)
Potassium: 4.1 mEq/L (ref 3.5–5.1)
Sodium: 142 mEq/L (ref 135–145)

## 2014-05-12 LAB — LIPID PANEL
CHOL/HDL RATIO: 4
CHOLESTEROL: 171 mg/dL (ref 0–200)
HDL: 44.7 mg/dL (ref >39.00)
LDL Cholesterol: 103 mg/dL — ABNORMAL HIGH (ref 0–99)
NonHDL: 126.3
Triglycerides: 115 mg/dL (ref 0.0–149.0)
VLDL: 23 mg/dL (ref 0.0–40.0)

## 2014-05-12 LAB — HEMOGLOBIN A1C: Hgb A1c MFr Bld: 7.4 % — ABNORMAL HIGH (ref 4.6–6.5)

## 2014-05-27 ENCOUNTER — Encounter: Payer: Self-pay | Admitting: Internal Medicine

## 2014-05-27 ENCOUNTER — Ambulatory Visit (INDEPENDENT_AMBULATORY_CARE_PROVIDER_SITE_OTHER): Payer: 59 | Admitting: Internal Medicine

## 2014-05-27 VITALS — BP 124/84 | Temp 98.7°F | Ht 65.75 in | Wt 235.0 lb

## 2014-05-27 DIAGNOSIS — Z1211 Encounter for screening for malignant neoplasm of colon: Secondary | ICD-10-CM

## 2014-05-27 DIAGNOSIS — I10 Essential (primary) hypertension: Secondary | ICD-10-CM

## 2014-05-27 DIAGNOSIS — M542 Cervicalgia: Secondary | ICD-10-CM

## 2014-05-27 DIAGNOSIS — E119 Type 2 diabetes mellitus without complications: Secondary | ICD-10-CM | POA: Insufficient documentation

## 2014-05-27 DIAGNOSIS — E785 Hyperlipidemia, unspecified: Secondary | ICD-10-CM

## 2014-05-27 MED ORDER — ASPIRIN 81 MG PO TBEC: 81.0000 mg | DELAYED_RELEASE_TABLET | Freq: Every day | ORAL | Status: DC

## 2014-05-27 MED ORDER — SAXAGLIPTIN-METFORMIN ER 5-500 MG PO TB24: 1.0000 | ORAL_TABLET | Freq: Every day | ORAL | Status: DC

## 2014-05-27 MED ORDER — CETIRIZINE HCL 10 MG PO TABS: ORAL_TABLET | ORAL | Status: DC

## 2014-05-27 NOTE — Progress Notes (Signed)
Pre visit review using our clinic review tool, if applicable. No additional management support is needed unless otherwise documented below in the visit note.   Chief Complaint  Patient presents with  . Follow-up    HPI: Crystal Townsend  comes in today for follow up of  multiple medical problems.  DM  Last month ice cream every day and now stoppint this    bgs are   In the 50 range in am some lown to 90s  One 250 after bid meal and etoh after divorce  final     Stress but now better  LIPIDS:NO se of med Needs refill asa and zyrtec  Left side ear and neck arm pain for a week positional no injury rash or fever   ROS: See pertinent positives and negatives per HPI.  Past Medical History  Diagnosis Date  . Fatigue   . Snoring     had a sleep study mild osa 2009  . OSA (obstructive sleep apnea)   . Other dysfunctions of sleep stages or arousal from sleep   . Chest pain   . Impaired fasting glucose   . Hypertension   . Knee pain, bilateral   . Allergic rhinitis   . GERD (gastroesophageal reflux disease)   . Anxiety   . Leukoplakia oral mucosa     2011  . IBS (irritable bowel syndrome)   . Menorrhagia   . Fibroids   . Dysmenorrhea   . Diabetes mellitus     Family History  Problem Relation Age of Onset  . Lung cancer Mother   . Cancer Mother     lung   . Prostate cancer Father   . Cancer Father     prostate  . Diabetes Father   . Hyperlipidemia Father   . Hypertension Father   . Asthma Father   . Cancer Maternal Aunt     breast  . Cancer Maternal Aunt     breast  . Cancer Maternal Aunt     breast    History   Social History  . Marital Status: Married    Spouse Name: N/A  . Number of Children: N/A  . Years of Education: N/A   Social History Main Topics  . Smoking status: Never Smoker   . Smokeless tobacco: Never Used  . Alcohol Use: Yes     Comment: social  . Drug Use: No  . Sexual Activity: Not Currently    Birth Control/ Protection: IUD     Comment:  mirena   Other Topics Concern  . 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 acetaminophen (TYLENOL) 325 MG tablet Take 2 tablets (650 mg total) by mouth every 8 (eight) hours as needed for pain (headache).  Marland Kitchen aspirin 81 MG EC tablet TAKE 1 TABLET BY MOUTH DAILY  . atorvastatin (LIPITOR) 20 MG tablet Take 1 tablet by mouth  daily  . Biotin 1000 MCG tablet Take 1,000 mcg by mouth 3 (three) times daily.  Marland Kitchen buPROPion (WELLBUTRIN XL) 300 MG 24 hr tablet Take 1 tablet (300 mg total) by mouth daily.  . cetirizine (ZYRTEC) 10 MG tablet TAKE 1 TABLET BY MOUTH DAILY  .  diazepam (VALIUM) 10 MG tablet   . fluticasone (FLONASE) 50 MCG/ACT nasal spray 2 spray each nostril qd  . HYDROcodone-acetaminophen (NORCO) 7.5-325 MG per tablet Take 1 tablet by mouth every 6 (six) hours as needed. 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 levonorgestrel (MIRENA) 20 MCG/24HR IUD 1 each by Intrauterine route once.    . Multiple Vitamins-Minerals (HAIR/SKIN/NAILS PO) Take by mouth.    . Naphazoline-Pheniramine (OPCON-A) 0.027-0.315 % SOLN Use as directed to eye  . ONGLYZA 5 MG TABS tablet Take 1 tablet by mouth  daily  . polyvinyl alcohol-povidone (REFRESH) 1.4-0.6 % ophthalmic solution Place 1-2 drops into both eyes 2 (two) times daily as needed.  Marland Kitchen telmisartan-hydrochlorothiazide (MICARDIS HCT) 80-12.5 MG per tablet Take 1 tablet by mouth  daily  . zolpidem (AMBIEN) 5 MG tablet Take 1 at night as needed for sleep limit to 3 x per week.  . Saxagliptin-Metformin 5-500 MG TB24 Take 1 tablet by mouth daily.  . [DISCONTINUED] Probiotic Product (PROBIOTIC FORMULA) CAPS Take 1 capsule by mouth daily.    EXAM:  BP 124/84 mmHg  Temp(Src) 98.7 F (37.1  C) (Oral)  Ht 5' 5.75" (1.67 m)  Wt 235 lb (106.595 kg)  BMI 38.22 kg/m2  Body mass index is 38.22 kg/(m^2).  GENERAL: vitals reviewed and listed above, alert, oriented, appears well hydrated and in no acute distress HEENT: atraumatic, conjunctiva  clear, no obvious abnormalities on inspection of external nose and ears   Wax crumbling     Tm clear  No ear pain OP : no lesion edema or exudate  NECK: no obvious masses on inspection palpation  Some muscle spasm .  MS: moves all extremities without noticeable focal  abnormality PSYCH: pleasant and cooperative, no obvious depression or anxiety Lab Results  Component Value Date   WBC 6.5 03/04/2010   HGB 12.3 03/04/2010   HCT 36.7 03/04/2010   PLT 377.0 03/04/2010   GLUCOSE 136* 05/12/2014   CHOL 171 05/12/2014   TRIG 115.0 05/12/2014   HDL 44.70 05/12/2014   LDLDIRECT 162.5 03/07/2013   LDLCALC 103* 05/12/2014   ALT 15 07/10/2013   AST 19 07/10/2013   NA 142 05/12/2014   K 4.1 05/12/2014   CL 106 05/12/2014   CREATININE 1.05 05/12/2014   BUN 14 05/12/2014   CO2 27 05/12/2014   TSH 1.15 07/10/2013   HGBA1C 7.4* 05/12/2014   MICROALBUR 1.2 02/05/2009    ASSESSMENT AND PLAN:  Discussed the following assessment and plan:  Diabetes mellitus without complication - hx nausea with ir metfromin wiling to try xr  in combo  if fails this move  to other med groups and lsi  decline injectables   Essential hypertension  Hyperlipidemia  Neck pain on left side - radiating to ear seems ms    poss djd spasm no alarm features  Colon cancer screening - due by age .  - Plan: Ambulatory referral to Gastroenterology Pt tdeclined other med group willing to try ER metformin  And lsi before moving to  Other med groups  .  Will try comboglyz  And see how she dose and take with largest meal to dec potential se .  Tylenol positional  intervnetions for neck pain seems MS cause  ROv in 3 months  Ref for colonscoscopy    -Patient advised to  return or notify health care team  if symptoms worsen ,persist or new concerns arise.  Patient Instructions  Try  adding  er metformin in combo product  If tolerating continue with  Intensify lifestyle interventions. As we discussed   Labs hg a1c per visit in 3 months  We can refer to  Gi for routine  Colonoscopy.      Standley Brooking. Panosh M.D.

## 2014-05-27 NOTE — Patient Instructions (Addendum)
Try  adding er metformin in combo product  If tolerating continue with  Intensify lifestyle interventions. As we discussed   Labs hg a1c per visit in 3 months  We can refer to  Gi for routine  Colonoscopy.

## 2014-05-27 NOTE — Addendum Note (Signed)
Addended by: Miles Costain T on: 05/27/2014 10:07 AM   Modules accepted: Orders

## 2014-06-15 ENCOUNTER — Other Ambulatory Visit: Payer: Self-pay | Admitting: Internal Medicine

## 2014-06-17 NOTE — Telephone Encounter (Signed)
Sent to the pharmacy by e-scribe. 

## 2014-07-08 ENCOUNTER — Other Ambulatory Visit: Payer: Self-pay | Admitting: Internal Medicine

## 2014-07-08 NOTE — Telephone Encounter (Signed)
Sent to the pharmacy by e-scribe.  Pt has upcoming appt in May.

## 2014-08-08 ENCOUNTER — Other Ambulatory Visit (INDEPENDENT_AMBULATORY_CARE_PROVIDER_SITE_OTHER): Payer: 59

## 2014-08-08 DIAGNOSIS — I10 Essential (primary) hypertension: Secondary | ICD-10-CM

## 2014-08-08 DIAGNOSIS — E119 Type 2 diabetes mellitus without complications: Secondary | ICD-10-CM | POA: Diagnosis not present

## 2014-08-08 LAB — BASIC METABOLIC PANEL
BUN: 9 mg/dL (ref 6–23)
CO2: 30 mEq/L (ref 19–32)
CREATININE: 1.05 mg/dL (ref 0.40–1.20)
Calcium: 10.2 mg/dL (ref 8.4–10.5)
Chloride: 105 mEq/L (ref 96–112)
GFR: 71.09 mL/min (ref >60.00)
Glucose, Bld: 133 mg/dL — ABNORMAL HIGH (ref 70–99)
Potassium: 4.3 mEq/L (ref 3.5–5.1)
SODIUM: 143 meq/L (ref 135–145)

## 2014-08-08 LAB — HEMOGLOBIN A1C: Hgb A1c MFr Bld: 6.9 % — ABNORMAL HIGH (ref 4.6–6.5)

## 2014-08-19 ENCOUNTER — Encounter: Payer: Self-pay | Admitting: Internal Medicine

## 2014-08-19 ENCOUNTER — Ambulatory Visit (INDEPENDENT_AMBULATORY_CARE_PROVIDER_SITE_OTHER): Payer: 59 | Admitting: Internal Medicine

## 2014-08-19 VITALS — BP 130/90 | HR 81 | Temp 98.1°F | Wt 234.0 lb

## 2014-08-19 DIAGNOSIS — E119 Type 2 diabetes mellitus without complications: Secondary | ICD-10-CM

## 2014-08-19 DIAGNOSIS — Z79899 Other long term (current) drug therapy: Secondary | ICD-10-CM

## 2014-08-19 DIAGNOSIS — I10 Essential (primary) hypertension: Secondary | ICD-10-CM | POA: Diagnosis not present

## 2014-08-19 MED ORDER — FLUTICASONE PROPIONATE 50 MCG/ACT NA SUSP
NASAL | Status: DC
Start: 2014-08-19 — End: 2015-06-03

## 2014-08-19 NOTE — Progress Notes (Signed)
Chief Complaint  Patient presents with  . Follow-up    dm and bp    HPI: Crystal Townsend 49 y.o.  comes in for chronic disease/ medication management   Has missed some meds  Cause of  Fathers issue and graduation. No meds for a week  But before then ok and bp controlled 130/80 last week at [facility] . Didn't take the ombiglyze cause was scared of poss se. But taking onglyza up to last week . To get back on meds . Asks for refill flonase  As this helps her all rhiniti s  ROS: See pertinent positives and negatives per HPI.no cps spb feet issues vision change  ocass 1-2 min sharp head pain righ tfrontal not a ha and no ass [place]  . fyi no neuro sx  Has graduated   And feels very good a bout this  Son is working  So far doing ok  Father stage 4 cancer lives in [place].  Notvalium and pain mmed was for dental work no a reg med  Past Medical History  Diagnosis Date  . Fatigue   . Snoring     had a sleep study mild osa 2009  . OSA (obstructive sleep apnea)   . Other dysfunctions of sleep stages or arousal from sleep   . Chest pain   . Impaired fasting glucose   . Hypertension   . Knee pain, bilateral   . Allergic rhinitis   . GERD (gastroesophageal reflux disease)   . Anxiety   . Leukoplakia oral mucosa     2011  . IBS (irritable bowel syndrome)   . Menorrhagia   . Fibroids   . Dysmenorrhea   . Diabetes mellitus     Family History  Problem Relation Age of Onset  . Lung cancer Mother   . Cancer Mother     lung   . Prostate cancer Father   . Cancer Father     prostate  . Diabetes Father   . Hyperlipidemia Father   . Hypertension Father   . Asthma Father   . Cancer Maternal Aunt     breast  . Cancer Maternal Aunt     breast  . Cancer Maternal Aunt     breast    History   Social History  . Marital Status: Married    Spouse Name: N/A  . Number of Children: N/A  . Years of Education: N/A   Social History Main Topics  . Smoking status: Never Smoker   . Smokeless  tobacco: Never Used  . Alcohol Use: Yes     Comment: social  . Drug Use: No  . Sexual Activity: Not Currently    Birth Control/ Protection: IUD     Comment: mirena   Other Topics Concern  . 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 acetaminophen (TYLENOL) 325 MG tablet Take 2 tablets (650 mg total) by mouth every 8 (eight) hours as needed for pain (headache). 100 tablet 2  . aspirin 81 MG EC tablet Take 1 tablet (81 mg total) by mouth daily. Swallow whole. 100 tablet 2  . atorvastatin (LIPITOR) 20 MG tablet Take 1 tablet by mouth  daily 90 tablet 0  .  Biotin 1000 MCG tablet Take 1,000 mcg by mouth 3 (three) times daily.    Marland Kitchen buPROPion (WELLBUTRIN XL) 300 MG 24 hr tablet Take 1 tablet (300 mg total) by mouth daily. 30 tablet 5  . cetirizine (ZYRTEC) 10 MG tablet TAKE 1 TABLET BY MOUTH DAILY 30 tablet 11  . diazepam (VALIUM) 10 MG tablet     . HYDROcodone-acetaminophen (NORCO) 7.5-325 MG per tablet Take 1 tablet by mouth every 6 (six) hours as needed. 1 every 4-6 hours as needed for pain- Pension scheme manager    . levonorgestrel (MIRENA) 20 MCG/24HR IUD 1 each by Intrauterine route once.      . Multiple Vitamins-Minerals (HAIR/SKIN/NAILS PO) Take by mouth.      . Naphazoline-Pheniramine (OPCON-A) 0.027-0.315 % SOLN Use as directed to eye 1 Bottle 5  . ONGLYZA 5 MG TABS tablet Take 1 tablet by mouth  daily 90 tablet 0  . polyvinyl alcohol-povidone (REFRESH) 1.4-0.6 % ophthalmic solution Place 1-2 drops into both eyes 2 (two) times daily as needed.    Marland Kitchen telmisartan-hydrochlorothiazide (MICARDIS HCT) 80-12.5 MG per tablet Take 1 tablet by mouth  daily 90 tablet 0  . zolpidem (AMBIEN) 5 MG tablet Take 1 at night as needed for sleep limit to 3 x per week. 20 tablet 0  .  fluticasone (FLONASE) 50 MCG/ACT nasal spray 2 spray each nostril qd 16 g 3  . Saxagliptin-Metformin 5-500 MG TB24 Take 1 tablet by mouth daily. (Patient not taking: Reported on 08/19/2014) 30 tablet 3   No facility-administered medications prior to visit.     EXAM:  BP 130/90 mmHg  Pulse 81  Temp(Src) 98.1 F (36.7 C) (Oral)  Wt 234 lb (106.142 kg)  Body mass index is 38.06 kg/(m^2).  GENERAL: vitals reviewed and listed above, alert, oriented, appears well hydrated and in no acute distress HEENT: atraumatic, conjunctiva  clear, no obvious abnormalities on inspection of external nose and ears NECK: no obvious masses on inspection palpation  LUNGS: clear to auscultation bilaterally, no wheezes, rales or rhonchi, good air movement CV: HRRR, no clubbing cyanosis or  peripheral edema nl cap refill  MS: moves all extremities without noticeable focal  abnormality PSYCH: pleasant and cooperative, no obvious depression or anxiety Lab Results  Component Value Date   WBC 6.5 03/04/2010   HGB 12.3 03/04/2010   HCT 36.7 03/04/2010   PLT 377.0 03/04/2010   GLUCOSE 133* 08/08/2014   CHOL 171 05/12/2014   TRIG 115.0 05/12/2014   HDL 44.70 05/12/2014   LDLDIRECT 162.5 03/07/2013   LDLCALC 103* 05/12/2014   ALT 15 07/10/2013   AST 19 07/10/2013   NA 143 08/08/2014   K 4.3 08/08/2014   CL 105 08/08/2014   CREATININE 1.05 08/08/2014   BUN 9 08/08/2014   CO2 30 08/08/2014   TSH 1.15 07/10/2013   HGBA1C 6.9* 08/08/2014   MICROALBUR 1.2 02/05/2009   BP Readings from Last 3 Encounters:  08/19/14 130/90  05/27/14 124/84  11/25/13 130/88   Wt Readings from Last 3 Encounters:  08/19/14 234 lb (106.142 kg)  05/27/14 235 lb (106.595 kg)  11/25/13 239 lb (108.41 kg)     ASSESSMENT AND PLAN:  Discussed the following assessment and plan:  Diabetes mellitus without complication - better  get back to meds and lsi   Essential hypertension - better readings when didnt miss meds    Medication management  79mos wellness cpx labs and hga1c pre visit  -Patient advised to return or notify health  care team  if symptoms worsen ,persist or new concerns arise.  Patient Instructions  Pay attention  Now to life style . And your health .  And taking medications every day .   Can geg penumovax  At wellenss visit  Wellness visit and full labs a1c in about 6 months .     Standley Brooking. Panosh M.D.

## 2014-08-19 NOTE — Progress Notes (Signed)
Pre visit review using our clinic review tool, if applicable. No additional management support is needed unless otherwise documented below in the visit note. 

## 2014-08-19 NOTE — Patient Instructions (Signed)
Pay attention  Now to life style . And your health .  And taking medications every day .   Can geg penumovax  At wellenss visit  Wellness visit and full labs a1c in about 6 months .

## 2014-09-13 ENCOUNTER — Other Ambulatory Visit: Payer: Self-pay | Admitting: Internal Medicine

## 2014-09-15 NOTE — Telephone Encounter (Signed)
Sent to the pharmacy by e-scribe.  Pt has upcoming CPX on 03/06/15

## 2014-10-06 ENCOUNTER — Other Ambulatory Visit: Payer: Self-pay | Admitting: Internal Medicine

## 2014-10-08 NOTE — Telephone Encounter (Signed)
Sent to the pharmacy by e-scribe.  Pt has upcoming cpx on 03/06/15

## 2014-10-21 ENCOUNTER — Other Ambulatory Visit: Payer: Self-pay

## 2014-10-21 DIAGNOSIS — Z1231 Encounter for screening mammogram for malignant neoplasm of breast: Secondary | ICD-10-CM

## 2014-10-27 ENCOUNTER — Other Ambulatory Visit: Payer: Self-pay

## 2014-10-27 DIAGNOSIS — N644 Mastodynia: Secondary | ICD-10-CM

## 2014-10-30 ENCOUNTER — Other Ambulatory Visit: Payer: Self-pay | Admitting: Obstetrics and Gynecology

## 2014-10-30 ENCOUNTER — Ambulatory Visit
Admission: RE | Admit: 2014-10-30 | Discharge: 2014-10-30 | Disposition: A | Discharge status: Home / Self Care | Payer: 59 | Source: Ambulatory Visit

## 2014-10-30 ENCOUNTER — Other Ambulatory Visit: Payer: Self-pay

## 2014-10-30 DIAGNOSIS — N644 Mastodynia: Secondary | ICD-10-CM

## 2014-10-30 DIAGNOSIS — Z1231 Encounter for screening mammogram for malignant neoplasm of breast: Secondary | ICD-10-CM

## 2014-11-06 ENCOUNTER — Other Ambulatory Visit: Payer: Self-pay | Admitting: Obstetrics and Gynecology

## 2014-11-06 ENCOUNTER — Ambulatory Visit (INDEPENDENT_AMBULATORY_CARE_PROVIDER_SITE_OTHER): Payer: 59 | Admitting: Adult Health

## 2014-11-06 ENCOUNTER — Encounter: Payer: Self-pay | Admitting: Adult Health

## 2014-11-06 DIAGNOSIS — M791 Myalgia, unspecified site: Secondary | ICD-10-CM

## 2014-11-06 DIAGNOSIS — M25561 Pain in right knee: Secondary | ICD-10-CM

## 2014-11-06 DIAGNOSIS — N63 Unspecified lump in unspecified breast: Secondary | ICD-10-CM

## 2014-11-06 DIAGNOSIS — R928 Other abnormal and inconclusive findings on diagnostic imaging of breast: Secondary | ICD-10-CM

## 2014-11-06 MED ORDER — CYCLOBENZAPRINE HCL 10 MG PO TABS: 10.0000 mg | ORAL_TABLET | Freq: Every day | ORAL | Status: DC

## 2014-11-06 NOTE — Patient Instructions (Signed)
It was great meeting you today!  I have sent in a prescription for Flexeril, this muscle relaxer may make you feel sleepy so take it at night first to see how it affects you.   Take 600mg  Ibuprofen every 6 hours and apply ice to the area behind your knee for 20 minutes on and 20 minutes off during the evening.   If you do not have any relief, please let me know.

## 2014-11-06 NOTE — Progress Notes (Signed)
Subjective:    Patient ID: Crystal Townsend, female    DOB: 1965/08/31, 49 y.o.   MRN: [id number]  HPI  49 year old female who presents to the office today for right knee discomfort. She states " it feels like a band is tightening in the back of my knee." She has this discomfort every day for the last two months. It does not matter what activity she is doing, she can have the pain. The discomfort lasts for 15 minutes and then goes away. She feels as though her leg " gives out or buckles when I am standing." She has been walking and exercising more.   Denies any swelling,bruising, or trauma. She does not have any pain in the front of the knee. Has full ROM  She is also complaining of bilateral thigh pain. This has been an ongoing issue for the last two months. She is taking a statin.   Review of Systems  Constitutional: Negative.   Cardiovascular: Negative for leg swelling.  Musculoskeletal: Positive for myalgias.  Skin: Negative.   All other systems reviewed and are negative.  Past Medical History  Diagnosis Date  . Fatigue   . Snoring     had a sleep study mild osa 2009  . OSA (obstructive sleep apnea)   . Other dysfunctions of sleep stages or arousal from sleep   . Chest pain   . Impaired fasting glucose   . Hypertension   . Knee pain, bilateral   . Allergic rhinitis   . GERD (gastroesophageal reflux disease)   . Anxiety   . Leukoplakia oral mucosa     2011  . IBS (irritable bowel syndrome)   . Menorrhagia   . Fibroids   . Dysmenorrhea   . Diabetes mellitus     History   Social History  . Marital Status: Married    Spouse Name: N/A  . Number of Children: N/A  . Years of Education: N/A   Occupational History  . Not on file.   Social History Main Topics  . Smoking status: Never Smoker   . Smokeless tobacco: Never Used  . Alcohol Use: Yes     Comment: social  . Drug Use: No  . Sexual Activity: Not Currently    Birth Control/ Protection: IUD     Comment:  mirena   Other Topics Concern  . 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 Cesarean section  1997  . Ectopic pregnancy surgery  2000  . Oral biopsy      Family History  Problem Relation Age of Onset  . Lung cancer Mother   . Cancer Mother     lung   . Prostate cancer Father   . Cancer Father     prostate  . Diabetes Father   . Hyperlipidemia Father   . Hypertension Father   . Asthma Father   . Cancer Maternal Aunt     breast  . Cancer Maternal Aunt     breast  . Cancer Maternal Aunt     breast    Allergies  Allergen Reactions  . Lisinopril     REACTION: throat epiglottal? swelling ? from ace  . Dr Constance Holster evaluation.  10.09  .  Metformin     REACTION: nausea with 500 mg  Immediate release    Current Outpatient Prescriptions on File Prior to Visit  Medication Sig Dispense Refill  . acetaminophen (TYLENOL) 325 MG tablet Take 2 tablets (650 mg total) by mouth every 8 (eight) hours as needed for pain (headache). 100 tablet 2  . aspirin 81 MG EC tablet Take 1 tablet (81 mg total) by mouth daily. Swallow whole. 100 tablet 2  . atorvastatin (LIPITOR) 20 MG tablet Take 1 tablet by mouth  daily 90 tablet 1  . Biotin 1000 MCG tablet Take 1,000 mcg by mouth 3 (three) times daily.    Marland Kitchen buPROPion (WELLBUTRIN XL) 300 MG 24 hr tablet Take 1 tablet (300 mg total) by mouth daily. 30 tablet 5  . cetirizine (ZYRTEC) 10 MG tablet TAKE 1 TABLET BY MOUTH DAILY 30 tablet 11  . diazepam (VALIUM) 10 MG tablet     . fluticasone (FLONASE) 50 MCG/ACT nasal spray 2 spray each nostril qd 16 g 11  . HYDROcodone-acetaminophen (NORCO) 7.5-325 MG per tablet Take 1 tablet by mouth every 6 (six) hours as needed. 1 every 4-6 hours as needed for pain- Pension scheme manager    . levonorgestrel (MIRENA) 20  MCG/24HR IUD 1 each by Intrauterine route once.      . Multiple Vitamins-Minerals (HAIR/SKIN/NAILS PO) Take by mouth.      . Naphazoline-Pheniramine (OPCON-A) 0.027-0.315 % SOLN Use as directed to eye 1 Bottle 5  . ONGLYZA 5 MG TABS tablet Take 1 tablet by mouth  daily 90 tablet 1  . polyvinyl alcohol-povidone (REFRESH) 1.4-0.6 % ophthalmic solution Place 1-2 drops into both eyes 2 (two) times daily as needed.    Marland Kitchen telmisartan-hydrochlorothiazide (MICARDIS HCT) 80-12.5 MG per tablet Take 1 tablet by mouth  daily 90 tablet 1  . zolpidem (AMBIEN) 5 MG tablet Take 1 at night as needed for sleep limit to 3 x per week. 20 tablet 0   No current facility-administered medications on file prior to visit.    There were no vitals taken for this visit.       Objective:   Physical Exam  Constitutional: She is oriented to person, place, and time. She appears well-developed and well-nourished. No distress.  Musculoskeletal: Normal range of motion. She exhibits no edema or tenderness.  Has full ROM in right knee. Only has "pulling "pain with extension. Slight discomfort when standing.   No noticeable masses behind the knee. No pain with palpation.   Neurological: She is alert and oriented to person, place, and time.  Skin: Skin is warm and dry. No rash noted. She is not diaphoretic. No erythema. No pallor.  Psychiatric: She has a normal mood and affect. Her behavior is normal. Judgment and thought content normal.  Nursing note and vitals reviewed.      Assessment & Plan:  1. Right knee pain - Likely MSK in nature. Could be bakers cyct. Less likely ACL or MCL tear.  - cyclobenzaprine (FLEXERIL) 10 MG tablet; Take 1 tablet (10 mg total) by mouth at bedtime.  Dispense: 30 tablet; Refill: 0 - Ibuprofen 60mg  Q8hours PRN - Ice and rest  - Follow up if no improvement.   2. Muscle pain - Take statin holiday for one month.  - If pain decreases after stopping statin medication please follow up with  PCP.

## 2014-11-07 ENCOUNTER — Ambulatory Visit: Payer: 59 | Admitting: Internal Medicine

## 2014-11-14 ENCOUNTER — Other Ambulatory Visit: Payer: 59

## 2014-12-04 ENCOUNTER — Ambulatory Visit
Admission: RE | Admit: 2014-12-04 | Discharge: 2014-12-04 | Disposition: A | Discharge status: Home / Self Care | Payer: 59 | Source: Ambulatory Visit | Attending: Obstetrics and Gynecology | Admitting: Obstetrics and Gynecology

## 2014-12-04 DIAGNOSIS — N63 Unspecified lump in unspecified breast: Secondary | ICD-10-CM

## 2014-12-04 DIAGNOSIS — R928 Other abnormal and inconclusive findings on diagnostic imaging of breast: Secondary | ICD-10-CM

## 2014-12-04 MED ORDER — GADOBENATE DIMEGLUMINE 529 MG/ML IV SOLN: 20.0000 mL | Freq: Once | INTRAVENOUS | Status: DC | PRN

## 2014-12-09 ENCOUNTER — Other Ambulatory Visit: Payer: Self-pay | Admitting: Obstetrics and Gynecology

## 2014-12-09 DIAGNOSIS — R928 Other abnormal and inconclusive findings on diagnostic imaging of breast: Secondary | ICD-10-CM

## 2014-12-11 ENCOUNTER — Ambulatory Visit
Admission: RE | Admit: 2014-12-11 | Discharge: 2014-12-11 | Disposition: A | Discharge status: Home / Self Care | Payer: 59 | Source: Ambulatory Visit | Attending: Obstetrics and Gynecology | Admitting: Obstetrics and Gynecology

## 2014-12-11 DIAGNOSIS — R928 Other abnormal and inconclusive findings on diagnostic imaging of breast: Secondary | ICD-10-CM

## 2014-12-11 MED ORDER — GADOBENATE DIMEGLUMINE 529 MG/ML IV SOLN: 20.0000 mL | Freq: Once | INTRAVENOUS | Status: DC | PRN

## 2014-12-24 ENCOUNTER — Encounter (HOSPITAL_COMMUNITY): Payer: Self-pay | Admitting: Family Medicine

## 2014-12-24 ENCOUNTER — Telehealth: Payer: Self-pay | Admitting: Internal Medicine

## 2014-12-24 ENCOUNTER — Emergency Department (HOSPITAL_COMMUNITY): Payer: 59

## 2014-12-24 ENCOUNTER — Emergency Department (HOSPITAL_COMMUNITY)
Admission: EM | Admit: 2014-12-24 | Discharge: 2014-12-24 | Disposition: A | Discharge status: Home / Self Care | Payer: 59 | Attending: Emergency Medicine | Admitting: Emergency Medicine

## 2014-12-24 DIAGNOSIS — Z7982 Long term (current) use of aspirin: Secondary | ICD-10-CM | POA: Diagnosis not present

## 2014-12-24 DIAGNOSIS — Z793 Long term (current) use of hormonal contraceptives: Secondary | ICD-10-CM | POA: Diagnosis not present

## 2014-12-24 DIAGNOSIS — Z86018 Personal history of other benign neoplasm: Secondary | ICD-10-CM | POA: Diagnosis not present

## 2014-12-24 DIAGNOSIS — F419 Anxiety disorder, unspecified: Secondary | ICD-10-CM | POA: Diagnosis not present

## 2014-12-24 DIAGNOSIS — E119 Type 2 diabetes mellitus without complications: Secondary | ICD-10-CM | POA: Diagnosis not present

## 2014-12-24 DIAGNOSIS — R630 Anorexia: Secondary | ICD-10-CM | POA: Diagnosis not present

## 2014-12-24 DIAGNOSIS — I10 Essential (primary) hypertension: Secondary | ICD-10-CM | POA: Insufficient documentation

## 2014-12-24 DIAGNOSIS — Z8669 Personal history of other diseases of the nervous system and sense organs: Secondary | ICD-10-CM | POA: Diagnosis not present

## 2014-12-24 DIAGNOSIS — Z79899 Other long term (current) drug therapy: Secondary | ICD-10-CM | POA: Diagnosis not present

## 2014-12-24 DIAGNOSIS — Z7951 Long term (current) use of inhaled steroids: Secondary | ICD-10-CM | POA: Diagnosis not present

## 2014-12-24 DIAGNOSIS — Z8719 Personal history of other diseases of the digestive system: Secondary | ICD-10-CM | POA: Diagnosis not present

## 2014-12-24 DIAGNOSIS — Z8742 Personal history of other diseases of the female genital tract: Secondary | ICD-10-CM | POA: Insufficient documentation

## 2014-12-24 DIAGNOSIS — R079 Chest pain, unspecified: Secondary | ICD-10-CM | POA: Insufficient documentation

## 2014-12-24 LAB — CBC
HCT: 37.3 % (ref 36.0–46.0)
HEMOGLOBIN: 12 g/dL (ref 12.0–15.0)
MCH: 28.5 pg (ref 26.0–34.0)
MCHC: 32.2 g/dL (ref 30.0–36.0)
MCV: 88.6 fL (ref 78.0–100.0)
Platelets: 410 10*3/uL — ABNORMAL HIGH (ref 150–400)
RBC: 4.21 MIL/uL (ref 3.87–5.11)
RDW: 13.3 % (ref 11.5–15.5)
WBC: 5.1 10*3/uL (ref 4.0–10.5)

## 2014-12-24 LAB — BASIC METABOLIC PANEL
ANION GAP: 9 (ref 5–15)
BUN: 7 mg/dL (ref 6–20)
CALCIUM: 9.1 mg/dL (ref 8.9–10.3)
CO2: 28 mmol/L (ref 22–32)
Chloride: 101 mmol/L (ref 101–111)
Creatinine, Ser: 1.04 mg/dL — ABNORMAL HIGH (ref 0.44–1.00)
GFR calc non Af Amer: >60 mL/min (ref >60)
Glucose, Bld: 158 mg/dL — ABNORMAL HIGH (ref 65–99)
POTASSIUM: 3.7 mmol/L (ref 3.5–5.1)
Sodium: 138 mmol/L (ref 135–145)

## 2014-12-24 LAB — I-STAT TROPONIN, ED: TROPONIN I, POC: 0 ng/mL (ref 0.00–0.08)

## 2014-12-24 MED ORDER — GI COCKTAIL ~~LOC~~
30.0000 mL | Freq: Once | ORAL | Status: AC
  Administered 2014-12-24: 30 mL via ORAL
  Filled 2014-12-24: qty 30

## 2014-12-24 NOTE — Telephone Encounter (Signed)
Pt seen in ED 

## 2014-12-24 NOTE — ED Notes (Signed)
Pt here for chest pain, headache and elevated BP. sts also some pain on the left side. sts yesterday she was in a restaurant and her chest became tight.

## 2014-12-24 NOTE — Discharge Instructions (Signed)

## 2014-12-24 NOTE — ED Provider Notes (Signed)
CSN: [id number]     Arrival date & time 12/24/14  1239 History   First MD Initiated Contact with Patient 12/24/14 1323     Chief Complaint  Patient presents with  . Chest Pain     (Consider location/radiation/quality/duration/timing/severity/associated sxs/prior Treatment) Patient is a 49 y.o. female presenting with chest pain. The history is provided by the patient.  Chest Pain Associated symptoms: no abdominal pain, no back pain, no headache, no nausea, no numbness, no shortness of breath, not vomiting and no weakness    patient presents with chest pain. Began yesterday. Has been going across license around dinnertime last night. Went from her mid chest up to her neck. It was burning. It was worse after eating dinner left-sided states that her to eat. No shortness of breath. Pain is still there but much improved. No diaphoresis. No known cardiac disease. Does have a history of GERD. She has not eaten today. States she had the feeling she was going to die yesterday.  Past Medical History  Diagnosis Date  . Fatigue   . Snoring     had a sleep study mild osa 2009  . OSA (obstructive sleep apnea)   . Other dysfunctions of sleep stages or arousal from sleep   . Chest pain   . Impaired fasting glucose   . Hypertension   . Knee pain, bilateral   . Allergic rhinitis   . GERD (gastroesophageal reflux disease)   . Anxiety   . Leukoplakia oral mucosa     2011  . IBS (irritable bowel syndrome)   . Menorrhagia   . Fibroids   . Dysmenorrhea   . Diabetes mellitus    Past Surgical History  Procedure Laterality Date  . Cesarean section  1997  . Ectopic pregnancy surgery  2000  . Oral biopsy     Family History  Problem Relation Age of Onset  . Lung cancer Mother   . Cancer Mother     lung   . Prostate cancer Father   . Cancer Father     prostate  . Diabetes Father   . Hyperlipidemia Father   . Hypertension Father   . Asthma Father   . Cancer Maternal Aunt     breast  . Cancer  Maternal Aunt     breast  . Cancer Maternal Aunt     breast   Social History  Substance Use Topics  . Smoking status: Never Smoker   . Smokeless tobacco: Never Used  . Alcohol Use: Yes     Comment: social   OB History    Gravida Para Term Preterm AB TAB SAB Ectopic Multiple Living   1 1        1      Review of Systems  Constitutional: Negative for activity change and appetite change.  Eyes: Negative for pain.  Respiratory: Negative for chest tightness and shortness of breath.   Cardiovascular: Positive for chest pain. Negative for leg swelling.  Gastrointestinal: Negative for nausea, vomiting, abdominal pain and diarrhea.  Genitourinary: Negative for flank pain.  Musculoskeletal: Negative for back pain and neck stiffness.  Skin: Negative for rash.  Neurological: Negative for weakness, numbness and headaches.  Psychiatric/Behavioral: Negative for behavioral problems.      Allergies  Lisinopril and Metformin  Home Medications   Prior to Admission medications   Medication Sig Start Date End Date Taking? Authorizing Provider  acetaminophen (TYLENOL) 325 MG tablet Take 2 tablets (650 mg total) by mouth every 8 (eight)  hours as needed for pain (headache). 04/23/12   Burnis Medin, MD  aspirin 81 MG EC tablet Take 1 tablet (81 mg total) by mouth daily. Swallow whole. 05/27/14   Burnis Medin, MD  atorvastatin (LIPITOR) 20 MG tablet Take 1 tablet by mouth  daily 10/08/14   Burnis Medin, MD  Biotin 1000 MCG tablet Take 1,000 mcg by mouth 3 (three) times daily.    Historical Provider, MD  buPROPion (WELLBUTRIN XL) 300 MG 24 hr tablet Take 1 tablet (300 mg total) by mouth daily. 11/25/13   Burnis Medin, MD  cetirizine (ZYRTEC) 10 MG tablet TAKE 1 TABLET BY MOUTH DAILY 05/27/14   Burnis Medin, MD  cyclobenzaprine (FLEXERIL) 10 MG tablet Take 1 tablet (10 mg total) by mouth at bedtime. 11/06/14   Dorothyann Peng, NP  diazepam (VALIUM) 10 MG tablet  05/17/13   Historical Provider, MD   fluticasone (FLONASE) 50 MCG/ACT nasal spray 2 spray each nostril qd 08/19/14   Burnis Medin, MD  HYDROcodone-acetaminophen (NORCO) 7.5-325 MG per tablet Take 1 tablet by mouth every 6 (six) hours as needed. 1 every 4-6 hours as needed for pain- [place] Provider, MD  levonorgestrel (MIRENA) 20 MCG/24HR IUD 1 each by Intrauterine route once.      Historical Provider, MD  Multiple Vitamins-Minerals (HAIR/SKIN/NAILS PO) Take by mouth.      Historical Provider, MD  Naphazoline-Pheniramine (OPCON-A) 0.027-0.315 % SOLN Use as directed to eye 08/08/13   Burnis Medin, MD  ONGLYZA 5 MG TABS tablet Take 1 tablet by mouth  daily 09/15/14   Burnis Medin, MD  polyvinyl alcohol-povidone (REFRESH) 1.4-0.6 % ophthalmic solution Place 1-2 drops into both eyes 2 (two) times daily as needed.    Historical Provider, MD  telmisartan-hydrochlorothiazide (MICARDIS HCT) 80-12.5 MG per tablet Take 1 tablet by mouth  daily 09/15/14   Burnis Medin, MD  zolpidem (AMBIEN) 5 MG tablet Take 1 at night as needed for sleep limit to 3 x per week. 11/20/12   Burnis Medin, MD   BP 119/74 mmHg  Pulse 65  Temp(Src) 98 F (36.7 C) (Oral)  Resp 18  SpO2 97% Physical Exam  Constitutional: She is oriented to person, place, and time. She appears well-developed and well-nourished.  HENT:  Head: Normocephalic and atraumatic.  Eyes: EOM are normal. Pupils are equal, round, and reactive to light.  Neck: Normal range of motion. Neck supple.  Cardiovascular: Normal rate, regular rhythm and normal heart sounds.   No murmur heard. Pulmonary/Chest: Effort normal and breath sounds normal. No respiratory distress. She has no wheezes. She has no rales.  Abdominal: Soft. Bowel sounds are normal. She exhibits no distension. There is no tenderness.  Musculoskeletal: Normal range of motion.  Neurological: She is alert and oriented to person, place, and time. No cranial nerve deficit.  Skin: Skin is warm and dry.   Psychiatric: She has a normal mood and affect. Her speech is normal.  Nursing note and vitals reviewed.   ED Course  Procedures (including critical care time) Labs Review Labs Reviewed  BASIC METABOLIC PANEL - Abnormal; Notable for the following:    Glucose, Bld 158 (*)    Creatinine, Ser 1.04 (*)    All other components within normal limits  CBC - Abnormal; Notable for the following:    Platelets 410 (*)    All other components within normal limits  I-STAT TROPOININ, ED  Imaging Review Dg Chest 2 View  12/24/2014   CLINICAL DATA:  Chest pain, headache, hypertension.  EXAM: CHEST  2 VIEW  COMPARISON:  05/06/2004  FINDINGS: The heart size and mediastinal contours are within normal limits. Both lungs are clear. The visualized skeletal structures are unremarkable.  IMPRESSION: No active cardiopulmonary disease.   Electronically Signed   By: Rolm Baptise M.D.   On: 12/24/2014 13:32   I have personally reviewed and evaluated these images and lab results as part of my medical decision-making.   EKG Interpretation   Date/Time:  Wednesday December 24 2014 12:47:11 EDT Ventricular Rate:  89 PR Interval:  158 QRS Duration: 90 QT Interval:  372 QTC Calculation: 452 R Axis:   76 Text Interpretation:  Normal sinus rhythm Normal ECG Confirmed by  PICKERING  MD, Ovid Curd [phone]) on 12/24/2014 1:23:31 PM      MDM   Final diagnoses:  Chest pain, unspecified chest pain type    Patient with chest pain. ekg reassuring. Doubt cardiac cause. Will d/c. Feels better. Pain since yesterday    Davonna Belling, MD 12/24/14 1531

## 2014-12-24 NOTE — Telephone Encounter (Signed)
Patient Name: Crystal Townsend DOB: January 30, 1966 Initial Comment Caller states was in a restaurant yesterday, started feeling like her chest was getting tight, couldn't swallow, felt like she was going to pass out. Chest tight this morning, not as tight as yesterday Nurse Assessment Nurse: Ronnald Ramp, RN, Miranda Date/Time (Eastern Time): 12/24/2014 8:18:35 AM Confirm and document reason for call. If symptomatic, describe symptoms. ---Caller states she started having symptoms while eating. She had tightness in her chest, felt like some trouble swallowing, and like she was going to pass out. A few nights ago she had 1 episode of sharp pain in the left side of chest that last about 5 min. Has the patient traveled out of the country within the last 30 days? ---Not Applicable Does the patient require triage? ---Yes Related visit to physician within the last 2 weeks? ---No Does the PT have any chronic conditions? (i.e. diabetes, asthma, etc.) ---Yes List chronic conditions. ---HTN, Diabetes Did the patient indicate they were pregnant? ---No Guidelines Guideline Title Affirmed Question Affirmed Notes Anxiety and Panic Attack [1] Symptoms of anxiety or panic AND [2] has not been evaluated for this by physician Final Disposition User See PCP When Office is Open (within 3 days) Ronnald Ramp, RN, Miranda Comments Appts offered with Dr. Regis Bill for this week but pt unable to make any of those times. Offered appts with other providers and she declined, she states she only wants to see Dr. Regis Bill. Told caller I would make a note on the chart and ask that the provider review and contact her. Disagree/Comply: Disagree Disagree/Comply Reason: Wait and see

## 2014-12-24 NOTE — Telephone Encounter (Signed)
Patient Name: MAKHYA ARAVE DOB: 1965/04/09 Initial Comment caller states she just had her bp checked - 159/95, 161/91 - caller wants to know if these are within range for her Nurse Assessment Nurse: Vallery Sa, RN, [place] Date/Time Eilene Ghazi Time): 12/24/2014 11:53:52 AM Confirm and document reason for call. If symptomatic, describe symptoms. ---Caller states her blood pressure was 159-161/91-95 this morning. She developed chest tightness yesterday that is still present today. No severe breathing difficulty. Alert and responsive. No injury in the past 3 days. Has the patient traveled out of the country within the last 30 days? ---No Does the patient require triage? ---Yes Related visit to physician within the last 2 weeks? ---No Does the PT have any chronic conditions? (i.e. diabetes, asthma, etc.) ---Yes List chronic conditions. ---High Blood Pressure, Diabetes Did the patient indicate they were pregnant? ---No Guidelines Guideline Title Affirmed Question Affirmed Notes Chest Pain [1] Chest pain lasts > 5 minutes AND [2] described as crushing, pressure-like, or heavy pressure-like Final Disposition User Call EMS 911 Now Vallery Sa, RN, [place] Disagree/Comply: Juan Quam plans to have someone drive her to [facility] ER now.

## 2014-12-25 NOTE — Telephone Encounter (Signed)
Spoke with patient she will follow up in the next couple of weeks.

## 2014-12-25 NOTE — Telephone Encounter (Signed)
I see she went to ed  And had reassuring evaluation that not a cardicac cause .  This could have been esophageal spasm and anxiety .  She can make a fu visit  Next 1-2 weeks  To discuss  Or as needed

## 2014-12-26 ENCOUNTER — Ambulatory Visit: Payer: Self-pay | Admitting: Surgery

## 2014-12-26 DIAGNOSIS — D241 Benign neoplasm of right breast: Secondary | ICD-10-CM

## 2014-12-26 NOTE — H&P (Signed)
Crystal Townsend 12/26/2014 10:39 AM Location: [facility] Patient #: [id number] DOB: 02-17-1966 Single / Language: Cleophus Molt / Race: Black or African American Female History of Present Illness Marcello Moores A. Cornett MD; 12/26/2014 11:24 AM) Patient words: CLINICAL DATA: 49 year old female status post MRI guided right breast biopsy  EXAM: DIAGNOSTIC RIGHT MAMMOGRAM POST MRI BIOPSY  COMPARISON: Previous exam(s).  FINDINGS: Mammographic images were obtained following MRI guided biopsy of an indeterminate right breast mass. Post biopsy mammogram demonstrates the dumbbell-shaped biopsy marker to be approximately 2.5 cm medial to the expected location within the lower, slightly inner right breast.  IMPRESSION: Medial migration of the dumbbell-shaped biopsy marker post MRI guided biopsy as noted above.  Final Assessment: Post Procedure Mammograms for Marker Placement   Electronically Signed By: Pamelia Hoit M.D. On: 12/11/2014 09:24    Diagnosis Breast, right, needle core biopsy, lower, inner quadrant - INTRADUCTAL PAPILLOMA. Enid Cutter MD Pathologist, Electronic Signature (Case signed 12/12/2014)     Pt sent at the request of Dr Pamelia Hoit for right breast mass found on mammogram, U/S and MRI core biopsied to be a papilloma. Pt denies any nipple discharge or mass but has had some pain under her nipple. Denes any history of breast problems.       CLINICAL DATA: 49 year old African American female with a possible intraductal mass in the lower inner quadrant of the right breast. Ultrasound-guided core needle biopsy of this area in December, 2015, revealed benign adipose tissue and mildly dilated ducts. Followup diagnostic evaluation 10/30/2014 again suggested an approximate 4 mm intraductal mass at the 5 o'clock position approximately 3 cm from the nipple. MRI is requested for definitive diagnosis.  LABS: Creatinine were obtained on site at [facility]  at [address]  Results: Creatinine 1.0 mg/dL, estimated GFR 76.  EXAM: BILATERAL BREAST MRI WITH AND WITHOUT CONTRAST  TECHNIQUE: Multiplanar, multisequence MR images of both breasts were obtained prior to and following the intravenous administration of 20 ml of MultiHance.  THREE-DIMENSIONAL MR IMAGE RENDERING ON INDEPENDENT WORKSTATION:  Three-dimensional MR images were rendered by post-processing of the original MR data on an independent workstation. The three-dimensional MR images were interpreted, and findings are reported in the following complete MRI report for this study. Three dimensional images were evaluated at the independent DynaCad workstation.  COMPARISON: No prior MRI. Mammography 10/30/2014 and earlier. Right breast ultrasound 10/30/2014, 03/24/2014, 03/11/2014, 09/09/2013.  FINDINGS: Breast composition: b. Scattered fibroglandular tissue.  Background parenchymal enhancement: Moderate.  Right breast: Linear ductal enhancement beginning at the lower nipple and extending approximately 4.5 cm posteriorly, within which there is an enhancing mass measuring approximately 0.9 x 0.4 x 0.5 cm which demonstrates washout kinetics. No abnormal enhancement elsewhere.  Left breast: No mass or abnormal enhancement.  Lymph nodes: No pathologic lymphadenopathy.  Ancillary findings: None.  IMPRESSION: 1. Linear ductal enhancement beginning at the lower right nipple extending approximately 4.5 cm posteriorly, within which there is a 0.9 cm enhancing mass demonstrating suspicious washout kinetics. The enhancing mass likely corresponds with the sonographic findings and may represent a papilloma. The linear ductal enhancement is concerning for DCIS. 2. No suspicious findings elsewhere in either breast. 3. No pathologic lymphadenopathy.  RECOMMENDATION: MRI guided biopsy of the intraductal mass in the slight lower right breast. The biopsy should also  include the linear ductal enhancement.  BI-RADS CATEGORY 4: Suspicious.   Electronically Signed By: Evangeline Dakin M.D. On: 12/05/2014 10:07     CLINICAL DATA: Right subareolar breast pain. History of  benign right breast biopsy demonstrating ecstatic duct.  EXAM: DIGITAL DIAGNOSTIC BILATERAL MAMMOGRAM WITH 3D TOMOSYNTHESIS WITH CAD  ULTRASOUND RIGHT BREAST  COMPARISON: Previous exam(s).  ACR Breast Density Category b: There are scattered areas of fibroglandular density.  FINDINGS: There are no suspicious masses, areas of architectural distortion or microcalcifications in the left breast. 2 focal asymmetries measuring 5 and 6 mm are seen in the lower central right breast, anterior depth, 1 of which is associated with a dilated subareolar ducts. These areas are separate from the previously sampled area of duct ectasia, as evident by the post biopsy marker positioning.  Mammographic images were processed with CAD.  On physical exam, no suspicious masses are found.  Targeted ultrasound is performed, showing right breast 5 o'clock 3 cm from the nipple single duct ectasia with 4 mm intraductal hyperechoic filling defect. This finding has a very similar appearance to the previously biopsied right breast 5 o'clock ectatic duct, and likely represents the same finding. No sonographic correlation is found to the 2 newly apparent mammographic asymmetries.  IMPRESSION: Two right breast focal asymmetries, without sonographic correlation, for which further evaluation with breast MRI is recommended. If MRI is not performed, a 3D stereotactic core needle biopsy may be considered.  RECOMMENDATION: Breast MRI.  I have discussed the findings and recommendations with the patient. Results were also provided in writing at the conclusion of the visit. If applicable, a reminder letter will be sent to the patient regarding the next appointment.  BI-RADS CATEGORY 0:  Incomplete. Need additional imaging.  The patient is a 49 year old female   Other Problems Elbert Ewings, CMA; 12/26/2014 10:39 AM) Anxiety Disorder Depression Diabetes Mellitus Gastroesophageal Reflux Disease High blood pressure Sleep Apnea  Diagnostic Studies History Elbert Ewings, CMA; 12/26/2014 10:39 AM) Colonoscopy 5-10 years ago Mammogram within last year Pap Smear 1-5 years ago  Allergies Elbert Ewings, CMA; 12/26/2014 10:40 AM) Lisinopril *ANTIHYPERTENSIVES* MetFORMIN HCl *CHEMICALS*  Medication History Elbert Ewings, CMA; 12/26/2014 10:42 AM) Cetirizine HCl (10MG  Tablet, Oral) Active. Telmisartan-HCTZ (80-12.5MG  Tablet, Oral) Active. Onglyza (5MG  Tablet, Oral) Active. Fluticasone Propionate (50MCG/ACT Suspension, Nasal) Active. Mirena (52 MG) (20MCG/24HR IUD, Intrauterine) Active. Refresh (1.4-0.6% Solution, Ophthalmic) Active. Wellbutrin XL (300MG  Tablet ER 24HR, Oral) Active. Biotin (1000MCG Tablet, Oral) Active. Aspirin (81MG  Tablet, Oral as needed) Active. Medications Reconciled  Social History Elbert Ewings, [place]; 12/26/2014 10:39 AM) Alcohol use Moderate alcohol use. Caffeine use Carbonated beverages, Tea. No drug use Tobacco use Never smoker.  Family History Elbert Ewings, [place]; 12/26/2014 10:39 AM) Cancer Mother. Hypertension Father. Prostate Cancer Father.  Pregnancy / Birth History Elbert Ewings, CMA; 12/26/2014 10:39 AM) Age at menarche 51 years. Gravida 2 Irregular periods Maternal age 40-25 Para 1     Review of Systems Elbert Ewings CMA; 12/26/2014 10:39 AM) General Not Present- Appetite Loss, Chills, Fatigue, Fever, Night Sweats, Weight Gain and Weight Loss. Skin Not Present- Change in Wart/Mole, Dryness, Hives, Jaundice, New Lesions, Non-Healing Wounds, Rash and Ulcer. HEENT Present- Seasonal Allergies and Wears glasses/contact lenses. Not Present- Earache, Hearing Loss, Hoarseness, Nose Bleed, Oral Ulcers, Ringing in  the Ears, Sinus Pain, Sore Throat, Visual Disturbances and Yellow Eyes. Respiratory Present- Snoring. Not Present- Bloody sputum, Chronic Cough, Difficulty Breathing and Wheezing. Breast Present- Breast Pain. Not Present- Breast Mass, Nipple Discharge and Skin Changes. Cardiovascular Not Present- Chest Pain, Difficulty Breathing Lying Down, Leg Cramps, Palpitations, Rapid Heart Rate, Shortness of Breath and Swelling of Extremities. Gastrointestinal Not Present- Abdominal Pain, Bloating, Bloody Stool, Change in Bowel Habits,  Chronic diarrhea, Constipation, Difficulty Swallowing, Excessive gas, Gets full quickly at meals, Hemorrhoids, Indigestion, Nausea, Rectal Pain and Vomiting. Female Genitourinary Not Present- Frequency, Nocturia, Painful Urination, Pelvic Pain and Urgency. Musculoskeletal Present- Joint Pain and Joint Stiffness. Not Present- Back Pain, Muscle Pain, Muscle Weakness and Swelling of Extremities. Neurological Not Present- Decreased Memory, Fainting, Headaches, Numbness, Seizures, Tingling, Tremor, Trouble walking and Weakness. Psychiatric Present- Anxiety and Depression. Not Present- Bipolar, Change in Sleep Pattern, Fearful and Frequent crying. Endocrine Not Present- Cold Intolerance, Excessive Hunger, Hair Changes, Heat Intolerance, Hot flashes and New Diabetes. Hematology Not Present- Easy Bruising, Excessive bleeding, Gland problems, HIV and Persistent Infections.  Vitals Elbert Ewings CMA; 12/26/2014 10:42 AM) 12/26/2014 10:42 AM Weight: 231 lb Height: 65in Body Surface Area: 2.19 m Body Mass Index: 38.44 kg/m Temp.: 80F  Pulse: 70 (Regular)  BP: 132/62 (Sitting, Left Arm, Standard)     Physical Exam ([name] A. Cornett MD; 12/26/2014 11:24 AM)  General Mental Status-Alert. General Appearance-Consistent with stated age. Hydration-Well hydrated. Voice-Normal.  Head and Neck Head-normocephalic, atraumatic with no lesions or palpable  masses. Trachea-midline. Thyroid Gland Characteristics - normal size and consistency.  Eye Eyeball - Bilateral-Extraocular movements intact. Sclera/Conjunctiva - Bilateral-No scleral icterus.  Chest and Lung Exam Chest and lung exam reveals -quiet, even and easy respiratory effort with no use of accessory muscles and on auscultation, normal breath sounds, no adventitious sounds and normal vocal resonance. Inspection Chest Wall - Normal. Back - normal.  Breast Breast - Left-Symmetric, Non Tender, No Biopsy scars, no Dimpling, No Inflammation, No Lumpectomy scars, No Mastectomy scars, No Peau d' Orange. Breast - Right-Symmetric, Non Tender, No Biopsy scars, no Dimpling, No Inflammation, No Lumpectomy scars, No Mastectomy scars, No Peau d' Orange. Breast Lump-No Palpable Breast Mass.  Cardiovascular Cardiovascular examination reveals -normal heart sounds, regular rate and rhythm with no murmurs and normal pedal pulses bilaterally.  Abdomen Inspection Inspection of the abdomen reveals - No Hernias. Skin - Scar - no surgical scars. Palpation/Percussion Palpation and Percussion of the abdomen reveal - Soft, Non Tender, No Rebound tenderness, No Rigidity (guarding) and No hepatosplenomegaly. Auscultation Auscultation of the abdomen reveals - Bowel sounds normal.  Neurologic Neurologic evaluation reveals -alert and oriented x 3 with no impairment of recent or remote memory. Mental Status-Normal.  Musculoskeletal Normal Exam - Left-Upper Extremity Strength Normal and Lower Extremity Strength Normal. Normal Exam - Right-Upper Extremity Strength Normal and Lower Extremity Strength Normal.  Lymphatic Head & Neck  General Head & Neck Lymphatics: Bilateral - Description - Normal. Axillary  General Axillary Region: Bilateral - Description - Normal. Tenderness - Non Tender. Femoral & Inguinal  Generalized Femoral & Inguinal Lymphatics: Bilateral - Description -  Normal. Tenderness - Non Tender.    Assessment & Plan ([name] A. Cornett MD; 12/26/2014 11:23 AM)  PAPILLOMA OF BREAST (D24.9)  Current Plans Pt Education - [facility] Breast Biopsy HCI: discussed with patient and provided information.   The anatomy and the physiology was discussed. The pathophysiology and natural history of the disease was discussed. Options were discussed and recommendations were made. Technique, risks, benefits, & alternatives were discussed. Risks such as stroke, heart attack, bleeding, indection, death, and other risks discussed. Questions answered. The patient agrees to proceed. Pt Education - Breast Diseases: discussed with patient and provided information. PAPILLOMA OF RIGHT BREAST (D24.1) Impression: recommend excision due to small risk of malignancy Risk of lumpectomy include bleeding, infection, seroma, more surgery, use of seed/wire, wound care, cosmetic deformity and the need for other treatments,  death , blood clots, death. Pt agrees to proceed.

## 2014-12-27 ENCOUNTER — Ambulatory Visit (INDEPENDENT_AMBULATORY_CARE_PROVIDER_SITE_OTHER): Payer: Commercial Managed Care - HMO

## 2014-12-27 ENCOUNTER — Ambulatory Visit (INDEPENDENT_AMBULATORY_CARE_PROVIDER_SITE_OTHER): Payer: Commercial Managed Care - HMO | Admitting: Emergency Medicine

## 2014-12-27 VITALS — BP 128/86 | HR 75 | Temp 98.7°F | Resp 16 | Ht 65.75 in | Wt 231.0 lb

## 2014-12-27 DIAGNOSIS — M545 Low back pain, unspecified: Secondary | ICD-10-CM

## 2014-12-27 DIAGNOSIS — M542 Cervicalgia: Secondary | ICD-10-CM | POA: Diagnosis not present

## 2014-12-27 MED ORDER — CYCLOBENZAPRINE HCL 10 MG PO TABS: 10.0000 mg | ORAL_TABLET | Freq: Three times a day (TID) | ORAL | Status: DC | PRN

## 2014-12-27 MED ORDER — NAPROXEN SODIUM 550 MG PO TABS: 550.0000 mg | ORAL_TABLET | Freq: Two times a day (BID) | ORAL | Status: AC

## 2014-12-27 MED ORDER — TRAMADOL HCL 50 MG PO TABS: 50.0000 mg | ORAL_TABLET | Freq: Three times a day (TID) | ORAL | Status: DC | PRN
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
## 2014-12-27 NOTE — Patient Instructions (Signed)

## 2014-12-31 ENCOUNTER — Telehealth: Payer: Self-pay | Admitting: Radiology

## 2014-12-31 NOTE — Telephone Encounter (Signed)
At the patient's request, I made a copy of x-ray on CD, and handed to MR.

## 2015-01-14 ENCOUNTER — Telehealth: Payer: Self-pay | Admitting: Internal Medicine

## 2015-01-14 NOTE — Telephone Encounter (Signed)
Yes   Can   Book at  Thursday  December 1   If availbale and she wants this  Or other as a work in

## 2015-01-14 NOTE — Telephone Encounter (Signed)
Pt was sch for cpx on 03/06/15 and dr Regis Bill is not in office that day. Can I create 30 min slot?

## 2015-01-14 NOTE — Telephone Encounter (Signed)
Pt has been sch

## 2015-01-22 ENCOUNTER — Other Ambulatory Visit: Payer: Self-pay | Admitting: Surgery

## 2015-01-22 DIAGNOSIS — D241 Benign neoplasm of right breast: Secondary | ICD-10-CM

## 2015-01-28 LAB — HM DIABETES EYE EXAM

## 2015-01-29 ENCOUNTER — Encounter: Payer: Self-pay | Admitting: Family Medicine

## 2015-02-20 ENCOUNTER — Other Ambulatory Visit: Payer: 59

## 2015-02-24 ENCOUNTER — Encounter (HOSPITAL_BASED_OUTPATIENT_CLINIC_OR_DEPARTMENT_OTHER): Payer: Self-pay | Admitting: *Deleted

## 2015-03-02 ENCOUNTER — Encounter (HOSPITAL_BASED_OUTPATIENT_CLINIC_OR_DEPARTMENT_OTHER)
Admission: RE | Admit: 2015-03-02 | Discharge: 2015-03-02 | Disposition: A | Discharge status: Home / Self Care | Payer: Commercial Managed Care - HMO | Source: Ambulatory Visit | Attending: Surgery | Admitting: Surgery

## 2015-03-02 ENCOUNTER — Ambulatory Visit
Admission: RE | Admit: 2015-03-02 | Discharge: 2015-03-02 | Disposition: A | Discharge status: Home / Self Care | Payer: Commercial Managed Care - HMO | Source: Ambulatory Visit | Attending: Surgery | Admitting: Surgery

## 2015-03-02 DIAGNOSIS — K219 Gastro-esophageal reflux disease without esophagitis: Secondary | ICD-10-CM | POA: Diagnosis not present

## 2015-03-02 DIAGNOSIS — Z801 Family history of malignant neoplasm of trachea, bronchus and lung: Secondary | ICD-10-CM | POA: Diagnosis not present

## 2015-03-02 DIAGNOSIS — F418 Other specified anxiety disorders: Secondary | ICD-10-CM | POA: Diagnosis not present

## 2015-03-02 DIAGNOSIS — Z803 Family history of malignant neoplasm of breast: Secondary | ICD-10-CM | POA: Diagnosis not present

## 2015-03-02 DIAGNOSIS — D241 Benign neoplasm of right breast: Secondary | ICD-10-CM | POA: Diagnosis not present

## 2015-03-02 DIAGNOSIS — G4733 Obstructive sleep apnea (adult) (pediatric): Secondary | ICD-10-CM | POA: Diagnosis not present

## 2015-03-02 DIAGNOSIS — R5383 Other fatigue: Secondary | ICD-10-CM | POA: Diagnosis not present

## 2015-03-02 DIAGNOSIS — Z7982 Long term (current) use of aspirin: Secondary | ICD-10-CM | POA: Diagnosis not present

## 2015-03-02 DIAGNOSIS — K589 Irritable bowel syndrome without diarrhea: Secondary | ICD-10-CM | POA: Diagnosis not present

## 2015-03-02 DIAGNOSIS — Z8042 Family history of malignant neoplasm of prostate: Secondary | ICD-10-CM | POA: Diagnosis not present

## 2015-03-02 DIAGNOSIS — Z8249 Family history of ischemic heart disease and other diseases of the circulatory system: Secondary | ICD-10-CM | POA: Diagnosis not present

## 2015-03-02 DIAGNOSIS — E119 Type 2 diabetes mellitus without complications: Secondary | ICD-10-CM | POA: Diagnosis not present

## 2015-03-02 DIAGNOSIS — Z79899 Other long term (current) drug therapy: Secondary | ICD-10-CM | POA: Diagnosis not present

## 2015-03-02 DIAGNOSIS — Z833 Family history of diabetes mellitus: Secondary | ICD-10-CM | POA: Diagnosis not present

## 2015-03-02 DIAGNOSIS — I10 Essential (primary) hypertension: Secondary | ICD-10-CM | POA: Diagnosis not present

## 2015-03-02 LAB — CBC WITH DIFFERENTIAL/PLATELET
BASOS ABS: 0 10*3/uL (ref 0.0–0.1)
BASOS PCT: 0 %
Eosinophils Absolute: 0.2 10*3/uL (ref 0.0–0.7)
Eosinophils Relative: 3 %
HCT: 39.7 % (ref 36.0–46.0)
HEMOGLOBIN: 12.7 g/dL (ref 12.0–15.0)
LYMPHS ABS: 2 10*3/uL (ref 0.7–4.0)
Lymphocytes Relative: 34 %
MCH: 28.5 pg (ref 26.0–34.0)
MCHC: 32 g/dL (ref 30.0–36.0)
MCV: 89.2 fL (ref 78.0–100.0)
Monocytes Absolute: 0.3 10*3/uL (ref 0.1–1.0)
Monocytes Relative: 5 %
NEUTROS ABS: 3.5 10*3/uL (ref 1.7–7.7)
NEUTROS PCT: 58 %
PLATELETS: 410 10*3/uL — AB (ref 150–400)
RBC: 4.45 MIL/uL (ref 3.87–5.11)
RDW: 13.2 % (ref 11.5–15.5)
WBC: 6.1 10*3/uL (ref 4.0–10.5)

## 2015-03-02 LAB — COMPREHENSIVE METABOLIC PANEL
ALBUMIN: 4 g/dL (ref 3.5–5.0)
ALK PHOS: 83 U/L (ref 38–126)
ALT: 18 U/L (ref 14–54)
AST: 20 U/L (ref 15–41)
Anion gap: 7 (ref 5–15)
BUN: 7 mg/dL (ref 6–20)
CALCIUM: 9.6 mg/dL (ref 8.9–10.3)
CHLORIDE: 104 mmol/L (ref 101–111)
CO2: 29 mmol/L (ref 22–32)
CREATININE: 1.05 mg/dL — AB (ref 0.44–1.00)
GFR calc Af Amer: >60 mL/min (ref >60)
GFR calc non Af Amer: >60 mL/min (ref >60)
GLUCOSE: 123 mg/dL — AB (ref 65–99)
Potassium: 4.5 mmol/L (ref 3.5–5.1)
SODIUM: 140 mmol/L (ref 135–145)
Total Bilirubin: 0.3 mg/dL (ref 0.3–1.2)
Total Protein: 7.6 g/dL (ref 6.5–8.1)

## 2015-03-04 NOTE — Anesthesia Preprocedure Evaluation (Addendum)
Anesthesia Evaluation  Patient identified by MRN, date of birth, ID band Patient awake    Reviewed: Allergy & Precautions, Patient's Chart, lab work & pertinent test results  History of Anesthesia Complications Negative for: history of anesthetic complications  Airway Mallampati: Trach   Neck ROM: Full    Dental no notable dental hx. (+) Dental Advisory Given   Pulmonary sleep apnea ,    Pulmonary exam normal        Cardiovascular hypertension, Pt. on medications Normal cardiovascular exam     Neuro/Psych  Headaches, PSYCHIATRIC DISORDERS Anxiety Depression    GI/Hepatic Neg liver ROS, GERD  ,  Endo/Other  diabetesMorbid obesity  Renal/GU Renal InsufficiencyRenal disease     Musculoskeletal   Abdominal   Peds  Hematology   Anesthesia Other Findings   Reproductive/Obstetrics                            Anesthesia Physical Anesthesia Plan  ASA: III  Anesthesia Plan: General   Post-op Pain Management:    Induction: Intravenous  Airway Management Planned: LMA  Additional Equipment:   Intra-op Plan:   Post-operative Plan: Extubation in OR  Informed Consent: I have reviewed the patients History and Physical, chart, labs and discussed the procedure including the risks, benefits and alternatives for the proposed anesthesia with the patient or authorized representative who has indicated his/her understanding and acceptance.   Dental advisory given  Plan Discussed with:   Anesthesia Plan Comments:        Anesthesia Quick Evaluation

## 2015-03-05 ENCOUNTER — Encounter: Payer: 59 | Admitting: Internal Medicine

## 2015-03-05 ENCOUNTER — Ambulatory Visit (HOSPITAL_BASED_OUTPATIENT_CLINIC_OR_DEPARTMENT_OTHER): Payer: Commercial Managed Care - HMO | Admitting: Anesthesiology

## 2015-03-05 ENCOUNTER — Ambulatory Visit (HOSPITAL_BASED_OUTPATIENT_CLINIC_OR_DEPARTMENT_OTHER)
Admission: RE | Admit: 2015-03-05 | Discharge: 2015-03-05 | Disposition: A | Discharge status: Home / Self Care | Payer: Commercial Managed Care - HMO | Source: Ambulatory Visit | Attending: Surgery | Admitting: Surgery

## 2015-03-05 ENCOUNTER — Encounter (HOSPITAL_BASED_OUTPATIENT_CLINIC_OR_DEPARTMENT_OTHER): Payer: Self-pay

## 2015-03-05 ENCOUNTER — Ambulatory Visit
Admission: RE | Admit: 2015-03-05 | Discharge: 2015-03-05 | Disposition: A | Discharge status: Home / Self Care | Payer: Commercial Managed Care - HMO | Source: Ambulatory Visit | Attending: Surgery | Admitting: Surgery

## 2015-03-05 ENCOUNTER — Encounter (HOSPITAL_BASED_OUTPATIENT_CLINIC_OR_DEPARTMENT_OTHER)
Admission: RE | Disposition: A | Discharge status: Home / Self Care | Payer: Self-pay | Source: Ambulatory Visit | Attending: Surgery

## 2015-03-05 DIAGNOSIS — R5383 Other fatigue: Secondary | ICD-10-CM | POA: Insufficient documentation

## 2015-03-05 DIAGNOSIS — Z801 Family history of malignant neoplasm of trachea, bronchus and lung: Secondary | ICD-10-CM | POA: Insufficient documentation

## 2015-03-05 DIAGNOSIS — D241 Benign neoplasm of right breast: Secondary | ICD-10-CM | POA: Diagnosis not present

## 2015-03-05 DIAGNOSIS — Z8042 Family history of malignant neoplasm of prostate: Secondary | ICD-10-CM | POA: Insufficient documentation

## 2015-03-05 DIAGNOSIS — Z79899 Other long term (current) drug therapy: Secondary | ICD-10-CM | POA: Insufficient documentation

## 2015-03-05 DIAGNOSIS — E119 Type 2 diabetes mellitus without complications: Secondary | ICD-10-CM | POA: Insufficient documentation

## 2015-03-05 DIAGNOSIS — Z803 Family history of malignant neoplasm of breast: Secondary | ICD-10-CM | POA: Insufficient documentation

## 2015-03-05 DIAGNOSIS — G4733 Obstructive sleep apnea (adult) (pediatric): Secondary | ICD-10-CM | POA: Insufficient documentation

## 2015-03-05 DIAGNOSIS — Z7982 Long term (current) use of aspirin: Secondary | ICD-10-CM | POA: Insufficient documentation

## 2015-03-05 DIAGNOSIS — F418 Other specified anxiety disorders: Secondary | ICD-10-CM | POA: Insufficient documentation

## 2015-03-05 DIAGNOSIS — Z8249 Family history of ischemic heart disease and other diseases of the circulatory system: Secondary | ICD-10-CM | POA: Insufficient documentation

## 2015-03-05 DIAGNOSIS — I10 Essential (primary) hypertension: Secondary | ICD-10-CM | POA: Insufficient documentation

## 2015-03-05 DIAGNOSIS — Z833 Family history of diabetes mellitus: Secondary | ICD-10-CM | POA: Insufficient documentation

## 2015-03-05 DIAGNOSIS — K219 Gastro-esophageal reflux disease without esophagitis: Secondary | ICD-10-CM | POA: Insufficient documentation

## 2015-03-05 DIAGNOSIS — K589 Irritable bowel syndrome without diarrhea: Secondary | ICD-10-CM | POA: Insufficient documentation

## 2015-03-05 HISTORY — PX: BREAST LUMPECTOMY WITH RADIOACTIVE SEED LOCALIZATION: SHX6424

## 2015-03-05 HISTORY — DX: Major depressive disorder, single episode, unspecified: F32.9

## 2015-03-05 HISTORY — DX: Depression, unspecified: F32.A

## 2015-03-05 LAB — GLUCOSE, CAPILLARY
GLUCOSE-CAPILLARY: 112 mg/dL — AB (ref 65–99)
Glucose-Capillary: 127 mg/dL — ABNORMAL HIGH (ref 65–99)

## 2015-03-05 SURGERY — BREAST LUMPECTOMY WITH RADIOACTIVE SEED LOCALIZATION
Anesthesia: General | Site: Breast | Laterality: Right

## 2015-03-05 MED ORDER — FENTANYL CITRATE (PF) 100 MCG/2ML IJ SOLN: 50.0000 ug | INTRAMUSCULAR | Status: DC | PRN

## 2015-03-05 MED ORDER — ONDANSETRON HCL 4 MG/2ML IJ SOLN
INTRAMUSCULAR | Status: AC
  Filled 2015-03-05: qty 2

## 2015-03-05 MED ORDER — MIDAZOLAM HCL 2 MG/2ML IJ SOLN: 1.0000 mg | INTRAMUSCULAR | Status: DC | PRN

## 2015-03-05 MED ORDER — PROMETHAZINE HCL 25 MG/ML IJ SOLN: 6.2500 mg | INTRAMUSCULAR | Status: DC | PRN

## 2015-03-05 MED ORDER — BUPIVACAINE-EPINEPHRINE (PF) 0.25% -1:200000 IJ SOLN
INTRAMUSCULAR | Status: DC | PRN
  Administered 2015-03-05: 16 mL via PERINEURAL

## 2015-03-05 MED ORDER — SCOPOLAMINE 1 MG/3DAYS TD PT72
1.0000 | MEDICATED_PATCH | Freq: Once | TRANSDERMAL | Status: AC | PRN
  Administered 2015-03-05: 1 via TRANSDERMAL

## 2015-03-05 MED ORDER — OXYCODONE-ACETAMINOPHEN 5-325 MG PO TABS: 1.0000 | ORAL_TABLET | ORAL | Status: DC | PRN

## 2015-03-05 MED ORDER — FENTANYL CITRATE (PF) 100 MCG/2ML IJ SOLN
INTRAMUSCULAR | Status: AC
  Filled 2015-03-05: qty 2

## 2015-03-05 MED ORDER — ONDANSETRON HCL 4 MG/2ML IJ SOLN
INTRAMUSCULAR | Status: DC | PRN
  Administered 2015-03-05: 4 mg via INTRAVENOUS

## 2015-03-05 MED ORDER — CEFAZOLIN SODIUM-DEXTROSE 2-3 GM-% IV SOLR
INTRAVENOUS | Status: AC
  Filled 2015-03-05: qty 50

## 2015-03-05 MED ORDER — METHYLENE BLUE 1 % INJ SOLN
INTRAMUSCULAR | Status: AC
  Filled 2015-03-05: qty 10

## 2015-03-05 MED ORDER — EPHEDRINE SULFATE 50 MG/ML IJ SOLN
INTRAMUSCULAR | Status: DC | PRN
  Administered 2015-03-05: 10 mg via INTRAVENOUS
  Administered 2015-03-05: 5 mg via INTRAVENOUS
  Administered 2015-03-05: 10 mg via INTRAVENOUS

## 2015-03-05 MED ORDER — SCOPOLAMINE 1 MG/3DAYS TD PT72
MEDICATED_PATCH | TRANSDERMAL | Status: AC
  Filled 2015-03-05: qty 1

## 2015-03-05 MED ORDER — CHLORHEXIDINE GLUCONATE 4 % EX LIQD: 1.0000 "application " | Freq: Once | CUTANEOUS | Status: DC

## 2015-03-05 MED ORDER — LACTATED RINGERS IV SOLN
INTRAVENOUS | Status: DC
  Administered 2015-03-05: 08:00:00 via INTRAVENOUS
  Administered 2015-03-05: 10 mL/h via INTRAVENOUS

## 2015-03-05 MED ORDER — HYDROMORPHONE HCL 1 MG/ML IJ SOLN
0.2500 mg | INTRAMUSCULAR | Status: DC | PRN
  Administered 2015-03-05: 0.5 mg via INTRAVENOUS

## 2015-03-05 MED ORDER — MIDAZOLAM HCL 2 MG/2ML IJ SOLN
INTRAMUSCULAR | Status: AC
  Filled 2015-03-05: qty 2

## 2015-03-05 MED ORDER — FENTANYL CITRATE (PF) 100 MCG/2ML IJ SOLN
INTRAMUSCULAR | Status: DC | PRN
  Administered 2015-03-05: 50 ug via INTRAVENOUS

## 2015-03-05 MED ORDER — LIDOCAINE HCL (CARDIAC) 20 MG/ML IV SOLN
INTRAVENOUS | Status: AC
  Filled 2015-03-05: qty 5

## 2015-03-05 MED ORDER — SODIUM CHLORIDE 0.9 % IJ SOLN
INTRAMUSCULAR | Status: AC
  Filled 2015-03-05: qty 10

## 2015-03-05 MED ORDER — GLYCOPYRROLATE 0.2 MG/ML IJ SOLN: 0.2000 mg | Freq: Once | INTRAMUSCULAR | Status: DC | PRN

## 2015-03-05 MED ORDER — KETOROLAC TROMETHAMINE 30 MG/ML IJ SOLN
INTRAMUSCULAR | Status: DC | PRN
  Administered 2015-03-05: 30 mg via INTRAVENOUS

## 2015-03-05 MED ORDER — LIDOCAINE HCL (CARDIAC) 20 MG/ML IV SOLN
INTRAVENOUS | Status: DC | PRN
  Administered 2015-03-05: 80 mg via INTRAVENOUS

## 2015-03-05 MED ORDER — HYDROMORPHONE HCL 1 MG/ML IJ SOLN
INTRAMUSCULAR | Status: AC
  Filled 2015-03-05: qty 1

## 2015-03-05 MED ORDER — MIDAZOLAM HCL 5 MG/5ML IJ SOLN
INTRAMUSCULAR | Status: DC | PRN
  Administered 2015-03-05: 2 mg via INTRAVENOUS

## 2015-03-05 MED ORDER — DEXTROSE 5 % IV SOLN
3.0000 g | INTRAVENOUS | Status: AC
  Administered 2015-03-05: 3 g via INTRAVENOUS

## 2015-03-05 MED ORDER — CEFAZOLIN SODIUM 1-5 GM-% IV SOLN
INTRAVENOUS | Status: AC
  Filled 2015-03-05: qty 50

## 2015-03-05 MED ORDER — EPHEDRINE SULFATE 50 MG/ML IJ SOLN
INTRAMUSCULAR | Status: AC
  Filled 2015-03-05: qty 1

## 2015-03-05 MED ORDER — PROPOFOL 500 MG/50ML IV EMUL
INTRAVENOUS | Status: AC
  Filled 2015-03-05: qty 50

## 2015-03-05 MED ORDER — BUPIVACAINE-EPINEPHRINE (PF) 0.25% -1:200000 IJ SOLN
INTRAMUSCULAR | Status: AC
  Filled 2015-03-05: qty 30

## 2015-03-05 MED ORDER — PROPOFOL 10 MG/ML IV BOLUS
INTRAVENOUS | Status: DC | PRN
  Administered 2015-03-05: 200 mg via INTRAVENOUS

## 2015-03-05 SURGICAL SUPPLY — 46 items
APPLIER CLIP 9.375 MED OPEN (MISCELLANEOUS)
APR CLP MED 9.3 20 MLT OPN (MISCELLANEOUS)
BINDER BREAST LRG (GAUZE/BANDAGES/DRESSINGS) IMPLANT
BINDER BREAST MEDIUM (GAUZE/BANDAGES/DRESSINGS) IMPLANT
BINDER BREAST XLRG (GAUZE/BANDAGES/DRESSINGS) ×1 IMPLANT
BINDER BREAST XXLRG (GAUZE/BANDAGES/DRESSINGS) IMPLANT
BLADE SURG 15 STRL LF DISP TIS (BLADE) ×1 IMPLANT
BLADE SURG 15 STRL SS (BLADE) ×2
CANISTER SUC SOCK COL 7IN (MISCELLANEOUS) ×1 IMPLANT
CANISTER SUCT 1200ML W/VALVE (MISCELLANEOUS) IMPLANT
CHLORAPREP W/TINT 26ML (MISCELLANEOUS) ×2 IMPLANT
CLIP APPLIE 9.375 MED OPEN (MISCELLANEOUS) IMPLANT
COVER BACK TABLE 60X90IN (DRAPES) ×2 IMPLANT
COVER MAYO STAND STRL (DRAPES) ×2 IMPLANT
COVER PROBE W GEL 5X96 (DRAPES) ×2 IMPLANT
DECANTER SPIKE VIAL GLASS SM (MISCELLANEOUS) IMPLANT
DEVICE DUBIN W/COMP PLATE 8390 (MISCELLANEOUS) ×2 IMPLANT
DRAPE LAPAROSCOPIC ABDOMINAL (DRAPES) IMPLANT
DRAPE LAPAROTOMY 100X72 PEDS (DRAPES) ×2 IMPLANT
DRAPE UTILITY XL STRL (DRAPES) ×2 IMPLANT
ELECT COATED BLADE 2.86 ST (ELECTRODE) ×2 IMPLANT
ELECT REM PT RETURN 9FT ADLT (ELECTROSURGICAL) ×2
ELECTRODE REM PT RTRN 9FT ADLT (ELECTROSURGICAL) ×1 IMPLANT
GLOVE BIOGEL PI IND STRL 8 (GLOVE) ×1 IMPLANT
GLOVE BIOGEL PI INDICATOR 8 (GLOVE) ×1
GLOVE ECLIPSE 8.0 STRL XLNG CF (GLOVE) ×2 IMPLANT
GOWN STRL REUS W/ TWL LRG LVL3 (GOWN DISPOSABLE) ×2 IMPLANT
GOWN STRL REUS W/TWL LRG LVL3 (GOWN DISPOSABLE) ×4
HEMOSTAT SNOW SURGICEL 2X4 (HEMOSTASIS) IMPLANT
KIT MARKER MARGIN INK (KITS) ×2 IMPLANT
LIQUID BAND (GAUZE/BANDAGES/DRESSINGS) ×2 IMPLANT
NDL HYPO 25X1 1.5 SAFETY (NEEDLE) ×1 IMPLANT
NEEDLE HYPO 25X1 1.5 SAFETY (NEEDLE) ×2 IMPLANT
NS IRRIG 1000ML POUR BTL (IV SOLUTION) ×2 IMPLANT
PACK BASIN DAY SURGERY FS (CUSTOM PROCEDURE TRAY) ×2 IMPLANT
PENCIL BUTTON HOLSTER BLD 10FT (ELECTRODE) ×2 IMPLANT
SLEEVE SCD COMPRESS KNEE MED (MISCELLANEOUS) ×2 IMPLANT
SPONGE LAP 4X18 X RAY DECT (DISPOSABLE) ×2 IMPLANT
SUT MNCRL AB 4-0 PS2 18 (SUTURE) ×2 IMPLANT
SUT SILK 2 0 SH (SUTURE) IMPLANT
SUT VICRYL 3-0 CR8 SH (SUTURE) ×2 IMPLANT
SYR CONTROL 10ML LL (SYRINGE) ×2 IMPLANT
TOWEL OR 17X24 6PK STRL BLUE (TOWEL DISPOSABLE) ×2 IMPLANT
TOWEL OR NON WOVEN STRL DISP B (DISPOSABLE) ×2 IMPLANT
TUBE CONNECTING 20X1/4 (TUBING) IMPLANT
YANKAUER SUCT BULB TIP NO VENT (SUCTIONS) IMPLANT

## 2015-03-05 NOTE — Anesthesia Postprocedure Evaluation (Signed)
Anesthesia Post Note  Patient: Crystal Townsend  Procedure(s) Performed: Procedure(s) (LRB): RIGHT BREAST LUMPECTOMY WITH RADIOACTIVE SEED LOCALIZATION (Right)  Patient location during evaluation: PACU Anesthesia Type: General Level of consciousness: sedated Pain management: pain level controlled Vital Signs Assessment: post-procedure vital signs reviewed and stable Respiratory status: spontaneous breathing and respiratory function stable Cardiovascular status: stable Anesthetic complications: no    Last Vitals:  Filed Vitals:   03/05/15 0930 03/05/15 0945  BP: 123/70 123/69  Pulse: 76 90  Temp:    Resp: 15 17    Last Pain:  Filed Vitals:   03/05/15 0950  PainSc: 1                  Noni Stonesifer DANIEL

## 2015-03-05 NOTE — Interval H&P Note (Signed)
History and Physical Interval Note:  03/05/2015 7:29 AM  Crystal Townsend  has presented today for surgery, with the diagnosis of Right Breast Papilloma  The various methods of treatment have been discussed with the patient and family. After consideration of risks, benefits and other options for treatment, the patient has consented to  Procedure(s): RIGHT BREAST LUMPECTOMY WITH RADIOACTIVE SEED LOCALIZATION (Right) as a surgical intervention .  The patient's history has been reviewed, patient examined, no change in status, stable for surgery.  I have reviewed the patient's chart and labs.  Questions were answered to the patient's satisfaction.     Ferry Matthis A.

## 2015-03-05 NOTE — Discharge Instructions (Signed)
[facility] °Office Phone Number [phone] ° °BREAST BIOPSY/ PARTIAL MASTECTOMY: POST OP INSTRUCTIONS ° °Always review your discharge instruction sheet given to you by the facility where your surgery was performed. ° °IF YOU HAVE DISABILITY OR FAMILY LEAVE FORMS, YOU MUST BRING THEM TO THE OFFICE FOR PROCESSING.  DO NOT GIVE THEM TO YOUR DOCTOR. ° °1. A prescription for pain medication may be given to you upon discharge.  Take your pain medication as prescribed, if needed.  If narcotic pain medicine is not needed, then you may take acetaminophen (Tylenol) or ibuprofen (Advil) as needed. °2. Take your usually prescribed medications unless otherwise directed °3. If you need a refill on your pain medication, please contact your pharmacy.  They will contact our office to request authorization.  Prescriptions will not be filled after 5pm or on week-ends. °4. You should eat very light the first 24 hours after surgery, such as soup, crackers, pudding, etc.  Resume your normal diet the day after surgery. °5. Most patients will experience some swelling and bruising in the breast.  Ice packs and a good support bra will help.  Swelling and bruising can take several days to resolve.  °6. It is common to experience some constipation if taking pain medication after surgery.  Increasing fluid intake and taking a stool softener will usually help or prevent this problem from occurring.  A mild laxative (Milk of Magnesia or Miralax) should be taken according to package directions if there are no bowel movements after 48 hours. °7. Unless discharge instructions indicate otherwise, you may remove your bandages 24-48 hours after surgery, and you may shower at that time.  You may have steri-strips (small skin tapes) in place directly over the incision.  These strips should be left on the skin for 7-10 days.  If your surgeon used skin glue on the incision, you may shower in 24 hours.  The glue will flake off over the  next 2-3 weeks.  Any sutures or staples will be removed at the office during your follow-up visit. °8. ACTIVITIES:  You may resume regular daily activities (gradually increasing) beginning the next day.  Wearing a good support bra or sports bra minimizes pain and swelling.  You may have sexual intercourse when it is comfortable. °a. You may drive when you no longer are taking prescription pain medication, you can comfortably wear a seatbelt, and you can safely maneuver your car and apply brakes. °b. RETURN TO WORK:  ______________________________________________________________________________________ °9. You should see your doctor in the office for a follow-up appointment approximately two weeks after your surgery.  Your doctor’s nurse will typically make your follow-up appointment when she calls you with your pathology report.  Expect your pathology report 2-3 business days after your surgery.  You may call to check if you do not hear from us after three days. °10. OTHER INSTRUCTIONS: _______________________________________________________________________________________________ _____________________________________________________________________________________________________________________________________ °_____________________________________________________________________________________________________________________________________ °_____________________________________________________________________________________________________________________________________ ° °WHEN TO CALL YOUR DOCTOR: °1. Fever over 101.0 °2. Nausea and/or vomiting. °3. Extreme swelling or bruising. °4. Continued bleeding from incision. °5. Increased pain, redness, or drainage from the incision. ° °The clinic staff is available to answer your questions during regular business hours.  Please don’t hesitate to call and ask to speak to one of the nurses for clinical concerns.  If you have a medical emergency, go to the nearest  emergency room or call 911.  A surgeon from [facility] is always on call at the hospital. ° °For further questions, please visit [email or web address]  ° ° ° °  Post Anesthesia Home Care Instructions ° °Activity: °Get plenty of rest for the remainder of the day. A responsible adult should stay with you for 24 hours following the procedure.  °For the next 24 hours, DO NOT: °-Drive a car °-Operate machinery °-Drink alcoholic beverages °-Take any medication unless instructed by your physician °-Make any legal decisions or sign important papers. ° °Meals: °Start with liquid foods such as gelatin or soup. Progress to regular foods as tolerated. Avoid greasy, spicy, heavy foods. If nausea and/or vomiting occur, drink only clear liquids until the nausea and/or vomiting subsides. Call your physician if vomiting continues. ° °Special Instructions/Symptoms: °Your throat may feel dry or sore from the anesthesia or the breathing tube placed in your throat during surgery. If this causes discomfort, gargle with warm salt water. The discomfort should disappear within 24 hours. ° °If you had a scopolamine patch placed behind your ear for the management of post- operative nausea and/or vomiting: ° °1. The medication in the patch is effective for 72 hours, after which it should be removed.  Wrap patch in a tissue and discard in the trash. Wash hands thoroughly with soap and water. °2. You may remove the patch earlier than 72 hours if you experience unpleasant side effects which may include dry mouth, dizziness or visual disturbances. °3. Avoid touching the patch. Wash your hands with soap and water after contact with the patch. °  ° °

## 2015-03-05 NOTE — Op Note (Signed)
Preoperative diagnosis: Right breast papilloma  Postoperative diagnosis: Same  Procedure: Right breast seed localized lumpectomy  Surgeon: Erroll Luna M.D.  Anesthesia: LMA with 0.25% Sensorcaine local with epinephrine  EBL: Minimal  Specimen: Right breast tissue with 2 clips from previous core biopsy and radioactive seed which was sent separately  Drains: None  Indications for procedure: The patient is a 49 year old female found to have a mammographic abnormality that was core biopsy improvement to be intraductal papilloma. We discussed the rationale for removing this which is a low but significant risk of underlying malignancy. We discussed the options of observation as well. The pros and cons of surgery as well as complications and long term expectations were discussed.The procedure has been discussed with the patient. Alternatives to surgery have been discussed with the patient.  Risks of surgery include bleeding,  Infection,  Seroma formation, death,  and the need for further surgery.   The patient understands and wishes to proceed.  Description of procedure: The patient was met in the holding area and questions are answered. Neoprobe used to verify proper seed location. Questions are answered. Patient taken back to operating room and placed upon the OR table. After LMA anesthesia was initiated, the right breast was prepped and draped in a sterile fashion. Timeout was done to verify proper procedure, site and patient. Neoprobe was used to see was identified posterior to the nipple areolar complex. Curvilinear incision was made along the base of the nipple areolar complex with the assistance the neoprobe dissection was carried down around the breast tissue of interest. The seed was removed separately from the specimen and placed in a separate container and sent to pathology with the specimen once it was removed. Radiograph was taken to the specimen in both biopsy clip within the tissue.  Hemostasis achieved. Gross margins were negative. Wound closed with 3-0 Vicryl and 4-0 Monocryl after assuring hemostasis. Liquid adhesive applied. All final counts found to be correct. Patient awoke extubated taken to recovery in satisfactory condition.

## 2015-03-05 NOTE — H&P (Signed)
Crystal Townsend is an 49 y.o. female.   Chief Complaint: right breast papilloma HPI: pt presents for right breast lumpectomy for papilloma  Past Medical History  Diagnosis Date  . Fatigue   . Snoring     had a sleep study mild osa 2009  . Other dysfunctions of sleep stages or arousal from sleep   . Chest pain   . Impaired fasting glucose   . Hypertension   . Knee pain, bilateral   . Allergic rhinitis   . GERD (gastroesophageal reflux disease)   . Anxiety   . Leukoplakia oral mucosa     2011  . IBS (irritable bowel syndrome)   . Menorrhagia   . Fibroids   . Dysmenorrhea   . Diabetes mellitus   . OSA (obstructive sleep apnea)     does not use cpap, never went to get machine  . Depression     Past Surgical History  Procedure Laterality Date  . Cesarean section  1997  . Ectopic pregnancy surgery  2000  . Oral biopsy    . Dilation and curettage of uterus      Family History  Problem Relation Age of Onset  . Lung cancer Mother   . Cancer Mother     lung   . Prostate cancer Father   . Cancer Father     prostate  . Diabetes Father   . Hyperlipidemia Father   . Hypertension Father   . Asthma Father   . Cancer Maternal Aunt     breast  . Cancer Maternal Aunt     breast  . Cancer Maternal Aunt     breast   Social History:  reports that she has never smoked. She has never used smokeless tobacco. She reports that she drinks alcohol. She reports that she does not use illicit drugs.  Allergies:  Allergies  Allergen Reactions  . Lisinopril     REACTION: throat epiglottal? swelling ? from ace  . Dr Constance Holster evaluation.  10.09  . Metformin     REACTION: nausea with 500 mg  Immediate release    Medications Prior to Admission  Medication Sig Dispense Refill  . aspirin 81 MG EC tablet Take 1 tablet (81 mg total) by mouth daily. Swallow whole. 100 tablet 2  . Biotin 1000 MCG tablet Take 1,000 mcg by mouth 3 (three) times daily.    Marland Kitchen buPROPion (WELLBUTRIN XL) 300 MG 24 hr  tablet Take 1 tablet (300 mg total) by mouth daily. 30 tablet 5  . cetirizine (ZYRTEC) 10 MG tablet TAKE 1 TABLET BY MOUTH DAILY 30 tablet 11  . fluticasone (FLONASE) 50 MCG/ACT nasal spray 2 spray each nostril qd 16 g 11  . levonorgestrel (MIRENA) 20 MCG/24HR IUD 1 each by Intrauterine route once.      . Multiple Vitamins-Minerals (HAIR/SKIN/NAILS PO) Take by mouth.      . ONGLYZA 5 MG TABS tablet Take 1 tablet by mouth  daily 90 tablet 1  . telmisartan-hydrochlorothiazide (MICARDIS HCT) 80-12.5 MG per tablet Take 1 tablet by mouth  daily 90 tablet 1  . traMADol (ULTRAM) 50 MG tablet Take 1 tablet (50 mg total) by mouth every 8 (eight) hours as needed. 30 tablet 0  . zolpidem (AMBIEN) 5 MG tablet Take 1 at night as needed for sleep limit to 3 x per week. 20 tablet 0  . acetaminophen (TYLENOL) 325 MG tablet Take 2 tablets (650 mg total) by mouth every 8 (eight) hours as needed  for pain (headache). 100 tablet 2  . cyclobenzaprine (FLEXERIL) 10 MG tablet Take 1 tablet (10 mg total) by mouth 3 (three) times daily as needed for muscle spasms. 30 tablet 0  . diazepam (VALIUM) 10 MG tablet     . naproxen sodium (ANAPROX DS) 550 MG tablet Take 1 tablet (550 mg total) by mouth 2 (two) times daily with a meal. 40 tablet 0    Results for orders placed or performed during the hospital encounter of 03/05/15 (from the past 48 hour(s))  Glucose, capillary     Status: Abnormal   Collection Time: 03/05/15  6:31 AM  Result Value Ref Range   Glucose-Capillary 127 (H) 65 - 99 mg/dL   No results found.  Review of Systems  Constitutional: Negative.   Respiratory: Negative.   Cardiovascular: Negative.   Skin: Negative.     Blood pressure 134/78, pulse 76, temperature 98.2 F (36.8 C), temperature source Oral, resp. rate 18, height 5' 5.5" (1.664 m), weight 105.405 kg (232 lb 6 oz), SpO2 100 %. Physical Exam  Constitutional: She appears well-developed and well-nourished.  Cardiovascular: Normal rate.    Respiratory: Effort normal. She exhibits no mass and no tenderness. Right breast exhibits no inverted nipple, no mass, no nipple discharge and no skin change. Left breast exhibits no inverted nipple, no mass, no nipple discharge and no skin change.  GI: Soft.  Skin: Skin is warm.  Psychiatric: She has a normal mood and affect. Her behavior is normal.     Assessment/Plan Right breast papilloma Right breast seed localized lumpectomy The procedure has been discussed with the patient. Alternatives to surgery have been discussed with the patient.  Risks of surgery include bleeding,  Infection,  Seroma formation, death,  and the need for further surgery.   The patient understands and wishes to proceed.  Crystal Townsend A. 03/05/2015, 7:28 AM

## 2015-03-05 NOTE — Anesthesia Procedure Notes (Signed)
Procedure Name: LMA Insertion Date/Time: 03/05/2015 7:43 AM Performed by: Denna Haggard D Pre-anesthesia Checklist: Patient identified, Emergency Drugs available, Suction available and Patient being monitored Patient Re-evaluated:Patient Re-evaluated prior to inductionOxygen Delivery Method: Circle System Utilized Preoxygenation: Pre-oxygenation with 100% oxygen Intubation Type: IV induction Ventilation: Mask ventilation without difficulty LMA: LMA inserted LMA Size: 4.0 Number of attempts: 1 Airway Equipment and Method: Bite block Placement Confirmation: positive ETCO2 Tube secured with: Tape Dental Injury: Teeth and Oropharynx as per pre-operative assessment

## 2015-03-05 NOTE — Transfer of Care (Signed)
Immediate Anesthesia Transfer of Care Note  Patient: Crystal Townsend  Procedure(s) Performed: Procedure(s) (LRB): RIGHT BREAST LUMPECTOMY WITH RADIOACTIVE SEED LOCALIZATION (Right)  Patient Location: PACU  Anesthesia Type: General  Level of Consciousness: awake, oriented, sedated and patient cooperative  Airway & Oxygen Therapy: Patient Spontanous Breathing and Patient connected to face mask oxygen  Post-op Assessment: Report given to PACU RN and Post -op Vital signs reviewed and stable  Post vital signs: Reviewed and stable  Complications: No apparent anesthesia complications

## 2015-03-06 ENCOUNTER — Encounter (HOSPITAL_BASED_OUTPATIENT_CLINIC_OR_DEPARTMENT_OTHER): Payer: Self-pay | Admitting: Surgery

## 2015-03-06 ENCOUNTER — Other Ambulatory Visit: Payer: 59

## 2015-03-06 ENCOUNTER — Encounter: Payer: 59 | Admitting: Internal Medicine

## 2015-03-12 ENCOUNTER — Other Ambulatory Visit: Payer: Self-pay | Admitting: Internal Medicine

## 2015-03-13 NOTE — Telephone Encounter (Signed)
Pt past due for cpx.  Do you want to work her in before the end of the year?

## 2015-03-13 NOTE — Telephone Encounter (Signed)
She just had a breast surgery  prob reason for delay  Ok to refill  2 months and  Do cpx with hg a1c in January unless she wishes otherwise

## 2015-03-17 ENCOUNTER — Telehealth: Payer: Self-pay | Admitting: Family Medicine

## 2015-03-17 ENCOUNTER — Other Ambulatory Visit: Payer: Self-pay | Admitting: Family Medicine

## 2015-03-17 DIAGNOSIS — R7303 Prediabetes: Secondary | ICD-10-CM

## 2015-03-17 DIAGNOSIS — Z0000 Encounter for general adult medical examination without abnormal findings: Secondary | ICD-10-CM

## 2015-03-17 DIAGNOSIS — R7309 Other abnormal glucose: Secondary | ICD-10-CM

## 2015-03-17 NOTE — Telephone Encounter (Signed)
Pt needs cpx and fasting lab work in Jan 17.  I have placed the lab orders.  Please help the pt to make both appointments.  Thanks!

## 2015-03-17 NOTE — Telephone Encounter (Signed)
Sent to the pharmacy by e-scribe.  Message sent to scheduling. 

## 2015-03-17 NOTE — Telephone Encounter (Signed)
Pt will call back to sch.

## 2015-04-04 ENCOUNTER — Other Ambulatory Visit: Payer: Self-pay | Admitting: Internal Medicine

## 2015-04-05 IMAGING — US US BREAST LTD UNI RIGHT INC AXILLA
1 series · 7 of 7 positions shown · non-contrast
Comparison: Priors including ultrasound 09/09/2013

CLINICAL DATA: Patient for short-term follow-up probably benign
right breast mass.

EXAM:
ULTRASOUND OF THE RIGHT BREAST

[Series 1: advbreast · 7 of 7 slices shown]
[im 1/7]
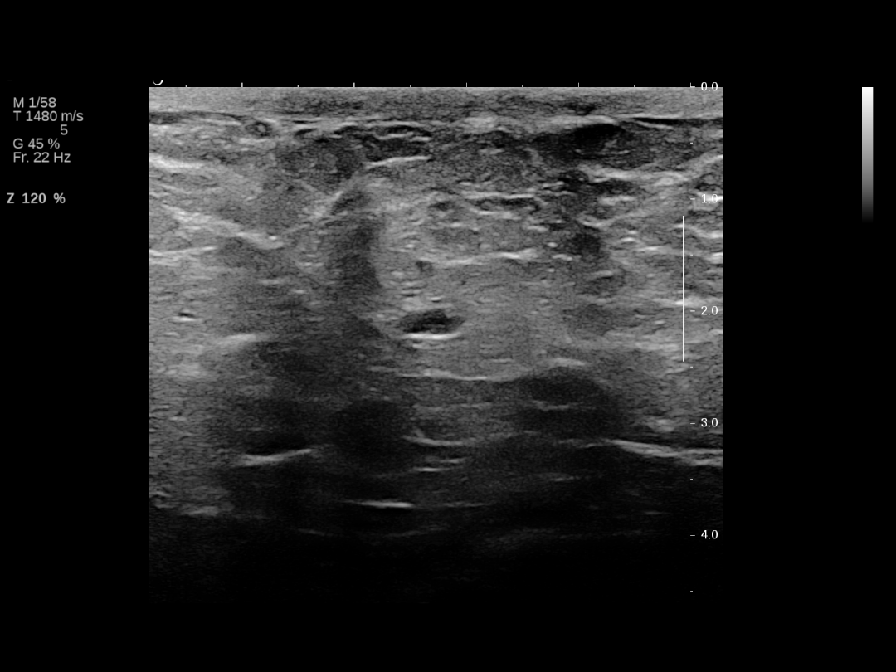
[im 2/7]
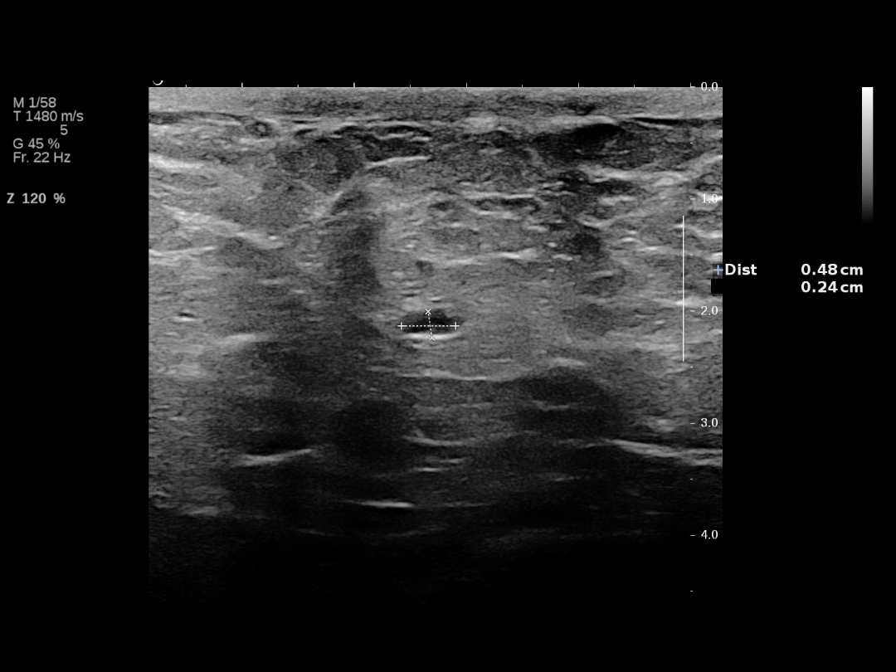
[im 3/7]
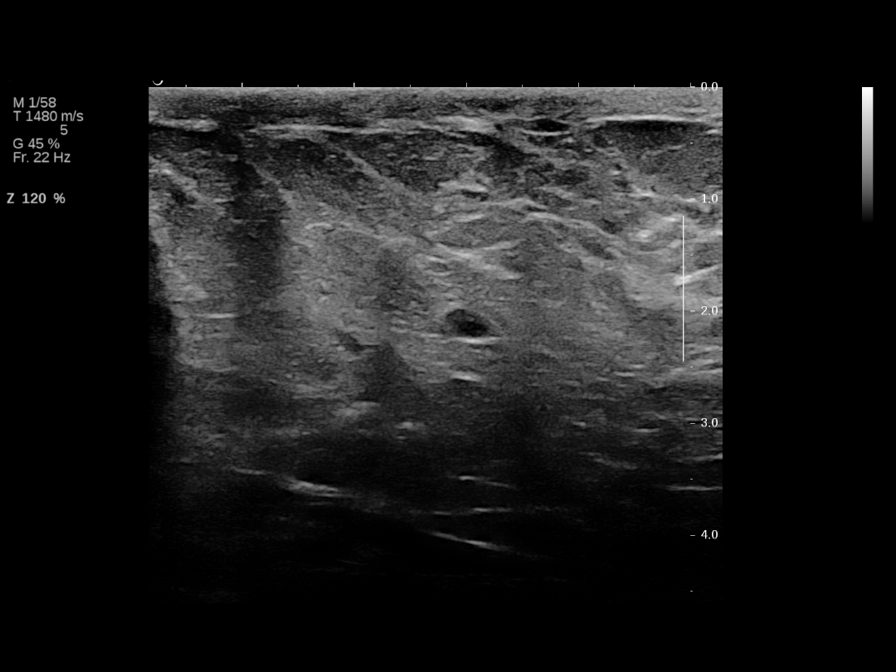
[im 4/7]
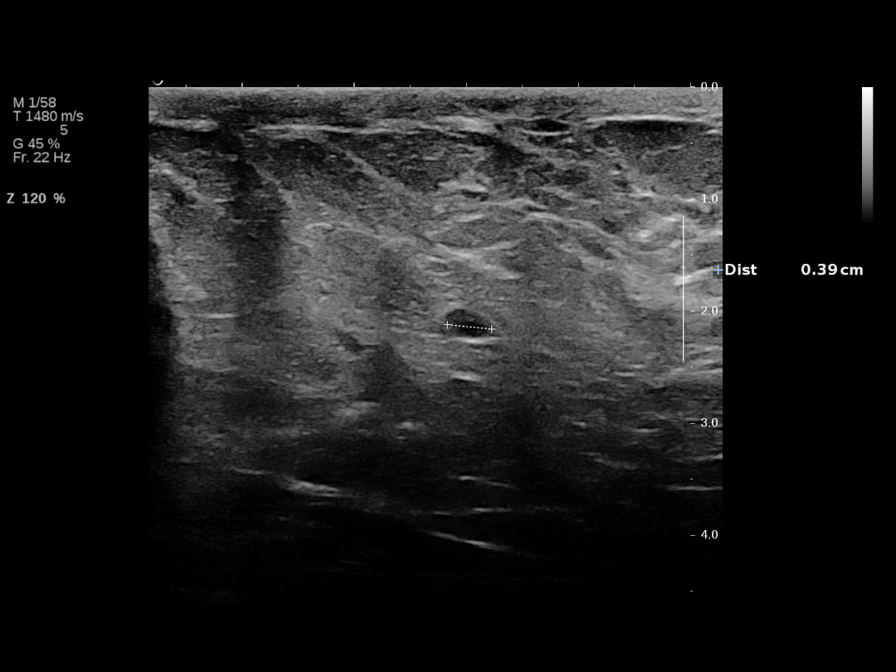
[im 5/7]
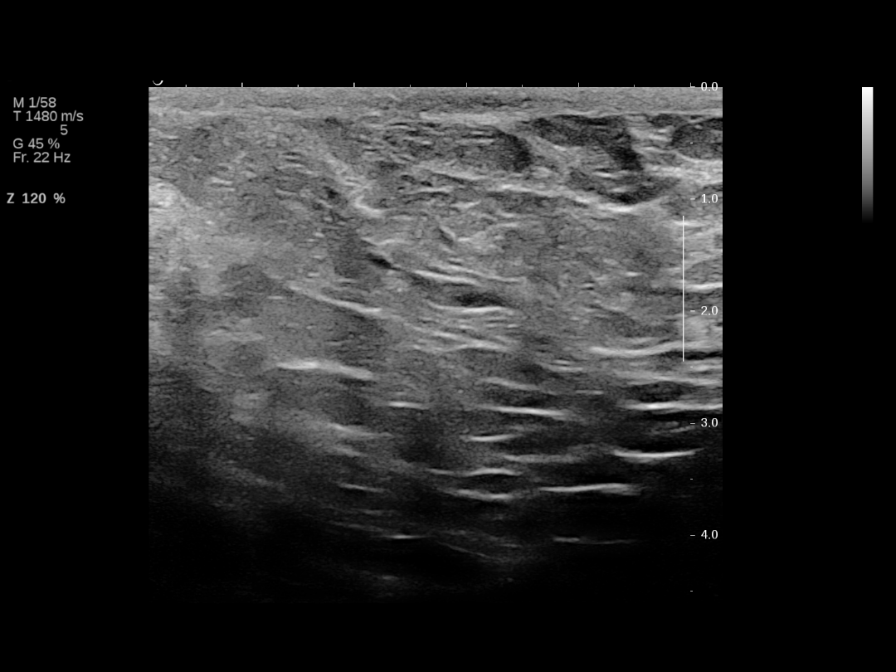
[im 6/7]
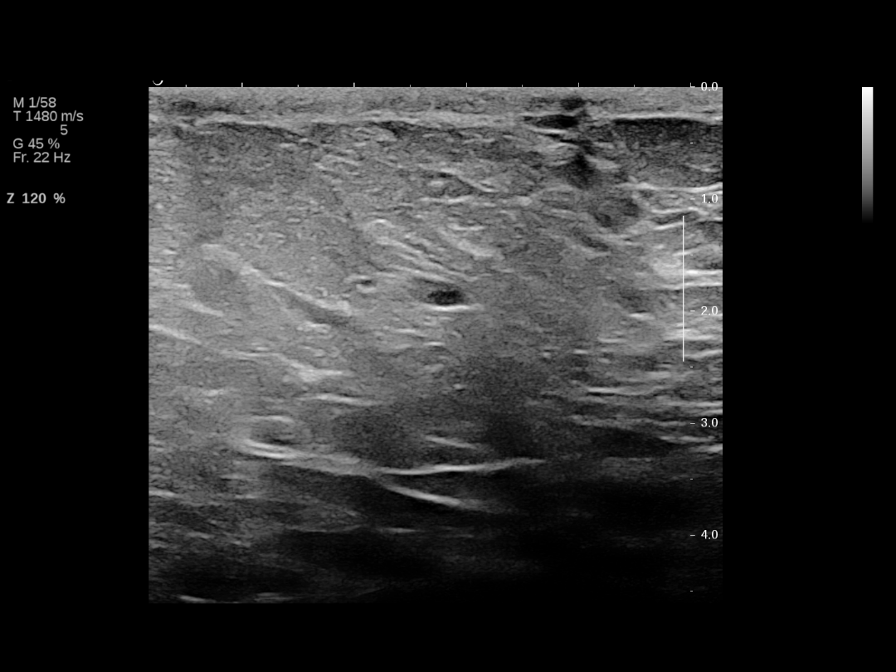
[im 7/7]
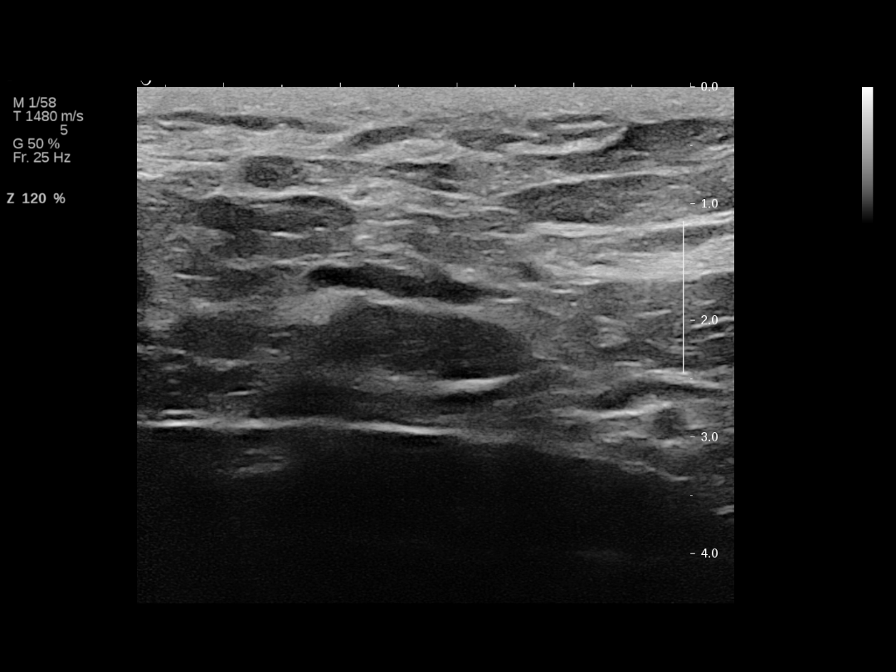

[7 of 7 positions shown; findings below may reference images not displayed]

FINDINGS: On physical exam, I palpate no discrete mass within the 6 o'clock
position right breast.

Ultrasound is performed, showing a 5 x 4 x 3 mm oval circumscribed
hypoechoic structure within the right breast 6 o'clock position 1 cm
from the nipple which appears to be part of a duct and contains
either internal debris or in intraductal mass.
IMPRESSION: Circumscribed hypoechoic mass with either intraductal debris or
potentially an intraductal mass within the right breast.

RECOMMENDATION:
Ultrasound-guided core needle biopsy right breast mass.

Biopsy scheduled for 03/24/2014 at 8 a.m..

I have discussed the findings and recommendations with the patient.
Results were also provided in writing at the conclusion of the
visit. If applicable, a reminder letter will be sent to the patient
regarding the next appointment.

BI-RADS CATEGORY  4: Suspicious.

## 2015-04-07 NOTE — Telephone Encounter (Signed)
Cancelled last two CPX appointments.  Please advise.

## 2015-04-08 ENCOUNTER — Telehealth: Payer: Self-pay | Admitting: Family Medicine

## 2015-04-08 NOTE — Telephone Encounter (Signed)
I see that she had breast surgery and that may have delayed her fu  Help her reschedule and labs can refill 30 days  In the interim to not run out of meds

## 2015-04-08 NOTE — Telephone Encounter (Signed)
Pt needs to be scheduled for cpx asap.  Lab orders are in the system.  Thanks!

## 2015-04-08 NOTE — Telephone Encounter (Signed)
Sent to the pharmacy for 30 days.  Message sent to scheduling.

## 2015-04-09 NOTE — Telephone Encounter (Signed)
lmom for pt to call back

## 2015-04-13 NOTE — Telephone Encounter (Signed)
Pt has been sch

## 2015-04-27 ENCOUNTER — Other Ambulatory Visit: Payer: Self-pay | Admitting: Internal Medicine

## 2015-05-20 ENCOUNTER — Other Ambulatory Visit: Payer: Self-pay | Admitting: Internal Medicine

## 2015-05-20 NOTE — Telephone Encounter (Signed)
Filled on 03/17/15 for three months.  Request is early.

## 2015-05-22 ENCOUNTER — Other Ambulatory Visit (INDEPENDENT_AMBULATORY_CARE_PROVIDER_SITE_OTHER): Payer: Commercial Managed Care - HMO

## 2015-05-22 DIAGNOSIS — R7303 Prediabetes: Secondary | ICD-10-CM | POA: Diagnosis not present

## 2015-05-22 DIAGNOSIS — R7309 Other abnormal glucose: Secondary | ICD-10-CM

## 2015-05-22 DIAGNOSIS — Z0000 Encounter for general adult medical examination without abnormal findings: Secondary | ICD-10-CM | POA: Diagnosis not present

## 2015-05-22 LAB — TSH: TSH: 1.03 u[IU]/mL (ref 0.35–4.50)

## 2015-05-22 LAB — LIPID PANEL
CHOL/HDL RATIO: 5
Cholesterol: 217 mg/dL — ABNORMAL HIGH (ref 0–200)
HDL: 42.2 mg/dL (ref >39.00)
LDL CALC: 155 mg/dL — AB (ref 0–99)
NONHDL: 175.23
Triglycerides: 100 mg/dL (ref 0.0–149.0)
VLDL: 20 mg/dL (ref 0.0–40.0)

## 2015-05-22 LAB — BASIC METABOLIC PANEL
BUN: 12 mg/dL (ref 6–23)
CHLORIDE: 104 meq/L (ref 96–112)
CO2: 30 mEq/L (ref 19–32)
Calcium: 9.9 mg/dL (ref 8.4–10.5)
Creatinine, Ser: 1 mg/dL (ref 0.40–1.20)
GFR: 74.97 mL/min (ref >60.00)
Glucose, Bld: 124 mg/dL — ABNORMAL HIGH (ref 70–99)
POTASSIUM: 4 meq/L (ref 3.5–5.1)
SODIUM: 143 meq/L (ref 135–145)

## 2015-05-22 LAB — CBC WITH DIFFERENTIAL/PLATELET
BASOS PCT: 0.4 % (ref 0.0–3.0)
Basophils Absolute: 0 10*3/uL (ref 0.0–0.1)
EOS PCT: 2.7 % (ref 0.0–5.0)
Eosinophils Absolute: 0.2 10*3/uL (ref 0.0–0.7)
HEMATOCRIT: 38.9 % (ref 36.0–46.0)
HEMOGLOBIN: 12.8 g/dL (ref 12.0–15.0)
LYMPHS PCT: 34.8 % (ref 12.0–46.0)
Lymphs Abs: 2.3 10*3/uL (ref 0.7–4.0)
MCHC: 32.8 g/dL (ref 30.0–36.0)
MCV: 86.2 fl (ref 78.0–100.0)
MONO ABS: 0.4 10*3/uL (ref 0.1–1.0)
MONOS PCT: 6.5 % (ref 3.0–12.0)
Neutro Abs: 3.7 10*3/uL (ref 1.4–7.7)
Neutrophils Relative %: 55.6 % (ref 43.0–77.0)
Platelets: 448 10*3/uL — ABNORMAL HIGH (ref 150.0–400.0)
RBC: 4.52 Mil/uL (ref 3.87–5.11)
RDW: 14.7 % (ref 11.5–15.5)
WBC: 6.6 10*3/uL (ref 4.0–10.5)

## 2015-05-22 LAB — HEPATIC FUNCTION PANEL
ALBUMIN: 4.4 g/dL (ref 3.5–5.2)
ALK PHOS: 72 U/L (ref 39–117)
ALT: 12 U/L (ref 0–35)
AST: 15 U/L (ref 0–37)
Bilirubin, Direct: 0.1 mg/dL (ref 0.0–0.3)
Total Bilirubin: 0.5 mg/dL (ref 0.2–1.2)
Total Protein: 8.1 g/dL (ref 6.0–8.3)

## 2015-05-22 LAB — HEMOGLOBIN A1C: Hgb A1c MFr Bld: 6.9 % — ABNORMAL HIGH (ref 4.6–6.5)

## 2015-06-03 ENCOUNTER — Ambulatory Visit (INDEPENDENT_AMBULATORY_CARE_PROVIDER_SITE_OTHER): Payer: Commercial Managed Care - HMO | Admitting: Internal Medicine

## 2015-06-03 ENCOUNTER — Encounter: Payer: Self-pay | Admitting: Internal Medicine

## 2015-06-03 VITALS — BP 134/86 | Temp 98.6°F | Ht 66.0 in | Wt 236.9 lb

## 2015-06-03 DIAGNOSIS — E785 Hyperlipidemia, unspecified: Secondary | ICD-10-CM

## 2015-06-03 DIAGNOSIS — Z0000 Encounter for general adult medical examination without abnormal findings: Secondary | ICD-10-CM | POA: Diagnosis not present

## 2015-06-03 DIAGNOSIS — Z23 Encounter for immunization: Secondary | ICD-10-CM

## 2015-06-03 DIAGNOSIS — Z1211 Encounter for screening for malignant neoplasm of colon: Secondary | ICD-10-CM

## 2015-06-03 DIAGNOSIS — D473 Essential (hemorrhagic) thrombocythemia: Secondary | ICD-10-CM

## 2015-06-03 DIAGNOSIS — R7989 Other specified abnormal findings of blood chemistry: Secondary | ICD-10-CM | POA: Insufficient documentation

## 2015-06-03 DIAGNOSIS — J309 Allergic rhinitis, unspecified: Secondary | ICD-10-CM | POA: Diagnosis not present

## 2015-06-03 DIAGNOSIS — E119 Type 2 diabetes mellitus without complications: Secondary | ICD-10-CM | POA: Diagnosis not present

## 2015-06-03 DIAGNOSIS — I10 Essential (primary) hypertension: Secondary | ICD-10-CM | POA: Diagnosis not present

## 2015-06-03 DIAGNOSIS — Z79899 Other long term (current) drug therapy: Secondary | ICD-10-CM

## 2015-06-03 MED ORDER — ASPIRIN 81 MG PO TBEC: 81.0000 mg | DELAYED_RELEASE_TABLET | Freq: Every day | ORAL | Status: DC

## 2015-06-03 MED ORDER — CETIRIZINE HCL 10 MG PO TABS: ORAL_TABLET | ORAL | Status: DC

## 2015-06-03 MED ORDER — BUPROPION HCL ER (XL) 300 MG PO TB24: 300.0000 mg | ORAL_TABLET | Freq: Every day | ORAL | Status: DC

## 2015-06-03 MED ORDER — FLUTICASONE PROPIONATE 50 MCG/ACT NA SUSP: NASAL | Status: DC

## 2015-06-03 NOTE — Patient Instructions (Addendum)
Go back on the Wellbutrin  Recheck labs  in 3-4 months and then decide follow up . Will refer for colonoscopy  Routine.   Health Maintenance, Female Adopting a healthy lifestyle and getting preventive care can go a long way to promote health and wellness. Talk with your health care provider about what schedule of regular examinations is right for you. This is a good chance for you to check in with your provider about disease prevention and staying healthy. In between checkups, there are plenty of things you can do on your own. Experts have done a lot of research about which lifestyle changes and preventive measures are most likely to keep you healthy. Ask your health care provider for more information. WEIGHT AND DIET  Eat a healthy diet  Be sure to include plenty of vegetables, fruits, low-fat dairy products, and lean protein.  Do not eat a lot of foods high in solid fats, added sugars, or salt.  Get regular exercise. This is one of the most important things you can do for your health.  Most adults should exercise for at least 150 minutes each week. The exercise should increase your heart rate and make you sweat (moderate-intensity exercise).  Most adults should also do strengthening exercises at least twice a week. This is in addition to the moderate-intensity exercise.  Maintain a healthy weight  Body mass index (BMI) is a measurement that can be used to identify possible weight problems. It estimates body fat based on height and weight. Your health care provider can help determine your BMI and help you achieve or maintain a healthy weight.  For females 4 years of age and older:   A BMI below 18.5 is considered underweight.  A BMI of 18.5 to 24.9 is normal.  A BMI of 25 to 29.9 is considered overweight.  A BMI of 30 and above is considered obese.  Watch levels of cholesterol and blood lipids  You should start having your blood tested for lipids and cholesterol at 52 years of  age, then have this test every 5 years.  You may need to have your cholesterol levels checked more often if:  Your lipid or cholesterol levels are high.  You are older than 52 years of age.  You are at high risk for heart disease.  CANCER SCREENING   Lung Cancer  Lung cancer screening is recommended for adults 21-64 years old who are at high risk for lung cancer because of a history of smoking.  A yearly low-dose CT scan of the lungs is recommended for people who:  Currently smoke.  Have quit within the past 15 years.  Have at least a 30-pack-year history of smoking. A pack year is smoking an average of one pack of cigarettes a day for 1 year.  Yearly screening should continue until it has been 15 years since you quit.  Yearly screening should stop if you develop a health problem that would prevent you from having lung cancer treatment.  Breast Cancer  Practice breast self-awareness. This means understanding how your breasts normally appear and feel.  It also means doing regular breast self-exams. Let your health care provider know about any changes, no matter how small.  If you are in your 20s or 30s, you should have a clinical breast exam (CBE) by a health care provider every 1-3 years as part of a regular health exam.  If you are 78 or older, have a CBE every year. Also consider having a breast  X-ray (mammogram) every year.  If you have a family history of breast cancer, talk to your health care provider about genetic screening.  If you are at high risk for breast cancer, talk to your health care provider about having an MRI and a mammogram every year.  Breast cancer gene (BRCA) assessment is recommended for women who have family members with BRCA-related cancers. BRCA-related cancers include:  Breast.  Ovarian.  Tubal.  Peritoneal cancers.  Results of the assessment will determine the need for genetic counseling and BRCA1 and BRCA2 testing. Cervical  Cancer Your health care provider may recommend that you be screened regularly for cancer of the pelvic organs (ovaries, uterus, and vagina). This screening involves a pelvic examination, including checking for microscopic changes to the surface of your cervix (Pap test). You may be encouraged to have this screening done every 3 years, beginning at age 59.  For women ages 49-65, health care providers may recommend pelvic exams and Pap testing every 3 years, or they may recommend the Pap and pelvic exam, combined with testing for human papilloma virus (HPV), every 5 years. Some types of HPV increase your risk of cervical cancer. Testing for HPV may also be done on women of any age with unclear Pap test results.  Other health care providers may not recommend any screening for nonpregnant women who are considered low risk for pelvic cancer and who do not have symptoms. Ask your health care provider if a screening pelvic exam is right for you.  If you have had past treatment for cervical cancer or a condition that could lead to cancer, you need Pap tests and screening for cancer for at least 20 years after your treatment. If Pap tests have been discontinued, your risk factors (such as having a new sexual partner) need to be reassessed to determine if screening should resume. Some women have medical problems that increase the chance of getting cervical cancer. In these cases, your health care provider may recommend more frequent screening and Pap tests. Colorectal Cancer  This type of cancer can be detected and often prevented.  Routine colorectal cancer screening usually begins at 52 years of age and continues through 52 years of age.  Your health care provider may recommend screening at an earlier age if you have risk factors for colon cancer.  Your health care provider may also recommend using home test kits to check for hidden blood in the stool.  A small camera at the end of a tube can be used to  examine your colon directly (sigmoidoscopy or colonoscopy). This is done to check for the earliest forms of colorectal cancer.  Routine screening usually begins at age 12.  Direct examination of the colon should be repeated every 5-10 years through 52 years of age. However, you may need to be screened more often if early forms of precancerous polyps or small growths are found. Skin Cancer  Check your skin from head to toe regularly.  Tell your health care provider about any new moles or changes in moles, especially if there is a change in a mole's shape or color.  Also tell your health care provider if you have a mole that is larger than the size of a pencil eraser.  Always use sunscreen. Apply sunscreen liberally and repeatedly throughout the day.  Protect yourself by wearing long sleeves, pants, a wide-brimmed hat, and sunglasses whenever you are outside. HEART DISEASE, DIABETES, AND HIGH BLOOD PRESSURE   High blood pressure causes heart  disease and increases the risk of stroke. High blood pressure is more likely to develop in:  People who have blood pressure in the high end of the normal range (130-139/85-89 mm Hg).  People who are overweight or obese.  People who are African American.  If you are 46-25 years of age, have your blood pressure checked every 3-5 years. If you are 69 years of age or older, have your blood pressure checked every year. You should have your blood pressure measured twice--once when you are at a hospital or clinic, and once when you are not at a hospital or clinic. Record the average of the two measurements. To check your blood pressure when you are not at a hospital or clinic, you can use:  An automated blood pressure machine at a pharmacy.  A home blood pressure monitor.  If you are between 82 years and 75 years old, ask your health care provider if you should take aspirin to prevent strokes.  Have regular diabetes screenings. This involves taking a  blood sample to check your fasting blood sugar level.  If you are at a normal weight and have a low risk for diabetes, have this test once every three years after 52 years of age.  If you are overweight and have a high risk for diabetes, consider being tested at a younger age or more often. PREVENTING INFECTION  Hepatitis B  If you have a higher risk for hepatitis B, you should be screened for this virus. You are considered at high risk for hepatitis B if:  You were born in a country where hepatitis B is common. Ask your health care provider which countries are considered high risk.  Your parents were born in a high-risk country, and you have not been immunized against hepatitis B (hepatitis B vaccine).  You have HIV or AIDS.  You use needles to inject street drugs.  You live with someone who has hepatitis B.  You have had sex with someone who has hepatitis B.  You get hemodialysis treatment.  You take certain medicines for conditions, including cancer, organ transplantation, and autoimmune conditions. Hepatitis C  Blood testing is recommended for:  Everyone born from [birth year] through 1967.  Anyone with known risk factors for hepatitis C. Sexually transmitted infections (STIs)  You should be screened for sexually transmitted infections (STIs) including gonorrhea and chlamydia if:  You are sexually active and are younger than 52 years of age.  You are older than 52 years of age and your health care provider tells you that you are at risk for this type of infection.  Your sexual activity has changed since you were last screened and you are at an increased risk for chlamydia or gonorrhea. Ask your health care provider if you are at risk.  If you do not have HIV, but are at risk, it may be recommended that you take a prescription medicine daily to prevent HIV infection. This is called pre-exposure prophylaxis (PrEP). You are considered at risk if:  You are sexually active and do  not regularly use condoms or know the HIV status of your partner(s).  You take drugs by injection.  You are sexually active with a partner who has HIV. Talk with your health care provider about whether you are at high risk of being infected with HIV. If you choose to begin PrEP, you should first be tested for HIV. You should then be tested every 3 months for as long as you are taking  PrEP.  PREGNANCY   If you are premenopausal and you may become pregnant, ask your health care provider about preconception counseling.  If you may become pregnant, take 400 to 800 micrograms (mcg) of folic acid every day.  If you want to prevent pregnancy, talk to your health care provider about birth control (contraception). OSTEOPOROSIS AND MENOPAUSE   Osteoporosis is a disease in which the bones lose minerals and strength with aging. This can result in serious bone fractures. Your risk for osteoporosis can be identified using a bone density scan.  If you are 12 years of age or older, or if you are at risk for osteoporosis and fractures, ask your health care provider if you should be screened.  Ask your health care provider whether you should take a calcium or vitamin D supplement to lower your risk for osteoporosis.  Menopause may have certain physical symptoms and risks.  Hormone replacement therapy may reduce some of these symptoms and risks. Talk to your health care provider about whether hormone replacement therapy is right for you.  HOME CARE INSTRUCTIONS   Schedule regular health, dental, and eye exams.  Stay current with your immunizations.   Do not use any tobacco products including cigarettes, chewing tobacco, or electronic cigarettes.  If you are pregnant, do not drink alcohol.  If you are breastfeeding, limit how much and how often you drink alcohol.  Limit alcohol intake to no more than 1 drink per day for nonpregnant women. One drink equals 12 ounces of beer, 5 ounces of wine, or 1  ounces of hard liquor.  Do not use street drugs.  Do not share needles.  Ask your health care provider for help if you need support or information about quitting drugs.  Tell your health care provider if you often feel depressed.  Tell your health care provider if you have ever been abused or do not feel safe at home.   This information is not intended to replace advice given to you by your health care provider. Make sure you discuss any questions you have with your health care provider.   Document Released: 10/04/2010 Document Revised: 04/11/2014 Document Reviewed: 02/20/2013 [employer] Interactive Patient Education [employer].

## 2015-06-03 NOTE — Progress Notes (Signed)
Pre visit review using our clinic review tool, if applicable. No additional management support is needed unless otherwise documented below in the visit note.  Chief Complaint  Patient presents with  . Annual Exam  . Hypertension  . Diabetes  . Hyperlipidemia    HPI: Patient  Crystal Townsend  50 y.o. comes in today for Preventive Health Care visit  And Chronic disease management.  DM  bg ok no low  HT take med incontrol  LIPIDS stopped atorva cause of right leg sx but didn't make adifference and back on for o nlu a few weeks   Allergy  Needs refill  Getting some sx   Ran out of wellbutrin 4-6 monts ago   Would like to go back on . Thinks it helped her   Health Maintenance  Topic Date Due  . Hepatitis C Screening  20-Mar-1966  . HIV Screening  09/15/1978  . TETANUS/TDAP  09/15/1982  . INFLUENZA VACCINE  11/03/2014  . FOOT EXAM  11/26/2014  . PAP SMEAR  11/28/2014  . COLONOSCOPY  08/06/2015  . HEMOGLOBIN A1C  11/19/2015  . OPHTHALMOLOGY EXAM  01/28/2016  . MAMMOGRAM  12/10/2016  . PNEUMOCOCCAL POLYSACCHARIDE VACCINE (2) 06/02/2020   Health Maintenance Review LIFESTYLE:  Exercise:  some Tobacco/ETS:n Alcohol: rare Sugar beverages:no Sleep: ok  hh of 2 no pets  40 hour week working Drug use: no    ROS:  GEN/ HEENT: No fever, significant weight changes sweats headaches vision problems hearing changes, CV/ PULM; No chest pain shortness of breath cough, syncope,edema  change in exercise tolerance. GI /GU: No adominal pain, vomiting, change in bowel habits. No blood in the stool. No significant GU symptoms. SKIN/HEME: ,no acute skin rashes suspicious lesions or bleeding. No lymphadenopathy, nodules, masses.  NEURO/ PSYCH:  No neurologic signs such as weakness numbness. No depression anxiety. IMM/ Allergy: No unusual infections.  Allergy .   REST of 12 system review negative except as per HPI   Past Medical History  Diagnosis Date  . Fatigue   . Snoring     had a  sleep study mild osa 2009  . Other dysfunctions of sleep stages or arousal from sleep   . Chest pain   . Impaired fasting glucose   . Hypertension   . Knee pain, bilateral   . Allergic rhinitis   . GERD (gastroesophageal reflux disease)   . Anxiety   . Leukoplakia oral mucosa     2011  . IBS (irritable bowel syndrome)   . Menorrhagia   . Fibroids   . Dysmenorrhea   . Diabetes mellitus   . OSA (obstructive sleep apnea)     does not use cpap, never went to get machine  . Depression     Past Surgical History  Procedure Laterality Date  . Cesarean section  1997  . Ectopic pregnancy surgery  2000  . Oral biopsy    . Dilation and curettage of uterus    . Breast lumpectomy with radioactive seed localization Right 03/05/2015    Procedure: RIGHT BREAST LUMPECTOMY WITH RADIOACTIVE SEED LOCALIZATION;  Surgeon: Erroll Luna, MD;  Location: [facility];  Service: General;  Laterality: Right;    Family History  Problem Relation Age of Onset  . Lung cancer Mother   . Cancer Mother     lung   . Prostate cancer Father   . Cancer Father     prostate  . Diabetes Father   . Hyperlipidemia Father   .  Hypertension Father   . Asthma Father   . Cancer Maternal Aunt     breast  . Cancer Maternal Aunt     breast  . Cancer Maternal Aunt     breast    Social History   Social History  . Marital Status: Married    Spouse Name: N/A  . Number of Children: N/A  . Years of Education: N/A   Social History Main Topics  . Smoking status: Never Smoker   . Smokeless tobacco: Never Used  . Alcohol Use: Yes     Comment: social  . Drug Use: No  . Sexual Activity: Not Currently    Birth Control/ Protection: IUD     Comment: mirena   Other Topics Concern  . 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 acetaminophen (TYLENOL) 325 MG tablet Take 2 tablets (650 mg total) by mouth every 8 (eight) hours as needed for pain (headache). 100 tablet 2  . atorvastatin (LIPITOR) 20 MG tablet Take 1 tablet by mouth  daily 30 tablet 0  . Biotin 1000 MCG tablet Take 1,000 mcg by mouth 3 (three) times daily.    Marland Kitchen levonorgestrel (MIRENA) 20 MCG/24HR IUD 1 each by Intrauterine route once.      . Multiple Vitamins-Minerals (HAIR/SKIN/NAILS PO) Take by mouth.      . naproxen sodium (ANAPROX DS) 550 MG tablet Take 1 tablet (550 mg total) by mouth 2 (two) times daily with a meal. 40 tablet 0  . ONGLYZA 5 MG TABS tablet Take 1 tablet by mouth  daily 90 tablet 0  . telmisartan-hydrochlorothiazide (MICARDIS HCT) 80-12.5 MG tablet Take 1 tablet by mouth  daily 90 tablet 0  . traMADol (ULTRAM) 50 MG tablet Take 1 tablet (50 mg total) by mouth every 8 (eight) hours as needed. 30 tablet 0  . aspirin 81 MG EC tablet Take 1 tablet (81 mg total) by mouth daily. Swallow whole. 100 tablet 2  . buPROPion (WELLBUTRIN XL) 300 MG 24 hr tablet Take 1 tablet (300 mg total) by mouth daily. 30 tablet 5  . cetirizine (ZYRTEC) 10 MG tablet TAKE 1 TABLET BY MOUTH DAILY 30 tablet 11  . fluticasone (FLONASE) 50 MCG/ACT nasal spray 2 spray each nostril qd 16 g 11  . cyclobenzaprine (FLEXERIL) 10 MG tablet Take 1 tablet (10 mg total) by mouth 3 (three) times daily as needed for muscle spasms. 30 tablet 0  . diazepam (VALIUM) 10 MG tablet     . oxyCODONE-acetaminophen (ROXICET) 5-325 MG tablet Take 1-2 tablets by mouth every 4 (four) hours as needed. 30 tablet 0  . zolpidem (AMBIEN) 5 MG tablet Take 1 at night as needed for sleep limit to 3 x per week. 20 tablet 0   No facility-administered medications prior to visit.     EXAM:  BP 134/86 mmHg  Temp(Src) 98.6 F (37 C) (Oral)  Ht '5\' 6"'$  (1.676 m)  Wt 236 lb 14.4 oz (107.457 kg)  BMI 38.25 kg/m2  Body mass index is 38.25  kg/(m^2).  Physical Exam: Vital signs reviewed [id number] is a well-developed well-nourished alert cooperative    who appearsr stated age in no acute distress.  HEENT: normocephalic atraumatic , Eyes: PERRL EOM's  full, conjunctiva clear, Nares: paten,t no deformity discharge or tenderness., Ears: no deformity EAC's clear TMs with normal landmarks. Mouth: clear OP, no lesions, edema.  Moist mucous membranes. Dentition in adequate repair. NECK: supple without masses, thyromegaly or bruits. CHEST/PULM:  Clear to auscultation and percussion breath sounds equal no wheeze , rales or rhonchi. No chest wall deformities or tenderness.  CV: PMI is nondisplaced, S1 S2 no gallops, murmurs, rubs. Peripheral pulses are full without delay.No JVD .  ABDOMEN: Bowel sounds normal nontender  No guard or rebound, no hepato splenomegal no CVA tenderness.  No hernia. Extremtities:  No clubbing cyanosis or edema, no acute joint swelling or redness no focal atrophy NEURO:  Oriented x3, cranial nerves 3-12 appear to be intact, no obvious focal weakness,gait within normal limits no abnormal reflexes or asymmetrical SKIN: No acute rashes normal turgor, color, no bruising or petechiae. PSYCH: Oriented, good eye contact, no obvious depression anxiety, cognition and judgment appear normal. LN: no cervical axillary inguinal adenopathy Diabetic Foot Exam - Simple   Simple Foot Form  Diabetic Foot exam was performed with the following findings:  Yes 06/03/2015  2:30 PM  Visual Inspection  No deformities, no ulcerations, no other skin breakdown bilaterally:  Yes  Sensation Testing  Intact to touch and monofilament testing bilaterally:  Yes  Pulse Check  Posterior Tibialis and Dorsalis pulse intact bilaterally:  Yes  Comments      Lab Results  Component Value Date   WBC 6.6 05/22/2015   HGB 12.8 05/22/2015   HCT 38.9 05/22/2015   PLT 448.0* 05/22/2015   GLUCOSE 124* 05/22/2015   CHOL 217* 05/22/2015   TRIG 100.0  05/22/2015   HDL 42.20 05/22/2015   LDLDIRECT 162.5 03/07/2013   LDLCALC 155* 05/22/2015   ALT 12 05/22/2015   AST 15 05/22/2015   NA 143 05/22/2015   K 4.0 05/22/2015   CL 104 05/22/2015   CREATININE 1.00 05/22/2015   BUN 12 05/22/2015   CO2 30 05/22/2015   TSH 1.03 05/22/2015   HGBA1C 6.9* 05/22/2015   MICROALBUR 1.2 02/05/2009   BP Readings from Last 3 Encounters:  06/03/15 134/86  03/05/15 137/78  12/27/14 128/86   Wt Readings from Last 3 Encounters:  06/03/15 236 lb 14.4 oz (107.457 kg)  03/05/15 232 lb 6 oz (105.405 kg)  12/27/14 231 lb (104.781 kg)    ASSESSMENT AND PLAN:  Discussed the following assessment and plan:  Visit for preventive health examination  Colon cancer screening - Plan: Ambulatory referral to Gastroenterology  Diabetes mellitus without complication (HCC) - controlled   Allergic rhinitis, unspecified allergic rhinitis type - refill  med   Essential hypertension - controlled  Medication management - wellbutrin helps mood ok to restart   Hyperlipidemia - just went back onmed   recheck  in 3-4 months  Need for 23-polyvalent pneumococcal polysaccharide vaccine - Plan: Pneumococcal polysaccharide vaccine 23-valent greater than or equal to 2yo subcutaneous/IM  Elevated platelet count (HCC) - n oob iron defic will follow   Patient Care Team: Madelin Headings, MD as PCP - General Barbaraann Share, MD (Pulmonary Disease) Henreitta Leber, PA-C as Physician Assistant (Obstetrics and Gynecology) Patient Instructions  Go back on the Wellbutrin  Recheck labs  in 3-4 months and then decide follow up . Will refer for colonoscopy  Routine.   Health Maintenance, Female Adopting a healthy lifestyle and getting preventive care can go a long way to promote health and wellness. Talk with your health care provider  about what schedule of regular examinations is right for you. This is a good chance for you to check in with your provider about disease prevention  and staying healthy. In between checkups, there are plenty of things you can do on your own. Experts have done a lot of research about which lifestyle changes and preventive measures are most likely to keep you healthy. Ask your health care provider for more information. WEIGHT AND DIET  Eat a healthy diet  Be sure to include plenty of vegetables, fruits, low-fat dairy products, and lean protein.  Do not eat a lot of foods high in solid fats, added sugars, or salt.  Get regular exercise. This is one of the most important things you can do for your health.  Most adults should exercise for at least 150 minutes each week. The exercise should increase your heart rate and make you sweat (moderate-intensity exercise).  Most adults should also do strengthening exercises at least twice a week. This is in addition to the moderate-intensity exercise.  Maintain a healthy weight  Body mass index (BMI) is a measurement that can be used to identify possible weight problems. It estimates body fat based on height and weight. Your health care provider can help determine your BMI and help you achieve or maintain a healthy weight.  For females 62 years of age and older:   A BMI below 18.5 is considered underweight.  A BMI of 18.5 to 24.9 is normal.  A BMI of 25 to 29.9 is considered overweight.  A BMI of 30 and above is considered obese.  Watch levels of cholesterol and blood lipids  You should start having your blood tested for lipids and cholesterol at 52 years of age, then have this test every 5 years.  You may need to have your cholesterol levels checked more often if:  Your lipid or cholesterol levels are high.  You are older than 52 years of age.  You are at high risk for heart disease.  CANCER SCREENING   Lung Cancer  Lung cancer screening is recommended for adults 23-12 years old who are at high risk for lung cancer because of a history of smoking.  A yearly low-dose CT scan of  the lungs is recommended for people who:  Currently smoke.  Have quit within the past 15 years.  Have at least a 30-pack-year history of smoking. A pack year is smoking an average of one pack of cigarettes a day for 1 year.  Yearly screening should continue until it has been 15 years since you quit.  Yearly screening should stop if you develop a health problem that would prevent you from having lung cancer treatment.  Breast Cancer  Practice breast self-awareness. This means understanding how your breasts normally appear and feel.  It also means doing regular breast self-exams. Let your health care provider know about any changes, no matter how small.  If you are in your 20s or 30s, you should have a clinical breast exam (CBE) by a health care provider every 1-3 years as part of a regular health exam.  If you are 73 or older, have a CBE every year. Also consider having a breast X-ray (mammogram) every year.  If you have a family history of breast cancer, talk to your health care provider about genetic screening.  If you are at high risk for breast cancer, talk to your health care provider about having an MRI and a mammogram every year.  Breast cancer gene (  BRCA) assessment is recommended for women who have family members with BRCA-related cancers. BRCA-related cancers include:  Breast.  Ovarian.  Tubal.  Peritoneal cancers.  Results of the assessment will determine the need for genetic counseling and BRCA1 and BRCA2 testing. Cervical Cancer Your health care provider may recommend that you be screened regularly for cancer of the pelvic organs (ovaries, uterus, and vagina). This screening involves a pelvic examination, including checking for microscopic changes to the surface of your cervix (Pap test). You may be encouraged to have this screening done every 3 years, beginning at age 50.  For women ages 69-65, health care providers may recommend pelvic exams and Pap testing every  3 years, or they may recommend the Pap and pelvic exam, combined with testing for human papilloma virus (HPV), every 5 years. Some types of HPV increase your risk of cervical cancer. Testing for HPV may also be done on women of any age with unclear Pap test results.  Other health care providers may not recommend any screening for nonpregnant women who are considered low risk for pelvic cancer and who do not have symptoms. Ask your health care provider if a screening pelvic exam is right for you.  If you have had past treatment for cervical cancer or a condition that could lead to cancer, you need Pap tests and screening for cancer for at least 20 years after your treatment. If Pap tests have been discontinued, your risk factors (such as having a new sexual partner) need to be reassessed to determine if screening should resume. Some women have medical problems that increase the chance of getting cervical cancer. In these cases, your health care provider may recommend more frequent screening and Pap tests. Colorectal Cancer  This type of cancer can be detected and often prevented.  Routine colorectal cancer screening usually begins at 52 years of age and continues through 52 years of age.  Your health care provider may recommend screening at an earlier age if you have risk factors for colon cancer.  Your health care provider may also recommend using home test kits to check for hidden blood in the stool.  A small camera at the end of a tube can be used to examine your colon directly (sigmoidoscopy or colonoscopy). This is done to check for the earliest forms of colorectal cancer.  Routine screening usually begins at age 1.  Direct examination of the colon should be repeated every 5-10 years through 52 years of age. However, you may need to be screened more often if early forms of precancerous polyps or small growths are found. Skin Cancer  Check your skin from head to toe regularly.  Tell your  health care provider about any new moles or changes in moles, especially if there is a change in a mole's shape or color.  Also tell your health care provider if you have a mole that is larger than the size of a pencil eraser.  Always use sunscreen. Apply sunscreen liberally and repeatedly throughout the day.  Protect yourself by wearing long sleeves, pants, a wide-brimmed hat, and sunglasses whenever you are outside. HEART DISEASE, DIABETES, AND HIGH BLOOD PRESSURE   High blood pressure causes heart disease and increases the risk of stroke. High blood pressure is more likely to develop in:  People who have blood pressure in the high end of the normal range (130-139/85-89 mm Hg).  People who are overweight or obese.  People who are African American.  If you are 18-39 years  of age, have your blood pressure checked every 3-5 years. If you are 58 years of age or older, have your blood pressure checked every year. You should have your blood pressure measured twice--once when you are at a hospital or clinic, and once when you are not at a hospital or clinic. Record the average of the two measurements. To check your blood pressure when you are not at a hospital or clinic, you can use:  An automated blood pressure machine at a pharmacy.  A home blood pressure monitor.  If you are between 68 years and 72 years old, ask your health care provider if you should take aspirin to prevent strokes.  Have regular diabetes screenings. This involves taking a blood sample to check your fasting blood sugar level.  If you are at a normal weight and have a low risk for diabetes, have this test once every three years after 52 years of age.  If you are overweight and have a high risk for diabetes, consider being tested at a younger age or more often. PREVENTING INFECTION  Hepatitis B  If you have a higher risk for hepatitis B, you should be screened for this virus. You are considered at high risk for  hepatitis B if:  You were born in a country where hepatitis B is common. Ask your health care provider which countries are considered high risk.  Your parents were born in a high-risk country, and you have not been immunized against hepatitis B (hepatitis B vaccine).  You have HIV or AIDS.  You use needles to inject street drugs.  You live with someone who has hepatitis B.  You have had sex with someone who has hepatitis B.  You get hemodialysis treatment.  You take certain medicines for conditions, including cancer, organ transplantation, and autoimmune conditions. Hepatitis C  Blood testing is recommended for:  Everyone born from [birth year] through 1967.  Anyone with known risk factors for hepatitis C. Sexually transmitted infections (STIs)  You should be screened for sexually transmitted infections (STIs) including gonorrhea and chlamydia if:  You are sexually active and are younger than 52 years of age.  You are older than 52 years of age and your health care provider tells you that you are at risk for this type of infection.  Your sexual activity has changed since you were last screened and you are at an increased risk for chlamydia or gonorrhea. Ask your health care provider if you are at risk.  If you do not have HIV, but are at risk, it may be recommended that you take a prescription medicine daily to prevent HIV infection. This is called pre-exposure prophylaxis (PrEP). You are considered at risk if:  You are sexually active and do not regularly use condoms or know the HIV status of your partner(s).  You take drugs by injection.  You are sexually active with a partner who has HIV. Talk with your health care provider about whether you are at high risk of being infected with HIV. If you choose to begin PrEP, you should first be tested for HIV. You should then be tested every 3 months for as long as you are taking PrEP.  PREGNANCY   If you are premenopausal and you may  become pregnant, ask your health care provider about preconception counseling.  If you may become pregnant, take 400 to 800 micrograms (mcg) of folic acid every day.  If you want to prevent pregnancy, talk to your health care provider  about birth control (contraception). OSTEOPOROSIS AND MENOPAUSE   Osteoporosis is a disease in which the bones lose minerals and strength with aging. This can result in serious bone fractures. Your risk for osteoporosis can be identified using a bone density scan.  If you are 48 years of age or older, or if you are at risk for osteoporosis and fractures, ask your health care provider if you should be screened.  Ask your health care provider whether you should take a calcium or vitamin D supplement to lower your risk for osteoporosis.  Menopause may have certain physical symptoms and risks.  Hormone replacement therapy may reduce some of these symptoms and risks. Talk to your health care provider about whether hormone replacement therapy is right for you.  HOME CARE INSTRUCTIONS   Schedule regular health, dental, and eye exams.  Stay current with your immunizations.   Do not use any tobacco products including cigarettes, chewing tobacco, or electronic cigarettes.  If you are pregnant, do not drink alcohol.  If you are breastfeeding, limit how much and how often you drink alcohol.  Limit alcohol intake to no more than 1 drink per day for nonpregnant women. One drink equals 12 ounces of beer, 5 ounces of wine, or 1 ounces of hard liquor.  Do not use street drugs.  Do not share needles.  Ask your health care provider for help if you need support or information about quitting drugs.  Tell your health care provider if you often feel depressed.  Tell your health care provider if you have ever been abused or do not feel safe at home.   This information is not intended to replace advice given to you by your health care provider. Make sure you discuss  any questions you have with your health care provider.   Document Released: 10/04/2010 Document Revised: 04/11/2014 Document Reviewed: 02/20/2013 Elsevier Interactive Patient Education 2016 [place] K. Panosh M.D.

## 2015-06-10 ENCOUNTER — Encounter: Payer: Self-pay | Admitting: Gastroenterology

## 2015-08-10 ENCOUNTER — Ambulatory Visit (AMBULATORY_SURGERY_CENTER): Payer: Self-pay | Admitting: *Deleted

## 2015-08-10 ENCOUNTER — Encounter: Payer: Self-pay | Admitting: Gastroenterology

## 2015-08-10 VITALS — Ht 66.0 in | Wt 233.4 lb

## 2015-08-10 DIAGNOSIS — Z1211 Encounter for screening for malignant neoplasm of colon: Secondary | ICD-10-CM

## 2015-08-10 MED ORDER — SUPREP BOWEL PREP KIT 17.5-3.13-1.6 GM/177ML PO SOLN: 1.0000 | Freq: Once | ORAL | Status: DC

## 2015-08-10 NOTE — Progress Notes (Signed)
Patient denies any allergies to egg or soy products. Patient denies complications with anesthesia/sedation.  Patient has OSA but does not use CPAP, does not have the machine.  Patient denies oxygen use at home and denies diet medications. Emmi instructions for colonoscopy explained and given to patient.

## 2015-08-20 ENCOUNTER — Telehealth: Payer: Self-pay | Admitting: Gastroenterology

## 2015-08-21 ENCOUNTER — Encounter: Payer: Self-pay | Admitting: Gastroenterology

## 2015-08-21 NOTE — Telephone Encounter (Signed)
No charge this time. 

## 2015-08-24 ENCOUNTER — Encounter: Payer: Commercial Managed Care - HMO | Admitting: Gastroenterology

## 2015-10-09 ENCOUNTER — Other Ambulatory Visit: Payer: Self-pay | Admitting: Family Medicine

## 2015-10-09 DIAGNOSIS — E119 Type 2 diabetes mellitus without complications: Secondary | ICD-10-CM

## 2015-10-09 DIAGNOSIS — E785 Hyperlipidemia, unspecified: Secondary | ICD-10-CM

## 2015-10-09 DIAGNOSIS — R7989 Other specified abnormal findings of blood chemistry: Secondary | ICD-10-CM

## 2015-10-12 ENCOUNTER — Other Ambulatory Visit (INDEPENDENT_AMBULATORY_CARE_PROVIDER_SITE_OTHER): Payer: Commercial Managed Care - HMO

## 2015-10-12 DIAGNOSIS — E785 Hyperlipidemia, unspecified: Secondary | ICD-10-CM

## 2015-10-12 DIAGNOSIS — R7989 Other specified abnormal findings of blood chemistry: Secondary | ICD-10-CM

## 2015-10-12 DIAGNOSIS — E119 Type 2 diabetes mellitus without complications: Secondary | ICD-10-CM | POA: Diagnosis not present

## 2015-10-12 DIAGNOSIS — D473 Essential (hemorrhagic) thrombocythemia: Secondary | ICD-10-CM

## 2015-10-12 LAB — CBC WITH DIFFERENTIAL/PLATELET
Basophils Absolute: 0 10*3/uL (ref 0.0–0.1)
Basophils Relative: 0.6 % (ref 0.0–3.0)
EOS PCT: 3.7 % (ref 0.0–5.0)
Eosinophils Absolute: 0.2 10*3/uL (ref 0.0–0.7)
HCT: 37.3 % (ref 36.0–46.0)
HEMOGLOBIN: 12.5 g/dL (ref 12.0–15.0)
Lymphocytes Relative: 38.1 % (ref 12.0–46.0)
Lymphs Abs: 2.4 10*3/uL (ref 0.7–4.0)
MCHC: 33.4 g/dL (ref 30.0–36.0)
MCV: 86.2 fl (ref 78.0–100.0)
MONO ABS: 0.4 10*3/uL (ref 0.1–1.0)
MONOS PCT: 5.8 % (ref 3.0–12.0)
Neutro Abs: 3.3 10*3/uL (ref 1.4–7.7)
Neutrophils Relative %: 51.8 % (ref 43.0–77.0)
PLATELETS: 419 10*3/uL — AB (ref 150.0–400.0)
RBC: 4.32 Mil/uL (ref 3.87–5.11)
RDW: 14.3 % (ref 11.5–15.5)
WBC: 6.4 10*3/uL (ref 4.0–10.5)

## 2015-10-12 LAB — LIPID PANEL
CHOL/HDL RATIO: 5
Cholesterol: 202 mg/dL — ABNORMAL HIGH (ref 0–200)
HDL: 41.7 mg/dL (ref >39.00)
LDL CALC: 143 mg/dL — AB (ref 0–99)
NONHDL: 160.5
Triglycerides: 86 mg/dL (ref 0.0–149.0)
VLDL: 17.2 mg/dL (ref 0.0–40.0)

## 2015-10-12 LAB — HEMOGLOBIN A1C: Hgb A1c MFr Bld: 7.1 % — ABNORMAL HIGH (ref 4.6–6.5)

## 2015-10-19 ENCOUNTER — Ambulatory Visit: Payer: Commercial Managed Care - HMO | Admitting: Internal Medicine

## 2015-10-19 NOTE — Progress Notes (Signed)
Pre visit review using our clinic review tool, if applicable. No additional management support is needed unless otherwise documented below in the visit note.  Chief Complaint  Patient presents with  . Follow-up    HPI: Crystal Townsend 50 y.o.  Fu  Dm taking onglyza prestty well no se  LIPIDS missing  atorva about 2 xc per week BP  Missed med for a week  Son legal troubles  And bathroom control .  wellbutrin  Helping  Energy and getting through asks for refill.  onglyza no  Problem  Tends to get off  Weekends  .    ? About  Zoster vaccine ROS: See pertinent positives and negatives per HPI. Last tramadol used 5 17 a sbeeded  Last aleve used 3 17   Past Medical History  Diagnosis Date  . Fatigue     Hx, no current problems as of 08/10/15  . Snoring     had a sleep study mild osa 2009  . Other dysfunctions of sleep stages or arousal from sleep   . Chest pain     Hx, no problems as of 08/10/15  . Impaired fasting glucose   . Hypertension   . Knee pain, bilateral     Hx, no current problems as of 08/10/15  . Allergic rhinitis   . Anxiety   . Leukoplakia oral mucosa     2011  . IBS (irritable bowel syndrome)     no meds, diet controlled  . Menorrhagia   . Fibroids   . Dysmenorrhea   . OSA (obstructive sleep apnea)     does not use cpap, never went to get machine  . Depression   . Allergy   . Sleep apnea     Does not use CPAP  . Diabetes mellitus     Type II  . GERD (gastroesophageal reflux disease)     diet controlled, no meds  . Hyperlipidemia     Family History  Problem Relation Age of Onset  . Lung cancer Mother   . Cancer Mother     lung   . Prostate cancer Father   . Cancer Father     prostate  . Diabetes Father   . Hyperlipidemia Father   . Hypertension Father   . Asthma Father   . Cancer Maternal Aunt     breast  . Cancer Maternal Aunt     breast  . Cancer Maternal Aunt     breast  . Colon polyps Neg Hx   . Esophageal cancer Neg Hx   . Rectal  cancer Neg Hx   . Stomach cancer Neg Hx     Social History   Social History  . Marital Status: Married    Spouse Name: N/A  . Number of Children: N/A  . Years of Education: N/A   Social History Main Topics  . Smoking status: Never Smoker   . Smokeless tobacco: Never Used  . Alcohol Use: Yes     Comment: social wine  . Drug Use: No  . Sexual Activity: Not Currently    Birth Control/ Protection: IUD     Comment: mirena - inserted in 2015   Other Topics Concern  . 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 acetaminophen (TYLENOL) 325 MG tablet Take 2 tablets (650 mg total) by mouth every 8 (eight) hours as needed for pain (headache). 100 tablet 2  . aspirin 81 MG EC tablet Take 1 tablet (81 mg total) by mouth daily. Swallow whole. 100 tablet 2  . atorvastatin (LIPITOR) 20 MG tablet Take 1 tablet by mouth  daily 30 tablet 0  . Biotin 1000 MCG tablet Take 1,000 mcg by mouth 3 (three) times daily.    . fluticasone (FLONASE) 50 MCG/ACT nasal spray 2 spray each nostril qd 16 g 11  . levonorgestrel (MIRENA) 20 MCG/24HR IUD 1 each by Intrauterine route once.      . naproxen sodium (ANAPROX DS) 550 MG tablet Take 1 tablet (550 mg total) by mouth 2 (two) times daily with a meal. 40 tablet 0  . ONGLYZA 5 MG TABS tablet Take 1 tablet by mouth  daily 90 tablet 0  . telmisartan-hydrochlorothiazide (MICARDIS HCT) 80-12.5 MG tablet Take 1 tablet by mouth  daily 90 tablet 0  . traMADol (ULTRAM) 50 MG tablet Take 1 tablet (50 mg total) by mouth every 8 (eight) hours as needed. 30 tablet 0  . buPROPion (WELLBUTRIN XL) 300 MG 24 hr tablet Take 1 tablet (300 mg total) by mouth daily. 30 tablet 5  . cetirizine (ZYRTEC) 10 MG tablet TAKE 1 TABLET BY MOUTH DAILY 30 tablet 11  . SUPREP  BOWEL PREP SOLN Take 1 kit by mouth once. Brand Name Only.  No Substitutions.  Suprep Bowel Kit. (Patient not taking: Reported on 10/20/2015) 177 mL 0   No facility-administered medications prior to visit.     EXAM:  BP 126/90 mmHg  Temp(Src) 98.5 F (36.9 C) (Oral)  Wt 235 lb 6.4 oz (106.777 kg)  Body mass index is 38.01 kg/(m^2).  GENERAL: vitals reviewed and listed above, alert, oriented, appears well hydrated and in no acute distress HEENT: atraumatic, conjunctiva  clear, no obvious abnormalities on inspection of external nose and ears PSYCH: pleasant and cooperative, no obvious depression or anxiety Lab Results  Component Value Date   WBC 6.4 10/12/2015   HGB 12.5 10/12/2015   HCT 37.3 10/12/2015   PLT 419.0* 10/12/2015   GLUCOSE 124* 05/22/2015   CHOL 202* 10/12/2015   TRIG 86.0 10/12/2015   HDL 41.70 10/12/2015   LDLDIRECT 162.5 03/07/2013   LDLCALC 143* 10/12/2015   ALT 12 05/22/2015   AST 15 05/22/2015   NA 143 05/22/2015   K 4.0 05/22/2015   CL 104 05/22/2015   CREATININE 1.00 05/22/2015   BUN 12 05/22/2015   CO2 30 05/22/2015   TSH 1.03 05/22/2015   HGBA1C 7.1* 10/12/2015   MICROALBUR 1.2 02/05/2009   BP Readings from Last 3 Encounters:  10/20/15 126/90  06/03/15 134/86  03/05/15 137/78   Wt Readings from Last 3 Encounters:  10/20/15 235 lb 6.4 oz (106.777 kg)  08/10/15 233 lb 6.4 oz (105.87 kg)  06/03/15 236 lb 14.4 oz (107.457 kg)  lab reviewe   ASSESSMENT AND PLAN:  Discussed the following assessment and plan:  Type 2 diabetes mellitus without complication, without long-term current use of insulin (HCC)  Diabetes mellitus without complication (HCC)  Medication management  Elevated platelet count ([place])  Hyperlipidemia  Need for Tdap vaccination - Plan: Tdap vaccine greater than or equal to 7yo IM   Expectant management.  Risk benefit of medication discussed.  Total visit 63mns > 50% spent counseling and coordinating care as indicated in  above note and in instructions to patient .  Can get  a1c at visit  -Patient advised to return or notify health care team  if symptoms worsen ,persist or new concerns arise.  Patient Instructions  Trial jardiance   For dm control  Instead of  onglyza   Trial co pay coupon.    This may help wieght BP and  Diabetes control .      Standley Brooking. Maeson Purohit M.D.

## 2015-10-20 ENCOUNTER — Ambulatory Visit (INDEPENDENT_AMBULATORY_CARE_PROVIDER_SITE_OTHER): Payer: Commercial Managed Care - HMO | Admitting: Internal Medicine

## 2015-10-20 ENCOUNTER — Encounter: Payer: Self-pay | Admitting: Internal Medicine

## 2015-10-20 VITALS — BP 126/90 | Temp 98.5°F | Wt 235.4 lb

## 2015-10-20 DIAGNOSIS — Z23 Encounter for immunization: Secondary | ICD-10-CM | POA: Diagnosis not present

## 2015-10-20 DIAGNOSIS — R7989 Other specified abnormal findings of blood chemistry: Secondary | ICD-10-CM

## 2015-10-20 DIAGNOSIS — E119 Type 2 diabetes mellitus without complications: Secondary | ICD-10-CM

## 2015-10-20 DIAGNOSIS — Z79899 Other long term (current) drug therapy: Secondary | ICD-10-CM | POA: Diagnosis not present

## 2015-10-20 DIAGNOSIS — E785 Hyperlipidemia, unspecified: Secondary | ICD-10-CM

## 2015-10-20 DIAGNOSIS — D473 Essential (hemorrhagic) thrombocythemia: Secondary | ICD-10-CM

## 2015-10-20 MED ORDER — CETIRIZINE HCL 10 MG PO TABS: ORAL_TABLET | ORAL | Status: DC

## 2015-10-20 MED ORDER — EMPAGLIFLOZIN 10 MG PO TABS: 10.0000 mg | ORAL_TABLET | Freq: Every day | ORAL | Status: DC

## 2015-10-20 MED ORDER — BUPROPION HCL ER (XL) 300 MG PO TB24: 300.0000 mg | ORAL_TABLET | Freq: Every day | ORAL | Status: DC

## 2015-10-20 NOTE — Assessment & Plan Note (Signed)
Not at goal  Encouraged to take med qd as best possible

## 2015-10-20 NOTE — Assessment & Plan Note (Signed)
onglyza a1c now 7.1 couldn't tolerated metformin   Disc trial jardiance   Other risk bneefot coupoin given  10 mg and fu   Expectant management.

## 2015-10-20 NOTE — Patient Instructions (Signed)
Trial jardiance   For dm control  Instead of  onglyza   Trial co pay coupon.    This may help wieght BP and  Diabetes control .

## 2016-01-05 IMAGING — MR MR BREAST BX W/ LOC DEV 1ST LEASION IMAGE BX SPEC MR GUIDE*R*
7 of 10 series · 33 of 48 positions shown · IV contrast (20ml Multihance)
Comparison: Previous exams.

ADDENDUM:
Pathology reveals Right breast intraductal papilloma. This was found
to be concordant by Dr. Tono Jiron. Pathology results were
discussed with the patient via telephone. The patient reported
tenderness at the biopsy site and is doing well otherwise. Post
biopsy care and instructions were reviewed and questions were
answered. The patient was encouraged to call The [REDACTED] with any additional questions and or concerns. A
surgical referral was arranged with Dr. Oshioke Nimmyel of [REDACTED] on December 26, 2014.

Pathology results reported by Shenile Zimines RN on December 12, 2014.
CLINICAL DATA: 49-year-old female for MRI guided biopsy of a
suspicious right breast mass and adjacent linear enhancement. The
linear enhancement noted on MRI dated 12/04/2014 is less prominent
on today's exam.
EXAM:
MRI GUIDED CORE NEEDLE BIOPSY OF THE RIGHT BREAST
TECHNIQUE: Multiplanar, multisequence MR imaging of the right breast was
performed both before and after administration of intravenous
contrast.
CONTRAST:  20 mL of multihance

[Series 2: axial pre-cm · axial · non-contrast · 1.3mm · 0.73mm/px · z∈[-97,+89]mm · 4 of 144 slices shown]
[im 1/144]
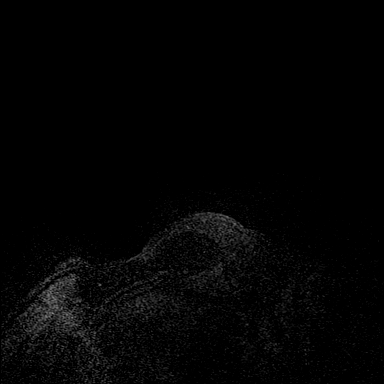
[im 48/144]
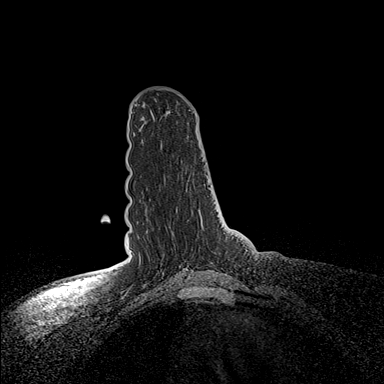
[im 96/144]
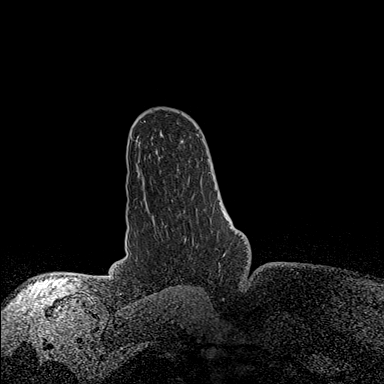
[im 144/144]
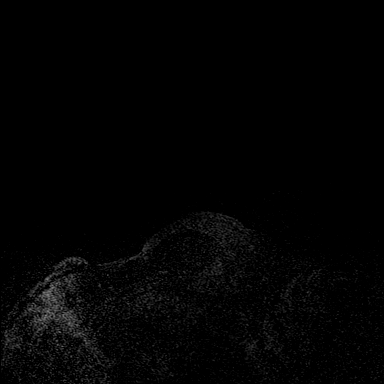

[Series 3: axial pre-cm_s2_(id) · axial · non-contrast · 1.3mm · 0.73mm/px · z∈[-97,+89]mm · 4 of 144 slices shown]
[im 1/144]
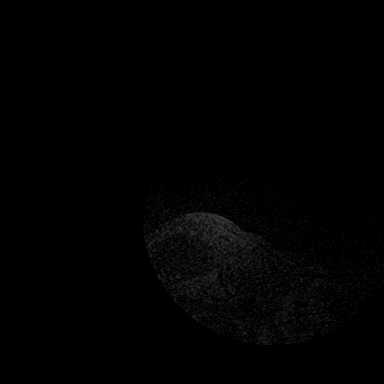
[im 48/144]
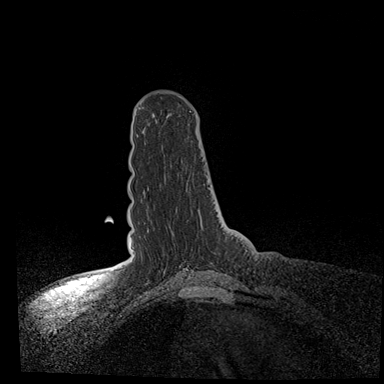
[im 96/144]
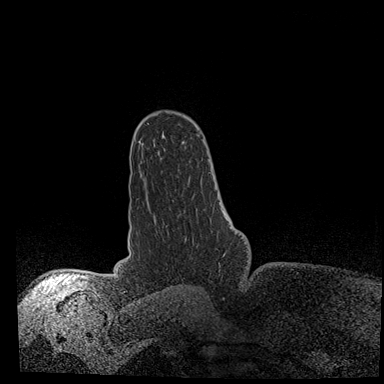
[im 144/144]
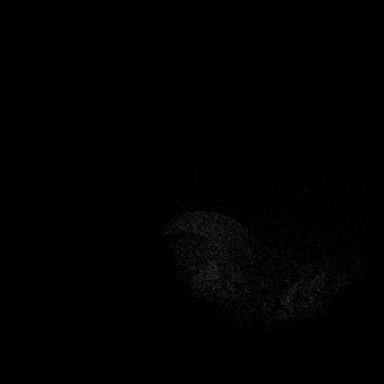

[Series 4: axial post 20 · axial · 1.3mm · 0.73mm/px · z∈[-97,+89]mm · 5 of 144 slices shown (1 of 3)]
[im 1/144]
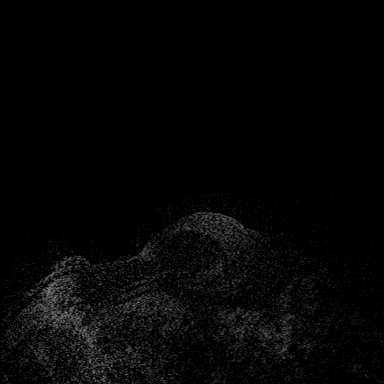
[im 36/144]
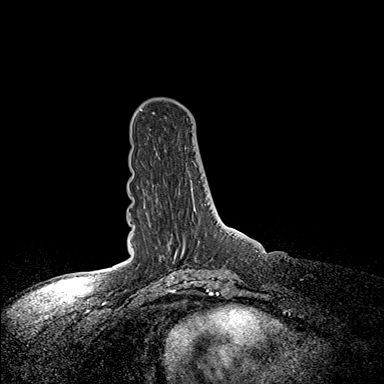
[im 72/144]
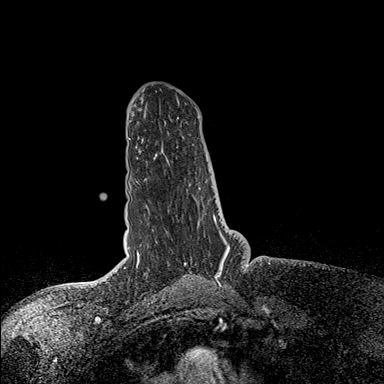
[im 108/144]
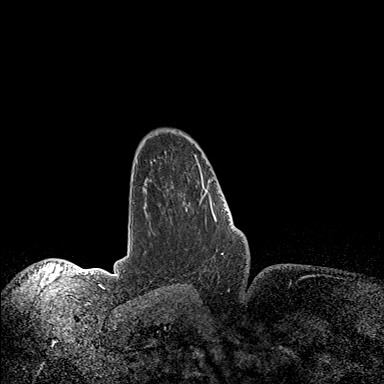
[im 144/144]
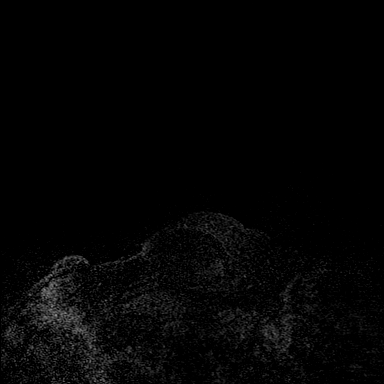

[Series 5: axial post 20 · axial · 1.3mm · 0.73mm/px · z∈[-97,+89]mm · 5 of 144 slices shown (2 of 3)]
[im 1/144]
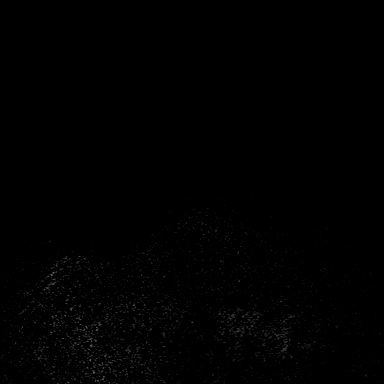
[im 36/144]
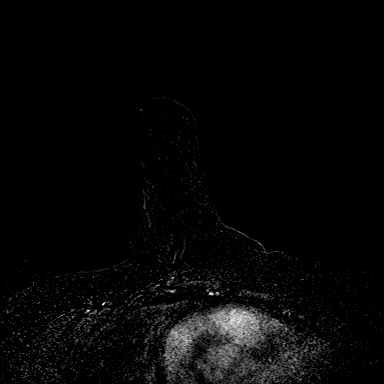
[im 72/144]
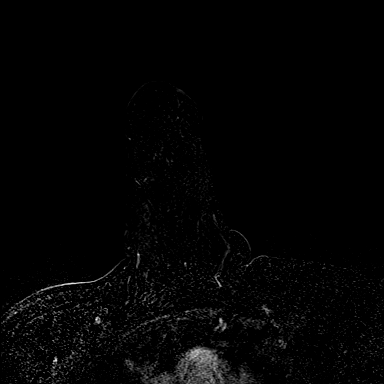
[im 108/144]
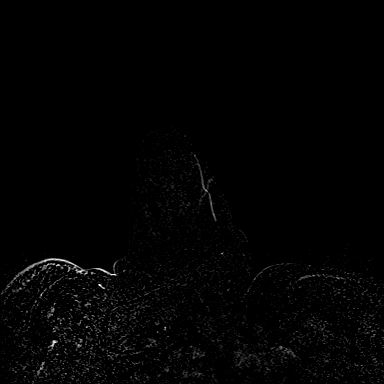
[im 144/144]
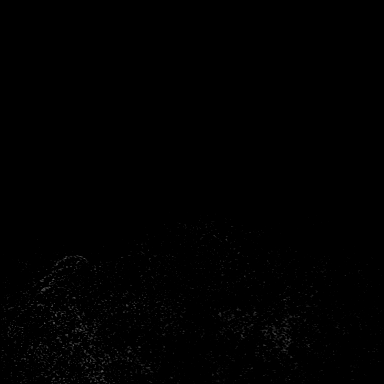

[Series 6: axial post 20 · axial · 1.3mm · 0.73mm/px · z∈[-97,+89]mm · 5 of 144 slices shown (3 of 3)]
[im 1/144]
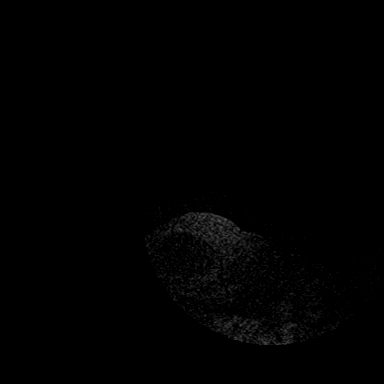
[im 36/144]
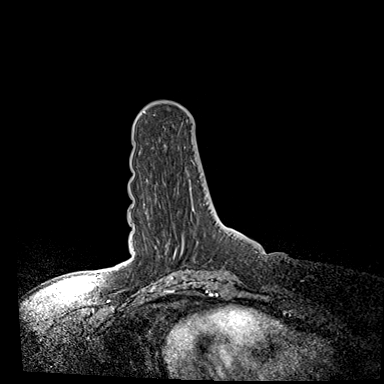
[im 72/144]
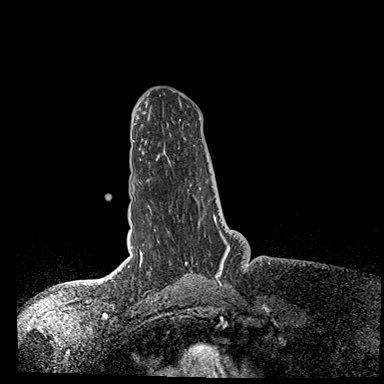
[im 108/144]
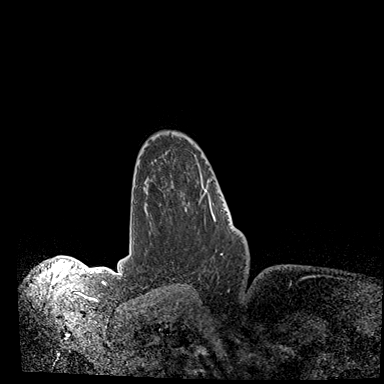
[im 144/144]
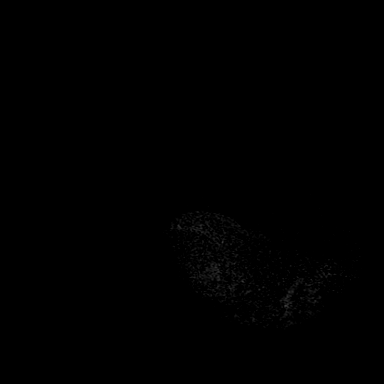

[Series 7: axial confirmation · axial · 1.3mm · 0.73mm/px · z∈[-97,+89]mm · 5 of 144 slices shown]
[im 1/144]
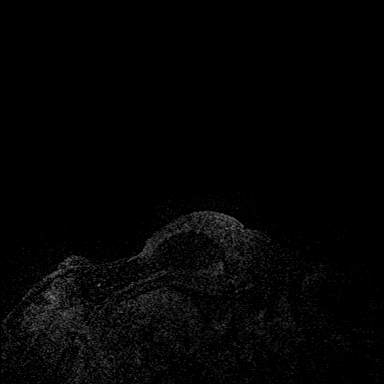
[im 36/144]
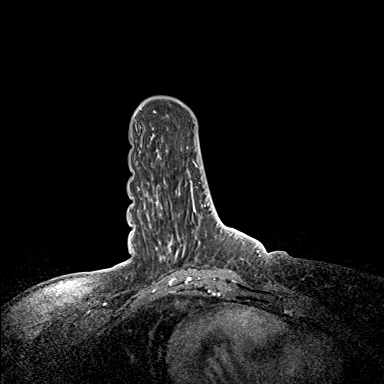
[im 72/144]
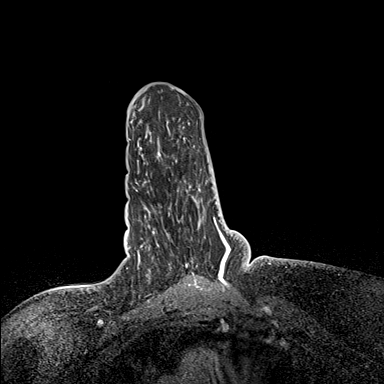
[im 108/144]
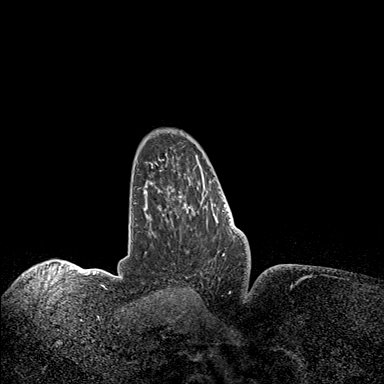
[im 144/144]
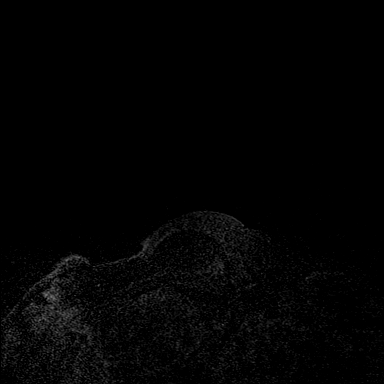

[Series 8: axial confirmation_sub · axial · 1.3mm · 0.73mm/px · z∈[-97,+89]mm · 5 of 144 slices shown]
[im 1/144]
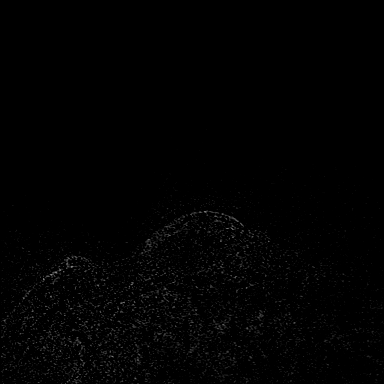
[im 36/144]
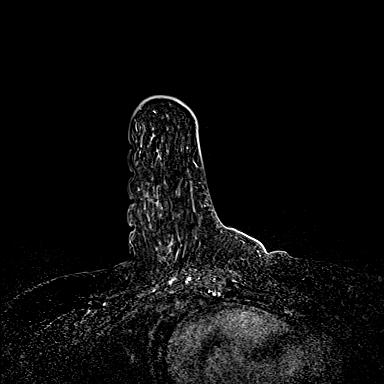
[im 72/144]
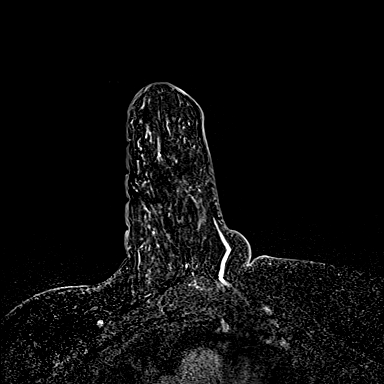
[im 108/144]
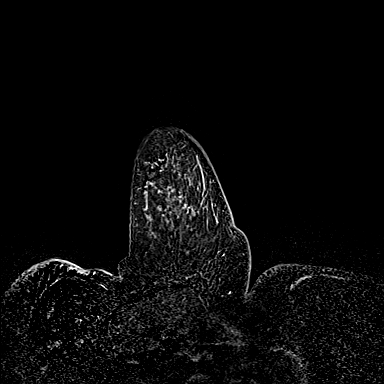
[im 144/144]
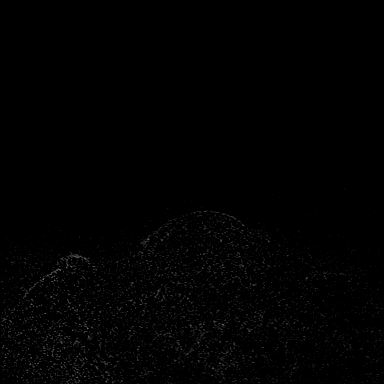

[33 of 48 positions shown; findings below may reference images not displayed]

FINDINGS: I met with the patient, and we discussed the procedure of MRI guided
biopsy, including risks, benefits, and alternatives. Specifically,
we discussed the risks of infection, bleeding, tissue injury, clip
migration, and inadequate sampling. Informed, written consent was
given. The usual time out protocol was performed immediately prior
to the procedure.

Using sterile technique, 2% Lidocaine, MRI guidance, and a 9 gauge
vacuum assisted device, biopsy was performed of an indeterminate
right breast mass and adjacent linear enhancement in the lower,
slightly inner right breast using a lateral to medial approach. At
the conclusion of the procedure, a dumbbell-shaped tissue marker
clip was deployed into the biopsy cavity. Follow-up 2-view mammogram
was performed and dictated separately.
IMPRESSION: MRI guided biopsy of the right breast. No apparent complications.

## 2016-01-20 NOTE — Progress Notes (Signed)
Pre visit review using our clinic review tool, if applicable. No additional management support is needed unless otherwise documented below in the visit note.  Chief Complaint  Patient presents with  . Follow-up    HPI: Crystal Townsend 50 y.o.   Fu dm  Trial jardiance instead of onglyza She's not checking blood sugars but feels well and is urinating more but no side effects of the medicine tolerating it fine. Blood pressure seems to be good start walking more going up steps instead of the elevator. Would like a refill the Wellbutrin working well the sertraline that she uses as needed much less expensive is a prescription. Needs refill of the atorvastatin2015 side effect. No major visual changes numbness. ROS: See pertinent positives and negatives per HPI.  Past Medical History:  Diagnosis Date  . Allergic rhinitis   . Allergy   . Anxiety   . Chest pain    Hx, no problems as of 08/10/15  . Depression   . Diabetes mellitus    Type II  . Dysmenorrhea   . Fatigue    Hx, no current problems as of 08/10/15  . Fibroids   . GERD (gastroesophageal reflux disease)    diet controlled, no meds  . Hyperlipidemia   . Hypertension   . IBS (irritable bowel syndrome)    no meds, diet controlled  . Impaired fasting glucose   . Knee pain, bilateral    Hx, no current problems as of 08/10/15  . Leukoplakia oral mucosa    2011  . Menorrhagia   . OSA (obstructive sleep apnea)    does not use cpap, never went to get machine  . Other dysfunctions of sleep stages or arousal from sleep   . Sleep apnea    Does not use CPAP  . Snoring    had a sleep study mild osa 2009    Family History  Problem Relation Age of Onset  . Lung cancer Mother   . Cancer Mother     lung   . Prostate cancer Father   . Cancer Father     prostate  . Diabetes Father   . Hyperlipidemia Father   . Hypertension Father   . Asthma Father   . Cancer Maternal Aunt     breast  . Cancer Maternal Aunt     breast  .  Cancer Maternal Aunt     breast  . Colon polyps Neg Hx   . Esophageal cancer Neg Hx   . Rectal cancer Neg Hx   . Stomach cancer Neg Hx     Social History   Social History  . Marital status: Married    Spouse name: N/A  . Number of children: N/A  . Years of education: N/A   Social History Main Topics  . Smoking status: Never Smoker  . Smokeless tobacco: Never Used  . Alcohol use Yes     Comment: social wine  . Drug use: No  . Sexual activity: Not Currently    Birth control/ protection: IUD     Comment: mirena - inserted in 2015   Other Topics Concern  . 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 acetaminophen (TYLENOL) 325 MG tablet Take 2  tablets (650 mg total) by mouth every 8 (eight) hours as needed for pain (headache). 100 tablet 2  . aspirin 81 MG EC tablet Take 1 tablet (81 mg total) by mouth daily. Swallow whole. 100 tablet 2  . Biotin 1000 MCG tablet Take 1,000 mcg by mouth 3 (three) times daily.    . fluticasone (FLONASE) 50 MCG/ACT nasal spray 2 spray each nostril qd 16 g 11  . levonorgestrel (MIRENA) 20 MCG/24HR IUD 1 each by Intrauterine route once.      [place] Gunning BOWEL PREP SOLN Take 1 kit by mouth once. Brand Name Only.  No Substitutions.  Suprep Bowel Kit. 177 mL 0  . telmisartan-hydrochlorothiazide (MICARDIS HCT) 80-12.5 MG tablet Take 1 tablet by mouth  daily 90 tablet 0  . traMADol (ULTRAM) 50 MG tablet Take 1 tablet (50 mg total) by mouth every 8 (eight) hours as needed. 30 tablet 0  . atorvastatin (LIPITOR) 20 MG tablet Take 1 tablet by mouth  daily 30 tablet 0  . buPROPion (WELLBUTRIN XL) 300 MG 24 hr tablet Take 1 tablet (300 mg total) by mouth daily. 30 tablet 5  . cetirizine (ZYRTEC) 10 MG tablet TAKE 1 TABLET BY MOUTH DAILY 30 tablet 11   . empagliflozin (JARDIANCE) 10 MG TABS tablet Take 10 mg by mouth daily. 30 tablet 3  . ONGLYZA 5 MG TABS tablet Take 1 tablet by mouth  daily 90 tablet 0   No facility-administered medications prior to visit.      EXAM:  BP 110/76 (BP Location: Right Arm, Patient Position: Sitting, Cuff Size: Large)   Temp 98.5 F (36.9 C) (Oral)   Wt 227 lb 9.6 oz (103.2 kg)   BMI 36.74 kg/m   Body mass index is 36.74 kg/m.  GENERAL: vitals reviewed and listed above, alert, oriented, appears well hydrated and in no acute distress HEENT: atraumatic, conjunctiva  clear, no obvious abnormalities on inspection of external nose and ears NECK: no obvious masses on inspection palpation  LUNGS: clear to auscultation bilaterally, no wheezes, rales or rhonchi, good air movement CV: HRRR, no clubbing cyanosis or  peripheral edema nl cap refill  MS: moves all extremities without noticeable focal  abnormality PSYCH: pleasant and cooperative, no obvious depression or anxiety Lab Results  Component Value Date   WBC 6.4 10/12/2015   HGB 12.5 10/12/2015   HCT 37.3 10/12/2015   PLT 419.0 (H) 10/12/2015   GLUCOSE 124 (H) 05/22/2015   CHOL 202 (H) 10/12/2015   TRIG 86.0 10/12/2015   HDL 41.70 10/12/2015   LDLDIRECT 162.5 03/07/2013   LDLCALC 143 (H) 10/12/2015   ALT 12 05/22/2015   AST 15 05/22/2015   NA 143 05/22/2015   K 4.0 05/22/2015   CL 104 05/22/2015   CREATININE 1.00 05/22/2015   BUN 12 05/22/2015   CO2 30 05/22/2015   TSH 1.03 05/22/2015   HGBA1C 6.7 01/21/2016   MICROALBUR 1.2 02/05/2009   Wt Readings from Last 3 Encounters:  01/21/16 227 lb 9.6 oz (103.2 kg)  10/20/15 235 lb 6.4 oz (106.8 kg)  08/10/15 233 lb 6.4 oz (105.9 kg)    ASSESSMENT AND PLAN:  Discussed the following assessment and plan:  Type 2 diabetes mellitus without complication, without long-term current use of insulin (HCC) - better control no se of med - Plan: POCT A1C  Essential hypertension - good control  continue  Medication management - refill wellbutrin and xyrtec as needed  Hyperlipidemia, unspecified hyperlipidemia type - refill med  Flu  vaccine today  -Patient advised to return or notify health care team  if symptoms worsen ,persist or new concerns arise.  Patient Instructions   Diabetes is better      Keep up activity and avoid   Simple sugars  bp .  Plan  CPX with labs and hg a1c  In March 2018 Wt Readings from Last 3 Encounters:  01/21/16 227 lb 9.6 oz (103.2 kg)  10/20/15 235 lb 6.4 oz (106.8 kg)  08/10/15 233 lb 6.4 oz (105.9 kg)       Wanda K. Panosh M.D.

## 2016-01-21 ENCOUNTER — Encounter: Payer: Self-pay | Admitting: Internal Medicine

## 2016-01-21 ENCOUNTER — Ambulatory Visit (INDEPENDENT_AMBULATORY_CARE_PROVIDER_SITE_OTHER): Payer: Commercial Managed Care - HMO | Admitting: Internal Medicine

## 2016-01-21 VITALS — BP 110/76 | Temp 98.5°F | Wt 227.6 lb

## 2016-01-21 DIAGNOSIS — I10 Essential (primary) hypertension: Secondary | ICD-10-CM

## 2016-01-21 DIAGNOSIS — Z79899 Other long term (current) drug therapy: Secondary | ICD-10-CM

## 2016-01-21 DIAGNOSIS — E119 Type 2 diabetes mellitus without complications: Secondary | ICD-10-CM

## 2016-01-21 DIAGNOSIS — Z23 Encounter for immunization: Secondary | ICD-10-CM

## 2016-01-21 DIAGNOSIS — E785 Hyperlipidemia, unspecified: Secondary | ICD-10-CM

## 2016-01-21 LAB — POCT GLYCOSYLATED HEMOGLOBIN (HGB A1C): HEMOGLOBIN A1C: 6.7

## 2016-01-21 MED ORDER — EMPAGLIFLOZIN 10 MG PO TABS: 10.0000 mg | ORAL_TABLET | Freq: Every day | ORAL | 3 refills | Status: DC

## 2016-01-21 MED ORDER — ATORVASTATIN CALCIUM 20 MG PO TABS: 20.0000 mg | ORAL_TABLET | Freq: Every day | ORAL | 2 refills | Status: DC

## 2016-01-21 MED ORDER — CETIRIZINE HCL 10 MG PO TABS: ORAL_TABLET | ORAL | 11 refills | Status: DC

## 2016-01-21 MED ORDER — BUPROPION HCL ER (XL) 300 MG PO TB24: 300.0000 mg | ORAL_TABLET | Freq: Every day | ORAL | 5 refills | Status: DC

## 2016-01-21 NOTE — Patient Instructions (Addendum)
Diabetes is better      Keep up activity and avoid   Simple sugars  bp .  Plan  CPX with labs and hg a1c  In March 2018 Wt Readings from Last 3 Encounters:  01/21/16 227 lb 9.6 oz (103.2 kg)  10/20/15 235 lb 6.4 oz (106.8 kg)  08/10/15 233 lb 6.4 oz (105.9 kg)

## 2016-01-21 NOTE — Addendum Note (Signed)
Addended by: Miles Costain T on: 01/21/2016 08:59 AM   Modules accepted: Orders

## 2016-03-06 ENCOUNTER — Other Ambulatory Visit: Payer: Self-pay | Admitting: Internal Medicine

## 2016-03-25 ENCOUNTER — Other Ambulatory Visit: Payer: Self-pay | Admitting: Internal Medicine

## 2016-04-25 ENCOUNTER — Other Ambulatory Visit: Payer: Self-pay | Admitting: Internal Medicine

## 2016-04-27 ENCOUNTER — Other Ambulatory Visit: Payer: Self-pay | Admitting: Family Medicine

## 2016-04-27 DIAGNOSIS — E119 Type 2 diabetes mellitus without complications: Secondary | ICD-10-CM

## 2016-04-27 DIAGNOSIS — Z0000 Encounter for general adult medical examination without abnormal findings: Secondary | ICD-10-CM

## 2016-04-27 NOTE — Telephone Encounter (Signed)
Sent to the pharmacy by e-scribe for 2 months. Pt has upcoming yearly on 06/20/16

## 2016-06-01 ENCOUNTER — Other Ambulatory Visit: Payer: Commercial Managed Care - HMO

## 2016-06-10 NOTE — Progress Notes (Deleted)
No chief complaint on file.   HPI: Patient  Crystal Townsend  51 y.o. comes in today for Preventive Health Care visit   Health Maintenance  Topic Date Due  . Hepatitis C Screening  [birth year]  . HIV Screening  09/15/1978  . COLONOSCOPY  08/06/2015  . OPHTHALMOLOGY EXAM  01/28/2016  . FOOT EXAM  06/02/2016  . HEMOGLOBIN A1C  07/21/2016  . MAMMOGRAM  12/10/2016  . PAP SMEAR  08/17/2017  . PNEUMOCOCCAL POLYSACCHARIDE VACCINE (2) 06/02/2020  . TETANUS/TDAP  10/19/2025  . INFLUENZA VACCINE  Completed   Health Maintenance Review LIFESTYLE:  Exercise:   Tobacco/ETS: Alcohol:  Sugar beverages: Sleep: Drug use: no HH of  Work:    ROS:  GEN/ HEENT: No fever, significant weight changes sweats headaches vision problems hearing changes, CV/ PULM; No chest pain shortness of breath cough, syncope,edema  change in exercise tolerance. GI /GU: No adominal pain, vomiting, change in bowel habits. No blood in the stool. No significant GU symptoms. SKIN/HEME: ,no acute skin rashes suspicious lesions or bleeding. No lymphadenopathy, nodules, masses.  NEURO/ PSYCH:  No neurologic signs such as weakness numbness. No depression anxiety. IMM/ Allergy: No unusual infections.  Allergy .   REST of 12 system review negative except as per HPI   Past Medical History:  Diagnosis Date  . Allergic rhinitis   . Allergy   . Anxiety   . Chest pain    Hx, no problems as of 08/10/15  . Depression   . Diabetes mellitus    Type II  . Dysmenorrhea   . Fatigue    Hx, no current problems as of 08/10/15  . Fibroids   . GERD (gastroesophageal reflux disease)    diet controlled, no meds  . Hyperlipidemia   . Hypertension   . IBS (irritable bowel syndrome)    no meds, diet controlled  . Impaired fasting glucose   . Knee pain, bilateral    Hx, no current problems as of 08/10/15  . Leukoplakia oral mucosa    2011  . Menorrhagia   . OSA (obstructive sleep apnea)    does not use cpap, never went to get  machine  . Other dysfunctions of sleep stages or arousal from sleep   . Sleep apnea    Does not use CPAP  . Snoring    had a sleep study mild osa 2009    Past Surgical History:  Procedure Laterality Date  . BREAST LUMPECTOMY WITH RADIOACTIVE SEED LOCALIZATION Right 03/05/2015   Procedure: RIGHT BREAST LUMPECTOMY WITH RADIOACTIVE SEED LOCALIZATION;  Surgeon: Erroll Luna, MD;  Location: [facility];  Service: General;  Laterality: Right;  . CESAREAN SECTION  1997  . COLONOSCOPY  2007   [facility] - polyp  . DILATION AND CURETTAGE OF UTERUS    . ECTOPIC PREGNANCY SURGERY  2000   laparoscopy  . oral biopsy      Family History  Problem Relation Age of Onset  . Lung cancer Mother   . Cancer Mother     lung   . Prostate cancer Father   . Cancer Father     prostate  . Diabetes Father   . Hyperlipidemia Father   . Hypertension Father   . Asthma Father   . Cancer Maternal Aunt     breast  . Cancer Maternal Aunt     breast  . Cancer Maternal Aunt     breast  . Colon polyps Neg Hx   .  Esophageal cancer Neg Hx   . Rectal cancer Neg Hx   . Stomach cancer Neg Hx     Social History   Social History  . Marital status: Married    Spouse name: N/A  . Number of children: N/A  . Years of education: N/A   Social History Main Topics  . Smoking status: Never Smoker  . Smokeless tobacco: Never Used  . Alcohol use Yes     Comment: social wine  . Drug use: No  . Sexual activity: Not Currently    Birth control/ protection: IUD     Comment: mirena - inserted in 2015   Other Topics Concern  . 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 acetaminophen (TYLENOL) 325 MG tablet Take 2 tablets (650 mg total) by mouth every 8 (eight) hours as needed  for pain (headache). 100 tablet 2  . aspirin 81 MG EC tablet TAKE 1 TABLET BY MOUTH EVERY DAY 100 tablet 0  . atorvastatin (LIPITOR) 20 MG tablet Take 1 tablet (20 mg total) by mouth daily. 90 tablet 2  . Biotin 1000 MCG tablet Take 1,000 mcg by mouth 3 (three) times daily.    Marland Kitchen buPROPion (WELLBUTRIN XL) 300 MG 24 hr tablet Take 1 tablet (300 mg total) by mouth daily. 30 tablet 5  . cetirizine (ZYRTEC) 10 MG tablet TAKE 1 TABLET BY MOUTH DAILY 30 tablet 11  . fluticasone (FLONASE) 50 MCG/ACT nasal spray 2 spray each nostril qd 16 g 11  . JARDIANCE 10 MG TABS tablet TAKE 1 TABLET BY MOUTH EVERY DAY 30 tablet 1  . levonorgestrel (MIRENA) 20 MCG/24HR IUD 1 each by Intrauterine route once.      . Multiple Vitamin (MULTI-VITAMIN PO) Take by mouth.    . Multiple Vitamins-Minerals (EYE VITAMINS PO) Take by mouth.    . Multiple Vitamins-Minerals (HAIR SKIN AND NAILS FORMULA PO) Take by mouth.    Rolene Arbour BOWEL PREP SOLN Take 1 kit by mouth once. Brand Name Only.  No Substitutions.  Suprep Bowel Kit. 177 mL 0  . telmisartan-hydrochlorothiazide (MICARDIS HCT) 80-12.5 MG tablet Take 1 tablet by mouth  daily 90 tablet 0  . traMADol (ULTRAM) 50 MG tablet Take 1 tablet (50 mg total) by mouth every 8 (eight) hours as needed. 30 tablet 0   No facility-administered medications prior to visit.      EXAM:  There were no vitals taken for this visit.  There is no height or weight on file to calculate BMI. Wt Readings from Last 3 Encounters:  01/21/16 227 lb 9.6 oz (103.2 kg)  10/20/15 235 lb 6.4 oz (106.8 kg)  08/10/15 233 lb 6.4 oz (105.9 kg)    Physical Exam: Vital signs reviewed CXJ:BLRV is a well-developed well-nourished alert cooperative    who appearsr stated age in no acute distress.  HEENT: normocephalic atraumatic , Eyes: PERRL EOM's full, conjunctiva clear, Nares: paten,t no deformity discharge or tenderness., Ears: no deformity EAC's clear TMs with normal landmarks. Mouth: clear OP, no  lesions, edema.  Moist mucous membranes. Dentition in adequate repair. NECK: supple without masses, thyromegaly or bruits. CHEST/PULM:  Clear to auscultation and percussion breath sounds equal no wheeze , rales or rhonchi.  No chest wall deformities or tenderness. Breast: normal by inspection . No dimpling, discharge, masses, tenderness or discharge . CV: PMI is nondisplaced, S1 S2 no gallops, murmurs, rubs. Peripheral pulses are full without delay.No JVD .  ABDOMEN: Bowel sounds normal nontender  No guard or rebound, no hepato splenomegal no CVA tenderness.  No hernia. Extremtities:  No clubbing cyanosis or edema, no acute joint swelling or redness no focal atrophy NEURO:  Oriented x3, cranial nerves 3-12 appear to be intact, no obvious focal weakness,gait within normal limits no abnormal reflexes or asymmetrical SKIN: No acute rashes normal turgor, color, no bruising or petechiae. PSYCH: Oriented, good eye contact, no obvious depression anxiety, cognition and judgment appear normal. LN: no cervical axillary inguinal adenopathy  Lab Results  Component Value Date   WBC 6.4 10/12/2015   HGB 12.5 10/12/2015   HCT 37.3 10/12/2015   PLT 419.0 (H) 10/12/2015   GLUCOSE 124 (H) 05/22/2015   CHOL 202 (H) 10/12/2015   TRIG 86.0 10/12/2015   HDL 41.70 10/12/2015   LDLDIRECT 162.5 03/07/2013   LDLCALC 143 (H) 10/12/2015   ALT 12 05/22/2015   AST 15 05/22/2015   NA 143 05/22/2015   K 4.0 05/22/2015   CL 104 05/22/2015   CREATININE 1.00 05/22/2015   BUN 12 05/22/2015   CO2 30 05/22/2015   TSH 1.03 05/22/2015   HGBA1C 6.7 01/21/2016   MICROALBUR 1.2 02/05/2009    BP Readings from Last 3 Encounters:  01/21/16 110/76  10/20/15 126/90  06/03/15 134/86    Lab results reviewed with patient   ASSESSMENT AND PLAN:  Discussed the following assessment and plan:  No diagnosis found.  Patient Care Team: Burnis Medin, MD as PCP - General Kathee Delton, MD (Pulmonary Disease) Earnstine Regal, PA-C as Physician Assistant (Obstetrics and Gynecology) There are no Patient Instructions on file for this visit.  Standley Brooking. Christoper Bushey M.D.

## 2016-06-14 ENCOUNTER — Encounter: Payer: Commercial Managed Care - HMO | Admitting: Internal Medicine

## 2016-07-05 ENCOUNTER — Other Ambulatory Visit: Payer: Self-pay | Admitting: Family Medicine

## 2016-07-05 DIAGNOSIS — R5383 Other fatigue: Secondary | ICD-10-CM

## 2016-07-05 DIAGNOSIS — E876 Hypokalemia: Secondary | ICD-10-CM

## 2016-07-05 DIAGNOSIS — R7989 Other specified abnormal findings of blood chemistry: Secondary | ICD-10-CM

## 2016-07-05 DIAGNOSIS — E119 Type 2 diabetes mellitus without complications: Secondary | ICD-10-CM

## 2016-07-05 DIAGNOSIS — I10 Essential (primary) hypertension: Secondary | ICD-10-CM

## 2016-07-05 DIAGNOSIS — E785 Hyperlipidemia, unspecified: Secondary | ICD-10-CM

## 2016-07-08 ENCOUNTER — Other Ambulatory Visit (INDEPENDENT_AMBULATORY_CARE_PROVIDER_SITE_OTHER): Payer: Commercial Managed Care - HMO

## 2016-07-08 DIAGNOSIS — E876 Hypokalemia: Secondary | ICD-10-CM | POA: Diagnosis not present

## 2016-07-08 DIAGNOSIS — E119 Type 2 diabetes mellitus without complications: Secondary | ICD-10-CM | POA: Diagnosis not present

## 2016-07-08 DIAGNOSIS — D473 Essential (hemorrhagic) thrombocythemia: Secondary | ICD-10-CM

## 2016-07-08 DIAGNOSIS — I10 Essential (primary) hypertension: Secondary | ICD-10-CM

## 2016-07-08 DIAGNOSIS — E785 Hyperlipidemia, unspecified: Secondary | ICD-10-CM

## 2016-07-08 DIAGNOSIS — R5383 Other fatigue: Secondary | ICD-10-CM

## 2016-07-08 DIAGNOSIS — R7989 Other specified abnormal findings of blood chemistry: Secondary | ICD-10-CM

## 2016-07-08 LAB — BASIC METABOLIC PANEL WITH GFR
BUN: 14 mg/dL (ref 6–23)
CO2: 31 meq/L (ref 19–32)
Calcium: 9.7 mg/dL (ref 8.4–10.5)
Chloride: 104 meq/L (ref 96–112)
Creatinine, Ser: 1.02 mg/dL (ref 0.40–1.20)
GFR: 72.96 mL/min
Glucose, Bld: 120 mg/dL — ABNORMAL HIGH (ref 70–99)
Potassium: 4 meq/L (ref 3.5–5.1)
Sodium: 141 meq/L (ref 135–145)

## 2016-07-08 LAB — HEPATIC FUNCTION PANEL
ALT: 10 U/L (ref 0–35)
AST: 12 U/L (ref 0–37)
Albumin: 4.1 g/dL (ref 3.5–5.2)
Alkaline Phosphatase: 79 U/L (ref 39–117)
BILIRUBIN DIRECT: 0.1 mg/dL (ref 0.0–0.3)
BILIRUBIN TOTAL: 0.3 mg/dL (ref 0.2–1.2)
TOTAL PROTEIN: 7.3 g/dL (ref 6.0–8.3)

## 2016-07-08 LAB — LIPID PANEL
CHOL/HDL RATIO: 4
Cholesterol: 178 mg/dL (ref 0–200)
HDL: 43.9 mg/dL (ref >39.00)
LDL CALC: 117 mg/dL — AB (ref 0–99)
NONHDL: 134.25
TRIGLYCERIDES: 84 mg/dL (ref 0.0–149.0)
VLDL: 16.8 mg/dL (ref 0.0–40.0)

## 2016-07-08 LAB — CBC WITH DIFFERENTIAL/PLATELET
BASOS PCT: 0.5 % (ref 0.0–3.0)
Basophils Absolute: 0 10*3/uL (ref 0.0–0.1)
EOS ABS: 0.3 10*3/uL (ref 0.0–0.7)
EOS PCT: 4.8 % (ref 0.0–5.0)
HEMATOCRIT: 38.1 % (ref 36.0–46.0)
HEMOGLOBIN: 12.5 g/dL (ref 12.0–15.0)
LYMPHS PCT: 31.1 % (ref 12.0–46.0)
Lymphs Abs: 1.9 10*3/uL (ref 0.7–4.0)
MCHC: 32.8 g/dL (ref 30.0–36.0)
MCV: 86.7 fl (ref 78.0–100.0)
Monocytes Absolute: 0.5 10*3/uL (ref 0.1–1.0)
Monocytes Relative: 7.3 % (ref 3.0–12.0)
NEUTROS ABS: 3.5 10*3/uL (ref 1.4–7.7)
Neutrophils Relative %: 56.3 % (ref 43.0–77.0)
PLATELETS: 430 10*3/uL — AB (ref 150.0–400.0)
RBC: 4.4 Mil/uL (ref 3.87–5.11)
RDW: 14.9 % (ref 11.5–15.5)
WBC: 6.2 10*3/uL (ref 4.0–10.5)

## 2016-07-08 LAB — HEMOGLOBIN A1C: Hgb A1c MFr Bld: 6.7 % — ABNORMAL HIGH (ref 4.6–6.5)

## 2016-07-08 LAB — TSH: TSH: 1.41 u[IU]/mL (ref 0.35–4.50)

## 2016-07-19 NOTE — Progress Notes (Signed)
Chief Complaint  Patient presents with  . Annual Exam    HPI: Patient  Crystal Townsend  51 y.o. comes in today for Preventive Health Care visit  And Chronic disease management   Father  Terminal with metastatic prostate cancer and helping care driving to [place] a lot   Doing ok mood wise   wellbutrin seems to help  BG up som at times ocass 80 low  But ok otherwise still some sweet tea  And OJ   bp taking meds 5 days a week  Feels should take a rest  On sat sun  No physical reason.  No se of med    Health Maintenance  Topic Date Due  . COLONOSCOPY  08/06/2015  . OPHTHALMOLOGY EXAM  01/28/2016  . FOOT EXAM  06/02/2016  . Hepatitis C Screening  07/20/2017 (Originally Apr 11, 1965)  . HIV Screening  07/20/2017 (Originally 09/15/1978)  . INFLUENZA VACCINE  11/02/2016  . MAMMOGRAM  12/10/2016  . HEMOGLOBIN A1C  01/07/2017  . PAP SMEAR  08/17/2017  . PNEUMOCOCCAL POLYSACCHARIDE VACCINE (2) 06/02/2020  . TETANUS/TDAP  10/19/2025   Health Maintenance Review LIFESTYLE:  Exercise:   Not a lot of time walkin g Tobacco/ETS:n Alcohol: n Sugar beverages: some sweet tea cutting back and OJ  Sleep: 7  Drug use: no HH of  Work: graduiated     ROS:  GEN/ HEENT: No fever, significant weight changes sweats headaches vision problems hearing changes, CV/ PULM; No chest pain shortness of breath cough, syncope,edema  change in exercise tolerance. GI /GU: No adominal pain, vomiting, change in bowel habits. No blood in the stool. No significant GU symptoms. SKIN/HEME: ,no acute skin rashes suspicious lesions or bleeding. No lymphadenopathy, nodules, masses.  NEURO/ PSYCH:  No neurologic signs such as weakness numbness. No depression anxiety. IMM/ Allergy: No unusual infections.  Allergy .   REST of 12 system review negative except as per HPI   Past Medical History:  Diagnosis Date  . Allergic rhinitis   . Allergy   . Anxiety   . Chest pain    Hx, no problems as of 08/10/15  .  Depression   . Diabetes mellitus    Type II  . Dysmenorrhea   . Fatigue    Hx, no current problems as of 08/10/15  . Fibroids   . GERD (gastroesophageal reflux disease)    diet controlled, no meds  . Hyperlipidemia   . Hypertension   . IBS (irritable bowel syndrome)    no meds, diet controlled  . Impaired fasting glucose   . Knee pain, bilateral    Hx, no current problems as of 08/10/15  . Leukoplakia oral mucosa    2011  . Menorrhagia   . OSA (obstructive sleep apnea)    does not use cpap, never went to get machine  . Other dysfunctions of sleep stages or arousal from sleep   . Sleep apnea    Does not use CPAP  . Snoring    had a sleep study mild osa 2009    Past Surgical History:  Procedure Laterality Date  . BREAST LUMPECTOMY WITH RADIOACTIVE SEED LOCALIZATION Right 03/05/2015   Procedure: RIGHT BREAST LUMPECTOMY WITH RADIOACTIVE SEED LOCALIZATION;  Surgeon: Erroll Luna, MD;  Location: [facility];  Service: General;  Laterality: Right;  . CESAREAN SECTION  1997  . COLONOSCOPY  2007   [facility] - polyp  . DILATION AND CURETTAGE OF UTERUS    . ECTOPIC PREGNANCY SURGERY  2000   laparoscopy  . oral biopsy      Family History  Problem Relation Age of Onset  . Lung cancer Mother   . Cancer Mother     lung   . Prostate cancer Father   . Cancer Father     prostate  . Diabetes Father   . Hyperlipidemia Father   . Hypertension Father   . Asthma Father   . Cancer Maternal Aunt     breast  . Cancer Maternal Aunt     breast  . Cancer Maternal Aunt     breast  . Colon polyps Neg Hx   . Esophageal cancer Neg Hx   . Rectal cancer Neg Hx   . Stomach cancer Neg Hx     Social History   Social History  . Marital status: Married    Spouse name: N/A  . Number of children: N/A  . Years of education: N/A   Social History Main Topics  . Smoking status: Never Smoker  . Smokeless tobacco: Never Used  . Alcohol use Yes     Comment: social wine  . Drug  use: No  . Sexual activity: Not Currently    Birth control/ protection: IUD     Comment: mirena - inserted in 2015   Other Topics Concern  . 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 acetaminophen (TYLENOL) 325 MG tablet Take 2 tablets (650 mg total) by mouth every 8 (eight) hours as needed for pain (headache). 100 tablet 2  . aspirin 81 MG EC tablet TAKE 1 TABLET BY MOUTH EVERY DAY 100 tablet 0  . atorvastatin (LIPITOR) 20 MG tablet Take 1 tablet (20 mg total) by mouth daily. 90 tablet 2  . Biotin 1000 MCG tablet Take 1,000 mcg by mouth 3 (three) times daily.    . fluticasone (FLONASE) 50 MCG/ACT nasal spray 2 spray each nostril qd 16 g 11  . levonorgestrel (MIRENA) 20 MCG/24HR IUD 1 each by Intrauterine route once.      . Multiple Vitamin (MULTI-VITAMIN PO) Take by mouth.    . Multiple Vitamins-Minerals (EYE VITAMINS PO) Take by mouth.    . Multiple Vitamins-Minerals (HAIR SKIN AND NAILS FORMULA PO) Take by mouth.    Manus Gunning BOWEL PREP SOLN Take 1 kit by mouth once. Brand Name Only.  No Substitutions.  Suprep Bowel Kit. 177 mL 0  . traMADol (ULTRAM) 50 MG tablet Take 1 tablet (50 mg total) by mouth every 8 (eight) hours as needed. 30 tablet 0  . buPROPion (WELLBUTRIN XL) 300 MG 24 hr tablet Take 1 tablet (300 mg total) by mouth daily. 30 tablet 5  . cetirizine (ZYRTEC) 10 MG tablet TAKE 1 TABLET BY MOUTH DAILY 30 tablet 11  . JARDIANCE 10 MG TABS tablet TAKE 1 TABLET BY MOUTH EVERY DAY 30 tablet 1  . telmisartan-hydrochlorothiazide (MICARDIS HCT) 80-12.5 MG tablet Take 1 tablet by mouth  daily 90 tablet 0   No facility-administered medications prior to visit.      EXAM:  BP 100/70 (BP Location: Right Arm, Patient Position: Sitting, Cuff Size: Large)   Pulse 74    Temp 97.8 F (36.6 C) (Oral)   Ht _0  (1.702 m)  Wt 233 lb (105.7 kg)   BMI 36.49 kg/m   Body mass index is 36.49 kg/m. Wt Readings from Last 3 Encounters:  07/20/16 233 lb (105.7 kg)  01/21/16 227 lb 9.6 oz (103.2 kg)  10/20/15 235 lb 6.4 oz (106.8 kg)    Physical Exam: Vital signs reviewed UEK:CMKL is a well-developed well-nourished alert cooperative    who appearsr stated age in no acute distress.  HEENT: normocephalic atraumatic , Eyes: PERRL EOM's full, conjunctiva clear, Nares: paten,t no deformity discharge or tenderness., Ears: no deformity EAC's clear TMs with normal landmarks. Mouth: clear OP, no lesions, edema.  Moist mucous membranes. Dentition in adequate repair. NECK: supple without masses, thyromegaly palpable  or bruits. CHEST/PULM:  Clear to auscultation and percussion breath sounds equal no wheeze , rales or rhonchi. No chest wall deformities or tenderness. Breast: normal by inspection . No dimpling, discharge, masses, tenderness or discharge . CV: PMI is nondisplaced, S1 S2 no gallops, murmurs, rubs. Peripheral pulses are full without delay.No JVD .  ABDOMEN: Bowel sounds normal nontender  No guard or rebound, no hepato splenomegal no CVA tenderness.  No hernia. Extremtities:  No clubbing cyanosis or edema, no acute joint swelling or redness no focal atrophy NEURO:  Oriented x3, cranial nerves 3-12 appear to be intact, no obvious focal weakness,gait within normal limits no abnormal reflexes or asymmetrical SKIN: No acute rashes normal turgor, color, no bruising or petechiae. PSYCH: Oriented, good eye contact, no obvious depression anxiety, cognition and judgment appear normal. LN: no cervical axillary inguinal adenopathy Diabetic Foot Exam - Simple   Simple Foot Form Diabetic Foot exam was performed with the following findings:  Yes 07/20/2016 10:29 AM  Visual Inspection No deformities, no ulcerations, no other skin breakdown bilaterally:  Yes See comments:   Yes Sensation Testing Intact to touch and monofilament testing bilaterally:  Yes Pulse Check Posterior Tibialis and Dorsalis pulse intact bilaterally:  Yes Comments Toe nail great some curving and onycholysis left ( plans on seeing podiatrist )     Lab Results  Component Value Date   WBC 6.2 07/08/2016   HGB 12.5 07/08/2016   HCT 38.1 07/08/2016   PLT 430.0 (H) 07/08/2016   GLUCOSE 120 (H) 07/08/2016   CHOL 178 07/08/2016   TRIG 84.0 07/08/2016   HDL 43.90 07/08/2016   LDLDIRECT 162.5 03/07/2013   LDLCALC 117 (H) 07/08/2016   ALT 10 07/08/2016   AST 12 07/08/2016   NA 141 07/08/2016   K 4.0 07/08/2016   CL 104 07/08/2016   CREATININE 1.02 07/08/2016   BUN 14 07/08/2016   CO2 31 07/08/2016   TSH 1.41 07/08/2016   HGBA1C 6.7 (H) 07/08/2016   MICROALBUR 1.2 02/05/2009    BP Readings from Last 3 Encounters:  07/20/16 100/70  01/21/16 110/76  10/20/15 126/90    Lab results reviewed with patient   ASSESSMENT AND PLAN:  Discussed the following assessment and plan:  Visit for preventive health examination  Type 2 diabetes mellitus without complication, without long-term current use of insulin ([place]) - dsic control nix  limit sweet beverages stay ion meds   Essential hypertension - controlled refill me  Medication management - stay on wellbutrin   helping at this time  Hyperlipidemia, unspecified hyperlipidemia type  Elevated platelet count - the same follow   Stress due to illness of family member - coping  Disc adherence and at least take med 6 days per week and prefer 7 days  Esp bp medication  what ever controls metabolic and parameters  6 mos  And lab at visit as needed  Patient Care Team: Burnis Medin, MD as PCP - General Kathee Delton, MD (Pulmonary Disease) Earnstine Regal, PA-C as Physician Assistant (Obstetrics and Gynecology) Patient Instructions     Continue lifestyle intervention healthy eating and exercise . Diabetes in control Stop the  sugar in tea .small amount of Oj can be ok .  wwill send in  Refills by end of day of your meds  Lipid   Could be better   Wt Readings from Last 3 Encounters:  01/21/16 227 lb 9.6 oz (103.2 kg)  10/20/15 235 lb 6.4 oz (106.8 kg)  08/10/15 233 lb 6.4 oz (105.9 kg)   BP Readings from Last 3 Encounters:  01/21/16 110/76  10/20/15 126/90  06/03/15 134/86      Wanda K. Panosh M.D.

## 2016-07-20 ENCOUNTER — Encounter: Payer: Self-pay | Admitting: Internal Medicine

## 2016-07-20 ENCOUNTER — Ambulatory Visit (INDEPENDENT_AMBULATORY_CARE_PROVIDER_SITE_OTHER): Payer: Commercial Managed Care - HMO | Admitting: Internal Medicine

## 2016-07-20 VITALS — BP 100/70 | HR 74 | Temp 97.8°F | Ht 67.0 in | Wt 233.0 lb

## 2016-07-20 DIAGNOSIS — Z0000 Encounter for general adult medical examination without abnormal findings: Secondary | ICD-10-CM

## 2016-07-20 DIAGNOSIS — Z6379 Other stressful life events affecting family and household: Secondary | ICD-10-CM

## 2016-07-20 DIAGNOSIS — I10 Essential (primary) hypertension: Secondary | ICD-10-CM

## 2016-07-20 DIAGNOSIS — R7989 Other specified abnormal findings of blood chemistry: Secondary | ICD-10-CM | POA: Diagnosis not present

## 2016-07-20 DIAGNOSIS — Z79899 Other long term (current) drug therapy: Secondary | ICD-10-CM | POA: Diagnosis not present

## 2016-07-20 DIAGNOSIS — E119 Type 2 diabetes mellitus without complications: Secondary | ICD-10-CM | POA: Diagnosis not present

## 2016-07-20 DIAGNOSIS — E785 Hyperlipidemia, unspecified: Secondary | ICD-10-CM

## 2016-07-20 MED ORDER — EMPAGLIFLOZIN 10 MG PO TABS: 10.0000 mg | ORAL_TABLET | Freq: Every day | ORAL | 3 refills | Status: DC

## 2016-07-20 MED ORDER — CETIRIZINE HCL 10 MG PO TABS: ORAL_TABLET | ORAL | 11 refills | Status: DC

## 2016-07-20 MED ORDER — TELMISARTAN-HCTZ 80-12.5 MG PO TABS: 1.0000 | ORAL_TABLET | Freq: Every day | ORAL | 3 refills | Status: DC

## 2016-07-20 MED ORDER — BUPROPION HCL ER (XL) 300 MG PO TB24: 300.0000 mg | ORAL_TABLET | Freq: Every day | ORAL | 3 refills | Status: DC

## 2016-07-20 NOTE — Patient Instructions (Addendum)
   Continue lifestyle intervention healthy eating and exercise . Diabetes in control Stop the sugar in tea .small amount of Oj can be ok .  wwill send in  Refills by end of day of your meds  Lipid   Could be better   Wt Readings from Last 3 Encounters:  01/21/16 227 lb 9.6 oz (103.2 kg)  10/20/15 235 lb 6.4 oz (106.8 kg)  08/10/15 233 lb 6.4 oz (105.9 kg)   BP Readings from Last 3 Encounters:  01/21/16 110/76  10/20/15 126/90  06/03/15 134/86

## 2016-09-02 ENCOUNTER — Encounter: Payer: Self-pay | Admitting: Family Medicine

## 2016-09-02 ENCOUNTER — Ambulatory Visit (INDEPENDENT_AMBULATORY_CARE_PROVIDER_SITE_OTHER)
Admission: RE | Admit: 2016-09-02 | Discharge: 2016-09-02 | Disposition: A | Discharge status: Home / Self Care | Payer: Commercial Managed Care - HMO | Source: Ambulatory Visit | Attending: Family Medicine | Admitting: Family Medicine

## 2016-09-02 ENCOUNTER — Encounter: Payer: Self-pay | Admitting: *Deleted

## 2016-09-02 ENCOUNTER — Ambulatory Visit (INDEPENDENT_AMBULATORY_CARE_PROVIDER_SITE_OTHER): Payer: Commercial Managed Care - HMO | Admitting: Family Medicine

## 2016-09-02 VITALS — BP 122/82 | HR 91 | Temp 98.5°F | Ht 67.0 in | Wt 231.3 lb

## 2016-09-02 DIAGNOSIS — R059 Cough, unspecified: Secondary | ICD-10-CM

## 2016-09-02 DIAGNOSIS — B9789 Other viral agents as the cause of diseases classified elsewhere: Secondary | ICD-10-CM

## 2016-09-02 DIAGNOSIS — R05 Cough: Secondary | ICD-10-CM

## 2016-09-02 DIAGNOSIS — R0989 Other specified symptoms and signs involving the circulatory and respiratory systems: Secondary | ICD-10-CM | POA: Diagnosis not present

## 2016-09-02 DIAGNOSIS — J988 Other specified respiratory disorders: Secondary | ICD-10-CM

## 2016-09-02 LAB — POC INFLUENZA A&B (BINAX/QUICKVUE)
INFLUENZA A, POC: NEGATIVE
Influenza B, POC: NEGATIVE

## 2016-09-02 MED ORDER — BENZONATATE 100 MG PO CAPS: 100.0000 mg | ORAL_CAPSULE | Freq: Three times a day (TID) | ORAL | 0 refills | Status: DC | PRN

## 2016-09-02 NOTE — Addendum Note (Signed)
Addended by: Agnes Lawrence on: 09/02/2016 09:41 AM   Modules accepted: Orders

## 2016-09-02 NOTE — Patient Instructions (Signed)
BEFORE YOU LEAVE: -xray sheet -rapid flu test  I hope you are feeling better soon.  INSTRUCTIONS FOR UPPER RESPIRATORY INFECTION:  -nasal saline wash 2-3 times daily (use prepackaged nasal saline or bottled/distilled water if making your own)   -can use AFRIN nasal spray for drainage and nasal congestion - but do NOT use longer then 3-4 days  -can use tylenol (in no history of liver disease) or ibuprofen (if no history of kidney disease, bowel bleeding or significant heart disease) as directed for aches and sorethroat  -if you are taking a cough medication - use only as directed, may also try a teaspoon of honey to coat the throat and throat lozenges. Sent tessalon to use for cough if needed.  -for sore throat, salt water gargles can help  -follow up if you have fevers, facial pain, tooth pain, difficulty breathing or are worsening or symptoms persist longer then expected  Upper Respiratory Infection, Adult An upper respiratory infection (URI) is also known as the common cold. It is often caused by a type of germ (virus). Colds are easily spread (contagious). You can pass it to others by kissing, coughing, sneezing, or drinking out of the same glass. Usually, you get better in 1 to 3  weeks.  However, the cough can last for even longer. HOME CARE   Only take medicine as told by your doctor. Follow instructions provided above.  Drink enough water and fluids to keep your pee (urine) clear or pale yellow.  Get plenty of rest.  Return to work when your temperature is < 100 for 24 hours or as told by your doctor. You may use a face mask and wash your hands to stop your cold from spreading. GET HELP RIGHT AWAY IF:   After the first few days, you feel you are getting worse.  You have questions about your medicine.  You have chills, shortness of breath, or red spit (mucus).  You have pain in the face for more then 1-2 days, especially when you bend forward.  You have a fever, puffy  (swollen) neck, pain when you swallow, or white spots in the back of your throat.  You have a bad headache, ear pain, sinus pain, or chest pain.  You have a high-pitched whistling sound when you breathe in and out (wheezing).  You cough up blood.  You have sore muscles or a stiff neck. MAKE SURE YOU:   Understand these instructions.  Will watch your condition.  Will get help right away if you are not doing well or get worse. Document Released: 09/07/2007 Document Revised: 06/13/2011 Document Reviewed: 06/26/2013 [facility] Patient Information 2015 [place], [place]. This information is not intended to replace advice given to you by your health care provider. Make sure you discuss any questions you have with your health care provider.

## 2016-09-02 NOTE — Progress Notes (Signed)
HPI:  Acute visit for URI: -started: 4 days ago -symptoms:nasal congestion, PND, HA, chills, low grade temp - highest 100.1 at home, cough -denies:fever, SOB, NVD, tooth pain -has tried: OTC cold and cough medications -sick contacts/travel/risks: no reported flu, strep or tick exposure -Hx of: allergies - take zyrtec and flonase for this, well controlled diabetes -has never had a cold last 4 days - she is worried about influenza and pneumonia  ROS: See pertinent positives and negatives per HPI.  Past Medical History:  Diagnosis Date  . Allergic rhinitis   . Allergy   . Anxiety   . Chest pain    Hx, no problems as of 08/10/15  . Depression   . Diabetes mellitus    Type II  . Dysmenorrhea   . Fatigue    Hx, no current problems as of 08/10/15  . Fibroids   . GERD (gastroesophageal reflux disease)    diet controlled, no meds  . Hyperlipidemia   . Hypertension   . IBS (irritable bowel syndrome)    no meds, diet controlled  . Impaired fasting glucose   . Knee pain, bilateral    Hx, no current problems as of 08/10/15  . Leukoplakia oral mucosa    2011  . Menorrhagia   . OSA (obstructive sleep apnea)    does not use cpap, never went to get machine  . Other dysfunctions of sleep stages or arousal from sleep   . Sleep apnea    Does not use CPAP  . Snoring    had a sleep study mild osa 2009    Past Surgical History:  Procedure Laterality Date  . BREAST LUMPECTOMY WITH RADIOACTIVE SEED LOCALIZATION Right 03/05/2015   Procedure: RIGHT BREAST LUMPECTOMY WITH RADIOACTIVE SEED LOCALIZATION;  Surgeon: Erroll Luna, MD;  Location: [facility];  Service: General;  Laterality: Right;  . CESAREAN SECTION  1997  . COLONOSCOPY  2007   [facility] - polyp  . DILATION AND CURETTAGE OF UTERUS    . ECTOPIC PREGNANCY SURGERY  2000   laparoscopy  . oral biopsy      Family History  Problem Relation Age of Onset  . Lung cancer Mother   . Cancer Mother        lung   .  Prostate cancer Father   . Cancer Father        prostate  . Diabetes Father   . Hyperlipidemia Father   . Hypertension Father   . Asthma Father   . Cancer Maternal Aunt        breast  . Cancer Maternal Aunt        breast  . Cancer Maternal Aunt        breast  . Colon polyps Neg Hx   . Esophageal cancer Neg Hx   . Rectal cancer Neg Hx   . Stomach cancer Neg Hx     Social History   Social History  . Marital status: Married    Spouse name: N/A  . Number of children: N/A  . Years of education: N/A   Social History Main Topics  . Smoking status: Never Smoker  . Smokeless tobacco: Never Used  . Alcohol use Yes     Comment: social wine  . Drug use: No  . Sexual activity: Not Currently    Birth control/ protection: IUD     Comment: mirena - inserted in 2015   Other Topics Concern  . 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  acetaminophen (TYLENOL) 325 MG tablet, Take 2 tablets (650 mg total) by mouth every 8 (eight) hours as needed for pain (headache)., Disp: 100 tablet, Rfl: 2 .  aspirin 81 MG EC tablet, TAKE 1 TABLET BY MOUTH EVERY DAY, Disp: 100 tablet, Rfl: 0 .  atorvastatin (LIPITOR) 20 MG tablet, Take 1 tablet (20 mg total) by mouth daily., Disp: 90 tablet, Rfl: 2 .  Biotin 1000 MCG tablet, Take 1,000 mcg by mouth 3 (three) times daily., Disp: , Rfl:  .  buPROPion (WELLBUTRIN XL) 300 MG 24 hr tablet, Take 1 tablet (300 mg total) by mouth daily., Disp: 90 tablet, Rfl: 3 .  cetirizine (ZYRTEC) 10 MG tablet, TAKE 1 TABLET BY MOUTH DAILY, Disp: 30 tablet, Rfl: 11 .  empagliflozin (JARDIANCE) 10 MG TABS tablet, Take 10 mg by mouth daily., Disp: 90 tablet, Rfl: 3 .  fluticasone (FLONASE) 50 MCG/ACT nasal spray, 2 spray each nostril qd, Disp: 16 g, Rfl: 11 .  levonorgestrel (MIRENA) 20  MCG/24HR IUD, 1 each by Intrauterine route once.  , Disp: , Rfl:  .  Multiple Vitamin (MULTI-VITAMIN PO), Take by mouth., Disp: , Rfl:  .  Multiple Vitamins-Minerals (EYE VITAMINS PO), Take by mouth., Disp: , Rfl:  .  Multiple Vitamins-Minerals (HAIR SKIN AND NAILS FORMULA PO), Take by mouth., Disp: , Rfl:  .  SUPREP BOWEL PREP SOLN, Take 1 kit by mouth once. Brand Name Only.  No Substitutions.  Suprep Bowel Kit., Disp: 177 mL, Rfl: 0 .  telmisartan-hydrochlorothiazide (MICARDIS HCT) 80-12.5 MG tablet, Take 1 tablet by mouth daily., Disp: 90 tablet, Rfl: 3 .  traMADol (ULTRAM) 50 MG tablet, Take 1 tablet (50 mg total) by mouth every 8 (eight) hours as needed., Disp: 30 tablet, Rfl: 0 .  benzonatate (TESSALON PERLES) 100 MG capsule, Take 1 capsule (100 mg total) by mouth 3 (three) times daily as needed for cough., Disp: 20 capsule, Rfl: 0  EXAM:  Vitals:   09/02/16 0912  BP: 122/82  Pulse: 91  Temp: 98.5 F (36.9 C)    Body mass index is 36.23 kg/m.  GENERAL: vitals reviewed and listed above, alert, oriented, appears well hydrated and in no acute distress  HEENT: atraumatic, conjunttiva clear, no obvious abnormalities on inspection of external nose and ears, normal appearance of ear canals and TMs, clear nasal congestion, mild post oropharyngeal erythema with PND, no tonsillar edema or exudate, no sinus TTP  NECK: no obvious masses on inspection  LUNGS: clear to auscultation bilaterally, no wheezes, rales or rhonchi, good air movement  CV: HRRR, no peripheral edema  MS: moves all extremities without noticeable abnormality  PSYCH: pleasant and cooperative, no obvious depression or anxiety  ASSESSMENT AND PLAN:  Discussed the following assessment and plan:  Upper respiratory symptom  Viral respiratory illness  Cough - Plan: DG Chest 2 View  -given HPI and exam findings today, a serious infection or illness is unlikely. We discussed potential etiologies, with VURI being most  likely, and offered tessalon for cough, supportive care and monitoring. We discussed treatment side effects, likely course, antibiotic misuse, transmission, and signs of developing a serious illness. -she is very worried she has the flu (unlikely given timing, also tamiflu would not help at this point) or pneumonia  -  she opted to do a rapid flu test and cxr for these concerns -of course, we advised to return or notify a doctor immediately if symptoms worsen or persist or new concerns arise.    Patient Instructions  BEFORE YOU LEAVE: -xray sheet -rapid flu test  I hope you are feeling better soon.  INSTRUCTIONS FOR UPPER RESPIRATORY INFECTION:  -nasal saline wash 2-3 times daily (use prepackaged nasal saline or bottled/distilled water if making your own)   -can use AFRIN nasal spray for drainage and nasal congestion - but do NOT use longer then 3-4 days  -can use tylenol (in no history of liver disease) or ibuprofen (if no history of kidney disease, bowel bleeding or significant heart disease) as directed for aches and sorethroat  -if you are taking a cough medication - use only as directed, may also try a teaspoon of honey to coat the throat and throat lozenges. Sent tessalon to use for cough if needed.  -for sore throat, salt water gargles can help  -follow up if you have fevers, facial pain, tooth pain, difficulty breathing or are worsening or symptoms persist longer then expected  Upper Respiratory Infection, Adult An upper respiratory infection (URI) is also known as the common cold. It is often caused by a type of germ (virus). Colds are easily spread (contagious). You can pass it to others by kissing, coughing, sneezing, or drinking out of the same glass. Usually, you get better in 1 to 3  weeks.  However, the cough can last for even longer. HOME CARE   Only take medicine as told by your doctor. Follow instructions provided above.  Drink enough water and fluids to keep your  pee (urine) clear or pale yellow.  Get plenty of rest.  Return to work when your temperature is < 100 for 24 hours or as told by your doctor. You may use a face mask and wash your hands to stop your cold from spreading. GET HELP RIGHT AWAY IF:   After the first few days, you feel you are getting worse.  You have questions about your medicine.  You have chills, shortness of breath, or red spit (mucus).  You have pain in the face for more then 1-2 days, especially when you bend forward.  You have a fever, puffy (swollen) neck, pain when you swallow, or white spots in the back of your throat.  You have a bad headache, ear pain, sinus pain, or chest pain.  You have a high-pitched whistling sound when you breathe in and out (wheezing).  You cough up blood.  You have sore muscles or a stiff neck. MAKE SURE YOU:   Understand these instructions.  Will watch your condition.  Will get help right away if you are not doing well or get worse. Document Released: 09/07/2007 Document Revised: 06/13/2011 Document Reviewed: 06/26/2013 [facility] Patient Information 2015 [place], [place]. This information is not intended to replace advice given to you by your health care provider. Make sure you discuss any questions you have with your health care provider.    Colin Benton R., DO

## 2016-10-27 DIAGNOSIS — H04123 Dry eye syndrome of bilateral lacrimal glands: Secondary | ICD-10-CM | POA: Diagnosis not present

## 2016-10-27 DIAGNOSIS — E119 Type 2 diabetes mellitus without complications: Secondary | ICD-10-CM | POA: Diagnosis not present

## 2016-10-27 LAB — HM DIABETES EYE EXAM

## 2016-10-31 DIAGNOSIS — B308 Other viral conjunctivitis: Secondary | ICD-10-CM | POA: Diagnosis not present

## 2016-11-01 ENCOUNTER — Encounter: Payer: Self-pay | Admitting: Internal Medicine

## 2016-11-29 DIAGNOSIS — B308 Other viral conjunctivitis: Secondary | ICD-10-CM | POA: Diagnosis not present

## 2016-12-21 ENCOUNTER — Other Ambulatory Visit: Payer: Self-pay | Admitting: Internal Medicine

## 2016-12-23 ENCOUNTER — Encounter: Payer: Self-pay | Admitting: Internal Medicine

## 2016-12-29 ENCOUNTER — Other Ambulatory Visit: Payer: Self-pay | Admitting: Emergency Medicine

## 2016-12-29 MED ORDER — ATORVASTATIN CALCIUM 20 MG PO TABS: 20.0000 mg | ORAL_TABLET | Freq: Every day | ORAL | 1 refills | Status: DC

## 2017-01-19 ENCOUNTER — Ambulatory Visit (INDEPENDENT_AMBULATORY_CARE_PROVIDER_SITE_OTHER): Payer: 59 | Admitting: Internal Medicine

## 2017-01-19 ENCOUNTER — Encounter: Payer: Self-pay | Admitting: Internal Medicine

## 2017-01-19 VITALS — BP 118/84 | HR 75 | Temp 98.1°F | Wt 230.0 lb

## 2017-01-19 DIAGNOSIS — E118 Type 2 diabetes mellitus with unspecified complications: Secondary | ICD-10-CM

## 2017-01-19 DIAGNOSIS — I10 Essential (primary) hypertension: Secondary | ICD-10-CM | POA: Diagnosis not present

## 2017-01-19 DIAGNOSIS — Z79899 Other long term (current) drug therapy: Secondary | ICD-10-CM | POA: Diagnosis not present

## 2017-01-19 DIAGNOSIS — Z23 Encounter for immunization: Secondary | ICD-10-CM | POA: Diagnosis not present

## 2017-01-19 LAB — POCT GLYCOSYLATED HEMOGLOBIN (HGB A1C): HEMOGLOBIN A1C: 6.7

## 2017-01-19 NOTE — Patient Instructions (Addendum)
Continue lifestyle intervention healthy eating and exercise .  6 month CPX and   Full time   Hg a1c is 6.7    Stable   Your kidney function is   normal at last check 6 months ago .

## 2017-01-19 NOTE — Progress Notes (Signed)
Chief Complaint  Patient presents with  . Follow-up    6 mo recheck - A1c    HPI: Crystal Townsend 51 y.o. come in for Chronic disease management  Sugars    130 range recnetly and 2 hours  After 120 after lunch. Does have some decreased appetite but no vomiting is back to trying to eat more mindful.  Father passed in June  No chest pain shortness of breath change in vision neurologic symptoms. Medication list reviewed she is on chart he has could not tolerate the metformin. Asks about kidney function in disease and diabetes and hasn't been checked. HLD taking med   ROS: See pertinent positives and negatives per HPI.  Past Medical History:  Diagnosis Date  . Allergic rhinitis   . Allergy   . Anxiety   . Chest pain    Hx, no problems as of 08/10/15  . Depression   . Diabetes mellitus    Type II  . Dysmenorrhea   . Fatigue    Hx, no current problems as of 08/10/15  . Fibroids   . GERD (gastroesophageal reflux disease)    diet controlled, no meds  . Hyperlipidemia   . Hypertension   . IBS (irritable bowel syndrome)    no meds, diet controlled  . Impaired fasting glucose   . Knee pain, bilateral    Hx, no current problems as of 08/10/15  . Leukoplakia oral mucosa    2011  . Menorrhagia   . OSA (obstructive sleep apnea)    does not use cpap, never went to get machine  . Other dysfunctions of sleep stages or arousal from sleep   . Sleep apnea    Does not use CPAP  . Snoring    had a sleep study mild osa 2009    Family History  Problem Relation Age of Onset  . Lung cancer Mother   . Cancer Mother        lung   . Prostate cancer Father   . Cancer Father        prostate  . Diabetes Father   . Hyperlipidemia Father   . Hypertension Father   . Asthma Father   . Cancer Maternal Aunt        breast  . Cancer Maternal Aunt        breast  . Cancer Maternal Aunt        breast  . Colon polyps Neg Hx   . Esophageal cancer Neg Hx   . Rectal cancer Neg Hx   . Stomach  cancer Neg Hx     Social History   Social History  . Marital status: Married    Spouse name: N/A  . Number of children: N/A  . Years of education: N/A   Social History Main Topics  . Smoking status: Never Smoker  . Smokeless tobacco: Never Used  . Alcohol use Yes     Comment: social wine  . Drug use: No  . Sexual activity: Not Currently    Birth control/ protection: IUD     Comment: mirena - inserted in 2015   Other Topics Concern  . 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 acetaminophen (TYLENOL) 325  MG tablet Take 2 tablets (650 mg total) by mouth every 8 (eight) hours as needed for pain (headache). 100 tablet 2  . aspirin 81 MG EC tablet TAKE 1 TABLET BY MOUTH EVERY DAY 100 tablet 0  . atorvastatin (LIPITOR) 20 MG tablet Take 1 tablet (20 mg total) by mouth daily. 90 tablet 1  . Biotin 1000 MCG tablet Take 1,000 mcg by mouth 3 (three) times daily.    Marland Kitchen buPROPion (WELLBUTRIN XL) 300 MG 24 hr tablet Take 1 tablet (300 mg total) by mouth daily. 90 tablet 3  . cetirizine (ZYRTEC) 10 MG tablet TAKE 1 TABLET BY MOUTH DAILY 30 tablet 11  . empagliflozin (JARDIANCE) 10 MG TABS tablet Take 10 mg by mouth daily. 90 tablet 3  . fluticasone (FLONASE) 50 MCG/ACT nasal spray 2 spray each nostril qd 16 g 11  . levonorgestrel (MIRENA) 20 MCG/24HR IUD 1 each by Intrauterine route once.      . Multiple Vitamin (MULTI-VITAMIN PO) Take by mouth.    . Multiple Vitamins-Minerals (HAIR SKIN AND NAILS FORMULA PO) Take by mouth.    . telmisartan-hydrochlorothiazide (MICARDIS HCT) 80-12.5 MG tablet Take 1 tablet by mouth daily. 90 tablet 3  . benzonatate (TESSALON PERLES) 100 MG capsule Take 1 capsule (100 mg total) by mouth 3 (three) times daily as needed for cough. (Patient not  taking: Reported on 01/19/2017) 20 capsule 0  . Multiple Vitamins-Minerals (EYE VITAMINS PO) Take by mouth.    Manus Gunning BOWEL PREP SOLN Take 1 kit by mouth once. Brand Name Only.  No Substitutions.  Suprep Bowel Kit. (Patient not taking: Reported on 01/19/2017) 177 mL 0  . traMADol (ULTRAM) 50 MG tablet Take 1 tablet (50 mg total) by mouth every 8 (eight) hours as needed. (Patient not taking: Reported on 01/19/2017) 30 tablet 0   No facility-administered medications prior to visit.      EXAM:  BP 118/84 (BP Location: Right Arm, Patient Position: Sitting, Cuff Size: Large)   Pulse 75   Temp 98.1 F (36.7 C) (Oral)   Wt 230 lb (104.3 kg)   BMI 36.02 kg/m   Body mass index is 36.02 kg/m.  GENERAL: vitals reviewed and listed above, alert, oriented, appears well hydrated and in no acute distress HEENT: atraumatic, conjunctiva  clear, no obvious abnormalities on inspection of external nose and earsNECK: no obvious masses on inspection palpation  LUNGS: clear to auscultation bilaterally, no wheezes, rales or rhonchi, good air movement CV: HRRR, no clubbing cyanosis or  peripheral edema nl cap refill  MS: moves all extremities without noticeable focal  abnormality PSYCH: pleasant and cooperative, no obvious depression or anxiety Feet no lesion noted  Lab Results  Component Value Date   WBC 6.2 07/08/2016   HGB 12.5 07/08/2016   HCT 38.1 07/08/2016   PLT 430.0 (H) 07/08/2016   GLUCOSE 120 (H) 07/08/2016   CHOL 178 07/08/2016   TRIG 84.0 07/08/2016   HDL 43.90 07/08/2016   LDLDIRECT 162.5 03/07/2013   LDLCALC 117 (H) 07/08/2016   ALT 10 07/08/2016   AST 12 07/08/2016   NA 141 07/08/2016   K 4.0 07/08/2016   CL 104 07/08/2016   CREATININE 1.02 07/08/2016   BUN 14 07/08/2016   CO2 31 07/08/2016   TSH 1.41 07/08/2016   HGBA1C 6.7 01/19/2017   MICROALBUR 1.2 02/05/2009   BP Readings from Last 3 Encounters:  01/19/17 118/84  09/02/16 122/82  07/20/16 100/70   Wt Readings  from Last 3 Encounters:  01/19/17 230 lb (104.3 kg)  09/02/16 231 lb 4.8 oz (104.9 kg)  07/20/16 233 lb (105.7 kg)     ASSESSMENT AND PLAN:  Discussed the following assessment and plan:  Medication management  Type 2 diabetes mellitus with complication, without long-term current use of insulin (HCC) - Plan: POC HgB A1c  Essential hypertension  Need for influenza vaccination - Plan: Flu Vaccine QUAD 6+ mos PF IM (Fluarix Quad PF) Reviewed risk of renal disease and diabetes hypertension and interventions to prevent such as blood pressure control and using an ace or are. Also avoiding renal toxins. At last check her renal function was normal. Continue diabetes control dietary and medication as well as blood pressure control. CPX in about 6 months. She can call ahead if she wants to have blood work done before the visit and set about the visit and I will put in these orders. Flu vaccine and let us know in the meantime any concerns.  Total visit 90mns > 50% spent counseling and coordinating care as indicated in above note and in instructions to patient .   -Patient advised to return or notify health care team  if  new concerns arise.  Patient Instructions  Continue lifestyle intervention healthy eating and exercise .  6 month CPX and   Full time   Hg a1c is 6.7    Stable   Your kidney function is   normal at last check 6 months ago .          WStandley Brooking Kyiah Canepa M.D.

## 2017-03-07 DIAGNOSIS — E119 Type 2 diabetes mellitus without complications: Secondary | ICD-10-CM | POA: Diagnosis not present

## 2017-03-07 DIAGNOSIS — H04123 Dry eye syndrome of bilateral lacrimal glands: Secondary | ICD-10-CM | POA: Diagnosis not present

## 2017-03-07 DIAGNOSIS — H2513 Age-related nuclear cataract, bilateral: Secondary | ICD-10-CM | POA: Diagnosis not present

## 2017-03-07 LAB — HM DIABETES EYE EXAM

## 2017-05-02 ENCOUNTER — Encounter: Payer: Self-pay | Admitting: Internal Medicine

## 2017-05-29 DIAGNOSIS — Z01419 Encounter for gynecological examination (general) (routine) without abnormal findings: Secondary | ICD-10-CM | POA: Diagnosis not present

## 2017-05-29 DIAGNOSIS — Z124 Encounter for screening for malignant neoplasm of cervix: Secondary | ICD-10-CM | POA: Diagnosis not present

## 2017-05-29 DIAGNOSIS — Z6837 Body mass index (BMI) 37.0-37.9, adult: Secondary | ICD-10-CM | POA: Diagnosis not present

## 2017-05-29 DIAGNOSIS — Z6836 Body mass index (BMI) 36.0-36.9, adult: Secondary | ICD-10-CM | POA: Diagnosis not present

## 2017-06-29 ENCOUNTER — Other Ambulatory Visit: Payer: Self-pay | Admitting: Internal Medicine

## 2017-07-04 ENCOUNTER — Ambulatory Visit (INDEPENDENT_AMBULATORY_CARE_PROVIDER_SITE_OTHER): Payer: 59

## 2017-07-04 ENCOUNTER — Encounter: Payer: Self-pay | Admitting: Podiatry

## 2017-07-04 ENCOUNTER — Ambulatory Visit: Payer: 59 | Admitting: Podiatry

## 2017-07-04 VITALS — BP 113/70 | HR 76 | Resp 16

## 2017-07-04 DIAGNOSIS — M722 Plantar fascial fibromatosis: Secondary | ICD-10-CM

## 2017-07-04 DIAGNOSIS — L603 Nail dystrophy: Secondary | ICD-10-CM

## 2017-07-04 DIAGNOSIS — L601 Onycholysis: Secondary | ICD-10-CM | POA: Diagnosis not present

## 2017-07-04 DIAGNOSIS — L608 Other nail disorders: Secondary | ICD-10-CM | POA: Diagnosis not present

## 2017-07-05 NOTE — Progress Notes (Signed)
Subjective:  Patient ID: Crystal Townsend, female    DOB: Dec 08, 1965,  MRN: [id number] HPI Chief Complaint  Patient presents with  . Foot Pain    Arch left - knot, just noticed this weekend, tender with pressure and walking  . Diabetes    Requesting diabetic foot exam - last a1c was 6.5  . New Patient (Initial Visit)    52 y.o. female presents with the above complaint  ROS: Denies fever chills nausea vomiting muscle aches pains calf pain chest pain shortness of breath or headache.  Past Medical History:  Diagnosis Date  . Allergic rhinitis   . Allergy   . Anxiety   . Chest pain    Hx, no problems as of 08/10/15  . Depression   . Diabetes mellitus    Type II  . Dysmenorrhea   . Fatigue    Hx, no current problems as of 08/10/15  . Fibroids   . GERD (gastroesophageal reflux disease)    diet controlled, no meds  . Hyperlipidemia   . Hypertension   . IBS (irritable bowel syndrome)    no meds, diet controlled  . Impaired fasting glucose   . Knee pain, bilateral    Hx, no current problems as of 08/10/15  . Leukoplakia oral mucosa    2011  . Menorrhagia   . OSA (obstructive sleep apnea)    does not use cpap, never went to get machine  . Other dysfunctions of sleep stages or arousal from sleep   . Sleep apnea    Does not use CPAP  . Snoring    had a sleep study mild osa 2009   Past Surgical History:  Procedure Laterality Date  . BREAST LUMPECTOMY WITH RADIOACTIVE SEED LOCALIZATION Right 03/05/2015   Procedure: RIGHT BREAST LUMPECTOMY WITH RADIOACTIVE SEED LOCALIZATION;  Surgeon: Erroll Luna, MD;  Location: [facility];  Service: General;  Laterality: Right;  . CESAREAN SECTION  1997  . COLONOSCOPY  2007   [facility] - polyp  . DILATION AND CURETTAGE OF UTERUS    . ECTOPIC PREGNANCY SURGERY  2000   laparoscopy  . oral biopsy      Current Outpatient Medications:  .  acetaminophen (TYLENOL) 325 MG tablet, Take 2 tablets (650 mg total) by mouth every 8 (eight)  hours as needed for pain (headache)., Disp: 100 tablet, Rfl: 2 .  aspirin 81 MG EC tablet, TAKE 1 TABLET BY MOUTH EVERY DAY, Disp: 100 tablet, Rfl: 0 .  atorvastatin (LIPITOR) 20 MG tablet, Take 1 tablet (20 mg total) by mouth daily., Disp: 90 tablet, Rfl: 1 .  Biotin 1000 MCG tablet, Take 1,000 mcg by mouth 3 (three) times daily., Disp: , Rfl:  .  buPROPion (WELLBUTRIN XL) 300 MG 24 hr tablet, Take 1 tablet (300 mg total) by mouth daily., Disp: 90 tablet, Rfl: 3 .  cetirizine (ZYRTEC) 10 MG tablet, TAKE 1 TABLET BY MOUTH DAILY, Disp: 30 tablet, Rfl: 11 .  empagliflozin (JARDIANCE) 10 MG TABS tablet, Take 10 mg by mouth daily., Disp: 90 tablet, Rfl: 3 .  fluticasone (FLONASE) 50 MCG/ACT nasal spray, 2 spray each nostril qd, Disp: 16 g, Rfl: 11 .  levonorgestrel (MIRENA) 20 MCG/24HR IUD, 1 each by Intrauterine route once.  , Disp: , Rfl:  .  Multiple Vitamin (MULTI-VITAMIN PO), Take by mouth., Disp: , Rfl:  .  OPTIVE SENSITIVE 0.5-0.9 % SOLN, INT 1 GTT IN OU TID, Disp: , Rfl: 11 .  telmisartan-hydrochlorothiazide (MICARDIS HCT) 80-12.5  MG tablet, TAKE 1 TABLET BY MOUTH  DAILY, Disp: 90 tablet, Rfl: 1  Allergies  Allergen Reactions  . Lisinopril     REACTION: throat epiglottal? swelling ? from ace  . Dr Constance Holster evaluation.  10.09  . Metformin     REACTION: severe nausea with 500 mg  Immediate release   Review of Systems Objective:   Vitals:   07/04/17 1605  BP: 113/70  Pulse: 76  Resp: 16    General: Well developed, nourished, in no acute distress, alert and oriented x3   Dermatological: Skin is warm, dry and supple bilateral. Nails x 10 are well maintained; however her hallux nails are thicker and do appear to have some type of nail dystrophy to the lateral margins.  Remaining integument appears unremarkable at this time. There are no open sores, no preulcerative lesions, no rash or signs of infection present.  Vascular: Dorsalis Pedis artery and Posterior Tibial artery pedal pulses  are 2/4 bilateral with immedate capillary fill time. Pedal hair growth present. No varicosities and no lower extremity edema present bilateral.   Neruologic: Grossly intact via light touch bilateral. Vibratory intact via tuning fork bilateral. Protective threshold with Semmes Wienstein monofilament intact to all pedal sites bilateral. Patellar and Achilles deep tendon reflexes 2+ bilateral. No Babinski or clonus noted bilateral.   Musculoskeletal: No gross boney pedal deformities bilateral. No pain, crepitus, or limitation noted with foot and ankle range of motion bilateral. Muscular strength 5/5 in all groups tested bilateral.  Soft tissue mass to the plantar aspect of the medial band of the plantar fascia measuring less than a centimeter in diameter mildly tender on palpation nonerythematous nonpulsatile more than likely a fibroma. Gait: Unassisted, Nonantalgic.    Radiographs:  Bilateral radiographs taken no acute findings.  No soft tissue lesion visible on radiograph.  No calcification identified.  Assessment & Plan:   Assessment: Diabetes mellitus with early diabetic peripheral neuropathy.  Probable fibroma plantar aspect left foot.  Thickened dystrophic nails  Plan: Discussed etiology pathology conservative versus surgical therapies at this point samples of the nail and skin were taken for pathologic evaluation.  We decided to do nothing to the plantar fibroma today other than to watch it.     Aspen Deterding T. Man, [place]

## 2017-07-17 ENCOUNTER — Telehealth: Payer: Self-pay

## 2017-07-17 NOTE — Telephone Encounter (Signed)
LVM for patient to return phone call regarding negative fungal culture results. Attemped to call cell phone but there was no answer or vm availability

## 2017-07-17 NOTE — Telephone Encounter (Signed)
-----   Message from Garrel Ridgel, [place] sent at 07/12/2017 12:09 PM EDT ----- Negative for fungus.

## 2017-07-18 ENCOUNTER — Telehealth: Payer: Self-pay | Admitting: *Deleted

## 2017-07-18 NOTE — Telephone Encounter (Signed)
Pt requested lab results.

## 2017-07-18 NOTE — Telephone Encounter (Signed)
I informed pt of Dr. Hyatt's review of results. 

## 2017-07-18 NOTE — Telephone Encounter (Signed)
Left message informing pt I could leave a message on her voicemail if she would like me to do so, but she would have to leave me a message allowing for the results to be left.

## 2017-07-26 NOTE — Progress Notes (Signed)
Chief Complaint  Patient presents with  . Annual Exam    Pt reports severe chest pains x 3 days ago, relieved when laying down. Pt attempted going to the ED but parking was horible and she went back home. Pt is still having some pain but is improved, having SOB. Pt not concerned of a heart attack. Pt took Aleve and Tums.     HPI:  Patient  Crystal Townsend  52 y.o. comes in today for Preventive Health Care visit  Chronic disease management and  New  Problem with episode of mid chest pain   As above with some sweating  And nausea but no sob  Syncope ior palpitations    Seems continuous but waxing and waning thought about going to ed . Took  Aleve and then tums ? Didn't help but getting better   Can still feel sx    Initially began when awoke in am    No acute dysphagia but needs to use straw to be able to swallow  Her BP PILL   Has seen  Pod and no coicnerns     Has some tingling at times hands and feet but not permanent   LIPID: no se of med   Needs refill  wellbutrin and antihistamine doing ok   Health Maintenance  Topic Date Due  . Hepatitis C Screening  12-25-1965  . HIV Screening  09/15/1978  . COLONOSCOPY  08/06/2015  . FOOT EXAM  07/20/2017  . HEMOGLOBIN A1C  07/20/2017  . PAP SMEAR  08/17/2017  . INFLUENZA VACCINE  11/02/2017  . OPHTHALMOLOGY EXAM  03/07/2018  . MAMMOGRAM  06/03/2019  . PNEUMOCOCCAL POLYSACCHARIDE VACCINE (2) 06/02/2020  . TETANUS/TDAP  10/19/2025   Health Maintenance Review LIFESTYLE:  Exercise:   Not recnet  Tobacco/ETS:n Alcohol: n Sugar beverages:1/2 sugar  And small oj  Sleep:3-4 hours  Drug use: no HH of 2  ROS:  GEN/ HEENT: No fever, significant weight changes sweats headaches vision problems hearing changes, CV/ PULM; N cough, syncope,edema   ? If  change in exercise tolerance. GI /GU: No adominal pain, vomiting, change in bowel habits. No blood in the stool. No significant GU symptoms.some burping   SKIN/HEME: ,no acute skin rashes  suspicious lesions or bleeding. No lymphadenopathy, nodules, masses.  NEURO/ PSYCH:  No neurologic signs such as weakness numbness. No  New depression anxiety. IMM/ Allergy: No unusual infections.  Allergy .   REST of 12 system review negative except as per HPI   Past Medical History:  Diagnosis Date  . Allergic rhinitis   . Allergy   . Anxiety   . Chest pain    Hx, no problems as of 08/10/15  . Depression   . Diabetes mellitus    Type II  . Dysmenorrhea   . Fatigue    Hx, no current problems as of 08/10/15  . Fibroids   . GERD (gastroesophageal reflux disease)    diet controlled, no meds  . Hyperlipidemia   . Hypertension   . IBS (irritable bowel syndrome)    no meds, diet controlled  . Impaired fasting glucose   . Knee pain, bilateral    Hx, no current problems as of 08/10/15  . Leukoplakia oral mucosa    2011  . Menorrhagia   . OSA (obstructive sleep apnea)    does not use cpap, never went to get machine  . Other dysfunctions of sleep stages or arousal from sleep   . Sleep apnea  Does not use CPAP  . Snoring    had a sleep study mild [facility] 2009    Past Surgical History:  Procedure Laterality Date  . BREAST LUMPECTOMY WITH RADIOACTIVE SEED LOCALIZATION Right 03/05/2015   Procedure: RIGHT BREAST LUMPECTOMY WITH RADIOACTIVE SEED LOCALIZATION;  Surgeon: Erroll Luna, MD;  Location: [facility];  Service: General;  Laterality: Right;  . CESAREAN SECTION  1997  . COLONOSCOPY  2007   [facility] - polyp  . DILATION AND CURETTAGE OF UTERUS    . ECTOPIC PREGNANCY SURGERY  2000   laparoscopy  . oral biopsy      Family History  Problem Relation Age of Onset  . Lung cancer Mother   . Cancer Mother        lung   . Prostate cancer Father   . Cancer Father        prostate  . Diabetes Father   . Hyperlipidemia Father   . Hypertension Father   . Asthma Father   . Cancer Maternal Aunt        breast  . Cancer Maternal Aunt        breast  . Cancer Maternal Aunt         breast  . Colon polyps Neg Hx   . Esophageal cancer Neg Hx   . Rectal cancer Neg Hx   . Stomach cancer Neg Hx     Social History   Socioeconomic History  . Marital status: Married    Spouse name: Not on file  . Number of children: Not on file  . Years of education: Not on file  . Highest education level: Not on file  Occupational History  . Not on file  Social Needs  . Financial resource strain: Not on file  . Food insecurity:    Worry: Not on file    Inability: Not on file  . Transportation needs:    Medical: Not on file    Non-medical: Not on file  Tobacco Use  . Smoking status: Never Smoker  . Smokeless tobacco: Never Used  Substance and Sexual Activity  . Alcohol use: Yes    Comment: social wine  . Drug use: No  . Sexual activity: Not Currently    Birth control/protection: IUD    Comment: mirena - inserted in 2015  Lifestyle  . Physical activity:    Days per week: Not on file    Minutes per session: Not on file  . Stress: Not on file  Relationships  . Social connections:    Talks on phone: Not on file    Gets together: Not on file    Attends religious service: Not on file    Active member of club or organization: Not on file    Attends meetings of clubs or organizations: Not on file    Relationship status: Not on file  Other Topics Concern  . 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 acetaminophen (TYLENOL) 325 MG tablet Take 2 tablets (650 mg total) by mouth every 8 (eight) hours as needed for pain (headache). 100 tablet 2  . aspirin 81 MG EC tablet TAKE 1 TABLET BY MOUTH EVERY DAY 100 tablet 0  . atorvastatin (LIPITOR)  20 MG tablet Take 1 tablet (20 mg total) by mouth daily. 90 tablet 1  . Biotin 1000 MCG tablet Take 1,000  mcg by mouth 3 (three) times daily.    . empagliflozin (JARDIANCE) 10 MG TABS tablet Take 10 mg by mouth daily. 90 tablet 3  . fluticasone (FLONASE) 50 MCG/ACT nasal spray 2 spray each nostril qd 16 g 11  . levonorgestrel (MIRENA) 20 MCG/24HR IUD 1 each by Intrauterine route once.      . Multiple Vitamin (MULTI-VITAMIN PO) Take by mouth.    Meribeth Mattes SENSITIVE 0.5-0.9 % SOLN INT 1 GTT IN OU TID  11  . telmisartan-hydrochlorothiazide (MICARDIS HCT) 80-12.5 MG tablet TAKE 1 TABLET BY MOUTH  DAILY 90 tablet 1  . buPROPion (WELLBUTRIN XL) 300 MG 24 hr tablet Take 1 tablet (300 mg total) by mouth daily. 90 tablet 3  . cetirizine (ZYRTEC) 10 MG tablet TAKE 1 TABLET BY MOUTH DAILY 30 tablet 11   No facility-administered medications prior to visit.      EXAM:  BP 116/78 (BP Location: Right Arm, Patient Position: Sitting, Cuff Size: Large)   Pulse 74   Temp 98.1 F (36.7 C) (Oral)   Ht 5' 5.5" (1.664 m)   Wt 237 lb 4.8 oz (107.6 kg)   SpO2 98%   BMI 38.89 kg/m   Body mass index is 38.89 kg/m. Wt Readings from Last 3 Encounters:  07/28/17 237 lb 4.8 oz (107.6 kg)  01/19/17 230 lb (104.3 kg)  09/02/16 231 lb 4.8 oz (104.9 kg)    Physical Exam: Vital signs reviewed KWI:OXBD is a well-developed well-nourished alert cooperative    who appearsr stated age in no acute distress.  HEENT: normocephalic atraumatic , Eyes: PERRL EOM's full, conjunctiva clear, Nares: paten,t no deformity discharge or tenderness., Ears: no deformity EAC's clear TMs with normal landmarks. Mouth: clear OP, no lesions, edema.  Moist mucous membranes. Dentition in adequate repair. NECK: supple without masses, thyromegaly or bruits. Full  CHEST/PULM:  Clear to auscultation and percussion breath sounds equal no wheeze , rales or rhonchi. No chest wall deformities or tenderness. Breast: normal by inspection . No dimpling, discharge, masses, tenderness or discharge . CV: PMI is nondisplaced, S1 S2 no gallops, murmurs, rubs.  Peripheral pulses are full without delay.No JVD .  ABDOMEN: Bowel sounds normal nontender  No guard or rebound, no hepato splenomegal no CVA tenderness.  No hernia. Extremtities:  No clubbing cyanosis or edema, no acute joint swelling or redness no focal atrophy NEURO:  Oriented x3, cranial nerves 3-12 appear to be intact, no obvious focal weakness,gait within normal limits no abnormal reflexes or asymmetrical SKIN: No acute rashes normal turgor, color, no bruising or petechiae.  hyper pig ant chest and neck   PSYCH: Oriented, good eye contact, no obvious depression anxiety, cognition and judgment appear normal. LN: no cervical axillary adenopathy EKG nsr no acute findings     r r'rate normal  Diabetic Foot Exam - Simple   Simple Foot Form Diabetic Foot exam was performed with the following findings:  Yes 07/28/2017  9:15 AM  Visual Inspection No deformities, no ulcerations, no other skin breakdown bilaterally:  Yes Sensation Testing Intact to touch and monofilament testing bilaterally:  Yes Pulse Check Posterior Tibialis and Dorsalis pulse intact bilaterally:  Yes Comments     Lab Results  Component Value Date   WBC 4.5 07/28/2017   HGB 12.6 07/28/2017   HCT 39.1 07/28/2017   PLT 411.0 (H) 07/28/2017  GLUCOSE 126 (H) 07/28/2017   CHOL 195 07/28/2017   TRIG 76.0 07/28/2017   HDL 49.60 07/28/2017   LDLDIRECT 162.5 03/07/2013   LDLCALC 130 (H) 07/28/2017   ALT 10 07/28/2017   AST 12 07/28/2017   NA 143 07/28/2017   K 4.7 07/28/2017   CL 105 07/28/2017   CREATININE 1.02 07/28/2017   BUN 11 07/28/2017   CO2 31 07/28/2017   TSH 0.94 07/28/2017   HGBA1C 7.6 (H) 07/28/2017   MICROALBUR 0.8 07/28/2017    BP Readings from Last 3 Encounters:  07/28/17 116/78  07/04/17 113/70  01/19/17 118/84    ASSESSMENT AND PLAN:  Discussed the following assessment and plan:  Visit for preventive health examination  Medication management - Plan: CBC with Differential/Platelet,  Hemoglobin A1c, Lipid panel, TSH, Microalbumin / creatinine urine ratio, CMP, Troponin I  Type 2 diabetes mellitus with complication, without long-term current use of insulin (HCC) - Plan: CBC with Differential/Platelet, Hemoglobin A1c, Lipid panel, TSH, Microalbumin / creatinine urine ratio, CMP, Troponin I  Essential hypertension - Plan: CBC with Differential/Platelet, Hemoglobin A1c, Lipid panel, TSH, Microalbumin / creatinine urine ratio, CMP, Troponin I  Hyperlipidemia, unspecified hyperlipidemia type - Plan: CBC with Differential/Platelet, Hemoglobin A1c, Lipid panel, TSH, Microalbumin / creatinine urine ratio, CMP, Troponin I  Elevated platelet count - Plan: CBC with Differential/Platelet, Hemoglobin A1c, Lipid panel, TSH, Microalbumin / creatinine urine ratio, CMP, Troponin I  Chest pain, unspecified type - Plan: CBC with Differential/Platelet, Hemoglobin A1c, Lipid panel, TSH, Microalbumin / creatinine urine ratio, CMP, Troponin I, EKG 12-Lead Suspect  Cp si esophageal in nature   But  Has sig risk   Plan  Close fu and to ed if worse again  Lab today and  Add ppi and fu in 3 weeks or so  Lab today to be contacted when back  Is due for colon and working on  Details   Driving etc . If cp ongoing  May want  Gi to opine   consider  Echo and or stress test  If appropriate  Patient Care Team: Kyce Ging, Standley Brooking, MD as PCP - General Clance, Armando Reichert, MD (Pulmonary Disease) Earnstine Regal, PA-C as Physician Assistant (Obstetrics and Gynecology) Debbra Riding, MD as Consulting Physician (Ophthalmology) Patient Instructions  Cp may be esophagus pain possible  related to pill swallowing but needs close follow up .  We will begin an acid blocker in the ineterim and consider having you see a specialist .   Begin    prilosec   Each day .  EKG today  Is ok  Will notify you  of labs when available.  If getting worse  Still seek ed care .        Health Maintenance, Female Adopting a  healthy lifestyle and getting preventive care can go a long way to promote health and wellness. Talk with your health care provider about what schedule of regular examinations is right for you. This is a good chance for you to check in with your provider about disease prevention and staying healthy. In between checkups, there are plenty of things you can do on your own. Experts have done a lot of research about which lifestyle changes and preventive measures are most likely to keep you healthy. Ask your health care provider for more information. Weight and diet Eat a healthy diet  Be sure to include plenty of vegetables, fruits, low-fat dairy products, and lean protein.  Do not eat a lot of foods  high in solid fats, added sugars, or salt.  Get regular exercise. This is one of the most important things you can do for your health. ? Most adults should exercise for at least 150 minutes each week. The exercise should increase your heart rate and make you sweat (moderate-intensity exercise). ? Most adults should also do strengthening exercises at least twice a week. This is in addition to the moderate-intensity exercise.  Maintain a healthy weight  Body mass index (BMI) is a measurement that can be used to identify possible weight problems. It estimates body fat based on height and weight. Your health care provider can help determine your BMI and help you achieve or maintain a healthy weight.  For females 13 years of age and older: ? A BMI below 18.5 is considered underweight. ? A BMI of 18.5 to 24.9 is normal. ? A BMI of 25 to 29.9 is considered overweight. ? A BMI of 30 and above is considered obese.  Watch levels of cholesterol and blood lipids  You should start having your blood tested for lipids and cholesterol at 54 years of age, then have this test every 5 years.  You may need to have your cholesterol levels checked more often if: ? Your lipid or cholesterol levels are high. ? You are  older than 54 years of age. ? You are at high risk for heart disease.  Cancer screening Lung Cancer  Lung cancer screening is recommended for adults 39-43 years old who are at high risk for lung cancer because of a history of smoking.  A yearly low-dose CT scan of the lungs is recommended for people who: ? Currently smoke. ? Have quit within the past 15 years. ? Have at least a 30-pack-year history of smoking. A pack year is smoking an average of one pack of cigarettes a day for 1 year.  Yearly screening should continue until it has been 15 years since you quit.  Yearly screening should stop if you develop a health problem that would prevent you from having lung cancer treatment.  Breast Cancer  Practice breast self-awareness. This means understanding how your breasts normally appear and feel.  It also means doing regular breast self-exams. Let your health care provider know about any changes, no matter how small.  If you are in your 20s or 30s, you should have a clinical breast exam (CBE) by a health care provider every 1-3 years as part of a regular health exam.  If you are 53 or older, have a CBE every year. Also consider having a breast X-ray (mammogram) every year.  If you have a family history of breast cancer, talk to your health care provider about genetic screening.  If you are at high risk for breast cancer, talk to your health care provider about having an MRI and a mammogram every year.  Breast cancer gene (BRCA) assessment is recommended for women who have family members with BRCA-related cancers. BRCA-related cancers include: ? Breast. ? Ovarian. ? Tubal. ? Peritoneal cancers.  Results of the assessment will determine the need for genetic counseling and BRCA1 and BRCA2 testing.  Cervical Cancer Your health care provider may recommend that you be screened regularly for cancer of the pelvic organs (ovaries, uterus, and vagina). This screening involves a pelvic  examination, including checking for microscopic changes to the surface of your cervix (Pap test). You may be encouraged to have this screening done every 3 years, beginning at age 98.  For women ages 35-65,  health care providers may recommend pelvic exams and Pap testing every 3 years, or they may recommend the Pap and pelvic exam, combined with testing for human papilloma virus (HPV), every 5 years. Some types of HPV increase your risk of cervical cancer. Testing for HPV may also be done on women of any age with unclear Pap test results.  Other health care providers may not recommend any screening for nonpregnant women who are considered low risk for pelvic cancer and who do not have symptoms. Ask your health care provider if a screening pelvic exam is right for you.  If you have had past treatment for cervical cancer or a condition that could lead to cancer, you need Pap tests and screening for cancer for at least 20 years after your treatment. If Pap tests have been discontinued, your risk factors (such as having a new sexual partner) need to be reassessed to determine if screening should resume. Some women have medical problems that increase the chance of getting cervical cancer. In these cases, your health care provider may recommend more frequent screening and Pap tests.  Colorectal Cancer  This type of cancer can be detected and often prevented.  Routine colorectal cancer screening usually begins at 54 years of age and continues through 54 years of age.  Your health care provider may recommend screening at an earlier age if you have risk factors for colon cancer.  Your health care provider may also recommend using home test kits to check for hidden blood in the stool.  A small camera at the end of a tube can be used to examine your colon directly (sigmoidoscopy or colonoscopy). This is done to check for the earliest forms of colorectal cancer.  Routine screening usually begins at age  51.  Direct examination of the colon should be repeated every 5-10 years through 54 years of age. However, you may need to be screened more often if early forms of precancerous polyps or small growths are found.  Skin Cancer  Check your skin from head to toe regularly.  Tell your health care provider about any new moles or changes in moles, especially if there is a change in a mole's shape or color.  Also tell your health care provider if you have a mole that is larger than the size of a pencil eraser.  Always use sunscreen. Apply sunscreen liberally and repeatedly throughout the day.  Protect yourself by wearing long sleeves, pants, a wide-brimmed hat, and sunglasses whenever you are outside.  Heart disease, diabetes, and high blood pressure  High blood pressure causes heart disease and increases the risk of stroke. High blood pressure is more likely to develop in: ? People who have blood pressure in the high end of the normal range (130-139/85-89 mm Hg). ? People who are overweight or obese. ? People who are African American.  If you are 7-57 years of age, have your blood pressure checked every 3-5 years. If you are 15 years of age or older, have your blood pressure checked every year. You should have your blood pressure measured twice-once when you are at a hospital or clinic, and once when you are not at a hospital or clinic. Record the average of the two measurements. To check your blood pressure when you are not at a hospital or clinic, you can use: ? An automated blood pressure machine at a pharmacy. ? A home blood pressure monitor.  If you are between 73 years and 28 years old, ask  your health care provider if you should take aspirin to prevent strokes.  Have regular diabetes screenings. This involves taking a blood sample to check your fasting blood sugar level. ? If you are at a normal weight and have a low risk for diabetes, have this test once every three years after 54  years of age. ? If you are overweight and have a high risk for diabetes, consider being tested at a younger age or more often. Preventing infection Hepatitis B  If you have a higher risk for hepatitis B, you should be screened for this virus. You are considered at high risk for hepatitis B if: ? You were born in a country where hepatitis B is common. Ask your health care provider which countries are considered high risk. ? Your parents were born in a high-risk country, and you have not been immunized against hepatitis B (hepatitis B vaccine). ? You have HIV or AIDS. ? You use needles to inject street drugs. ? You live with someone who has hepatitis B. ? You have had sex with someone who has hepatitis B. ? You get hemodialysis treatment. ? You take certain medicines for conditions, including cancer, organ transplantation, and autoimmune conditions.  Hepatitis C  Blood testing is recommended for: ? Everyone born from [birth year] through 1967. ? Anyone with known risk factors for hepatitis C.  Sexually transmitted infections (STIs)  You should be screened for sexually transmitted infections (STIs) including gonorrhea and chlamydia if: ? You are sexually active and are younger than 54 years of age. ? You are older than 54 years of age and your health care provider tells you that you are at risk for this type of infection. ? Your sexual activity has changed since you were last screened and you are at an increased risk for chlamydia or gonorrhea. Ask your health care provider if you are at risk.  If you do not have HIV, but are at risk, it may be recommended that you take a prescription medicine daily to prevent HIV infection. This is called pre-exposure prophylaxis (PrEP). You are considered at risk if: ? You are sexually active and do not regularly use condoms or know the HIV status of your partner(s). ? You take drugs by injection. ? You are sexually active with a partner who has HIV.  Talk  with your health care provider about whether you are at high risk of being infected with HIV. If you choose to begin PrEP, you should first be tested for HIV. You should then be tested every 3 months for as long as you are taking PrEP. Pregnancy  If you are premenopausal and you may become pregnant, ask your health care provider about preconception counseling.  If you may become pregnant, take 400 to 800 micrograms (mcg) of folic acid every day.  If you want to prevent pregnancy, talk to your health care provider about birth control (contraception). Osteoporosis and menopause  Osteoporosis is a disease in which the bones lose minerals and strength with aging. This can result in serious bone fractures. Your risk for osteoporosis can be identified using a bone density scan.  If you are 41 years of age or older, or if you are at risk for osteoporosis and fractures, ask your health care provider if you should be screened.  Ask your health care provider whether you should take a calcium or vitamin D supplement to lower your risk for osteoporosis.  Menopause may have certain physical symptoms and risks.  Hormone  replacement therapy may reduce some of these symptoms and risks. Talk to your health care provider about whether hormone replacement therapy is right for you. Follow these instructions at home:  Schedule regular health, dental, and eye exams.  Stay current with your immunizations.  Do not use any tobacco products including cigarettes, chewing tobacco, or electronic cigarettes.  If you are pregnant, do not drink alcohol.  If you are breastfeeding, limit how much and how often you drink alcohol.  Limit alcohol intake to no more than 1 drink per day for nonpregnant women. One drink equals 12 ounces of beer, 5 ounces of wine, or 1 ounces of hard liquor.  Do not use street drugs.  Do not share needles.  Ask your health care provider for help if you need support or information  about quitting drugs.  Tell your health care provider if you often feel depressed.  Tell your health care provider if you have ever been abused or do not feel safe at home. This information is not intended to replace advice given to you by your health care provider. Make sure you discuss any questions you have with your health care provider. Document Released: 10/04/2010 Document Revised: 08/27/2015 Document Reviewed: 12/23/2014 Elsevier Interactive Patient Education  2018 [place]. Georgene Kopper M.D.

## 2017-07-28 ENCOUNTER — Encounter: Payer: Self-pay | Admitting: Internal Medicine

## 2017-07-28 ENCOUNTER — Ambulatory Visit (INDEPENDENT_AMBULATORY_CARE_PROVIDER_SITE_OTHER): Payer: 59 | Admitting: Internal Medicine

## 2017-07-28 VITALS — BP 116/78 | HR 74 | Temp 98.1°F | Ht 65.5 in | Wt 237.3 lb

## 2017-07-28 DIAGNOSIS — I10 Essential (primary) hypertension: Secondary | ICD-10-CM | POA: Diagnosis not present

## 2017-07-28 DIAGNOSIS — R079 Chest pain, unspecified: Secondary | ICD-10-CM

## 2017-07-28 DIAGNOSIS — E118 Type 2 diabetes mellitus with unspecified complications: Secondary | ICD-10-CM

## 2017-07-28 DIAGNOSIS — R7989 Other specified abnormal findings of blood chemistry: Secondary | ICD-10-CM

## 2017-07-28 DIAGNOSIS — Z0000 Encounter for general adult medical examination without abnormal findings: Secondary | ICD-10-CM

## 2017-07-28 DIAGNOSIS — Z79899 Other long term (current) drug therapy: Secondary | ICD-10-CM | POA: Diagnosis not present

## 2017-07-28 DIAGNOSIS — E785 Hyperlipidemia, unspecified: Secondary | ICD-10-CM | POA: Diagnosis not present

## 2017-07-28 LAB — COMPREHENSIVE METABOLIC PANEL
ALBUMIN: 4 g/dL (ref 3.5–5.2)
ALT: 10 U/L (ref 0–35)
AST: 12 U/L (ref 0–37)
Alkaline Phosphatase: 69 U/L (ref 39–117)
BILIRUBIN TOTAL: 0.4 mg/dL (ref 0.2–1.2)
BUN: 11 mg/dL (ref 6–23)
CALCIUM: 9.6 mg/dL (ref 8.4–10.5)
CO2: 31 mEq/L (ref 19–32)
Chloride: 105 mEq/L (ref 96–112)
Creatinine, Ser: 1.02 mg/dL (ref 0.40–1.20)
GFR: 72.66 mL/min (ref >60.00)
Glucose, Bld: 126 mg/dL — ABNORMAL HIGH (ref 70–99)
Potassium: 4.7 mEq/L (ref 3.5–5.1)
Sodium: 143 mEq/L (ref 135–145)
Total Protein: 7.3 g/dL (ref 6.0–8.3)

## 2017-07-28 LAB — CBC WITH DIFFERENTIAL/PLATELET
BASOS ABS: 0 10*3/uL (ref 0.0–0.1)
Basophils Relative: 0.5 % (ref 0.0–3.0)
EOS PCT: 4.5 % (ref 0.0–5.0)
Eosinophils Absolute: 0.2 10*3/uL (ref 0.0–0.7)
HEMATOCRIT: 39.1 % (ref 36.0–46.0)
Hemoglobin: 12.6 g/dL (ref 12.0–15.0)
LYMPHS PCT: 37.5 % (ref 12.0–46.0)
Lymphs Abs: 1.7 10*3/uL (ref 0.7–4.0)
MCHC: 32.2 g/dL (ref 30.0–36.0)
MCV: 87.8 fl (ref 78.0–100.0)
MONOS PCT: 6.4 % (ref 3.0–12.0)
Monocytes Absolute: 0.3 10*3/uL (ref 0.1–1.0)
NEUTROS ABS: 2.3 10*3/uL (ref 1.4–7.7)
Neutrophils Relative %: 51.1 % (ref 43.0–77.0)
PLATELETS: 411 10*3/uL — AB (ref 150.0–400.0)
RBC: 4.45 Mil/uL (ref 3.87–5.11)
RDW: 14.5 % (ref 11.5–15.5)
WBC: 4.5 10*3/uL (ref 4.0–10.5)

## 2017-07-28 LAB — HEMOGLOBIN A1C: Hgb A1c MFr Bld: 7.6 % — ABNORMAL HIGH (ref 4.6–6.5)

## 2017-07-28 LAB — LIPID PANEL
CHOL/HDL RATIO: 4
Cholesterol: 195 mg/dL (ref 0–200)
HDL: 49.6 mg/dL (ref >39.00)
LDL CALC: 130 mg/dL — AB (ref 0–99)
NONHDL: 145.24
TRIGLYCERIDES: 76 mg/dL (ref 0.0–149.0)
VLDL: 15.2 mg/dL (ref 0.0–40.0)

## 2017-07-28 LAB — TROPONIN I: TNIDX: 0.03 ug/L (ref 0.00–0.06)

## 2017-07-28 LAB — TSH: TSH: 0.94 u[IU]/mL (ref 0.35–4.50)

## 2017-07-28 LAB — MICROALBUMIN / CREATININE URINE RATIO
Creatinine,U: 223.4 mg/dL
MICROALB UR: 0.8 mg/dL (ref 0.0–1.9)
Microalb Creat Ratio: 0.4 mg/g (ref 0.0–30.0)

## 2017-07-28 MED ORDER — BUPROPION HCL ER (XL) 300 MG PO TB24: 300.0000 mg | ORAL_TABLET | Freq: Every day | ORAL | 3 refills | Status: DC

## 2017-07-28 MED ORDER — CETIRIZINE HCL 10 MG PO TABS: ORAL_TABLET | ORAL | 11 refills | Status: DC

## 2017-07-28 MED ORDER — OMEPRAZOLE 20 MG PO CPDR: 20.0000 mg | DELAYED_RELEASE_CAPSULE | Freq: Every day | ORAL | 1 refills | Status: DC

## 2017-07-28 NOTE — Patient Instructions (Addendum)
Cp may be esophagus pain possible  related to pill swallowing but needs close follow up .  We will begin an acid blocker in the ineterim and consider having you see a specialist .   Begin    prilosec   Each day .  EKG today  Is ok  Will notify you  of labs when available.  If getting worse  Still seek ed care .        Health Maintenance, Female Adopting a healthy lifestyle and getting preventive care can go a long way to promote health and wellness. Talk with your health care provider about what schedule of regular examinations is right for you. This is a good chance for you to check in with your provider about disease prevention and staying healthy. In between checkups, there are plenty of things you can do on your own. Experts have done a lot of research about which lifestyle changes and preventive measures are most likely to keep you healthy. Ask your health care provider for more information. Weight and diet Eat a healthy diet  Be sure to include plenty of vegetables, fruits, low-fat dairy products, and lean protein.  Do not eat a lot of foods high in solid fats, added sugars, or salt.  Get regular exercise. This is one of the most important things you can do for your health. ? Most adults should exercise for at least 150 minutes each week. The exercise should increase your heart rate and make you sweat (moderate-intensity exercise). ? Most adults should also do strengthening exercises at least twice a week. This is in addition to the moderate-intensity exercise.  Maintain a healthy weight  Body mass index (BMI) is a measurement that can be used to identify possible weight problems. It estimates body fat based on height and weight. Your health care provider can help determine your BMI and help you achieve or maintain a healthy weight.  For females 31 years of age and older: ? A BMI below 18.5 is considered underweight. ? A BMI of 18.5 to 24.9 is normal. ? A BMI of 25 to 29.9 is  considered overweight. ? A BMI of 30 and above is considered obese.  Watch levels of cholesterol and blood lipids  You should start having your blood tested for lipids and cholesterol at 54 years of age, then have this test every 5 years.  You may need to have your cholesterol levels checked more often if: ? Your lipid or cholesterol levels are high. ? You are older than 54 years of age. ? You are at high risk for heart disease.  Cancer screening Lung Cancer  Lung cancer screening is recommended for adults 65-3 years old who are at high risk for lung cancer because of a history of smoking.  A yearly low-dose CT scan of the lungs is recommended for people who: ? Currently smoke. ? Have quit within the past 15 years. ? Have at least a 30-pack-year history of smoking. A pack year is smoking an average of one pack of cigarettes a day for 1 year.  Yearly screening should continue until it has been 15 years since you quit.  Yearly screening should stop if you develop a health problem that would prevent you from having lung cancer treatment.  Breast Cancer  Practice breast self-awareness. This means understanding how your breasts normally appear and feel.  It also means doing regular breast self-exams. Let your health care provider know about any changes, no matter how small.  If you are in your 20s or 30s, you should have a clinical breast exam (CBE) by a health care provider every 1-3 years as part of a regular health exam.  If you are 49 or older, have a CBE every year. Also consider having a breast X-ray (mammogram) every year.  If you have a family history of breast cancer, talk to your health care provider about genetic screening.  If you are at high risk for breast cancer, talk to your health care provider about having an MRI and a mammogram every year.  Breast cancer gene (BRCA) assessment is recommended for women who have family members with BRCA-related cancers.  BRCA-related cancers include: ? Breast. ? Ovarian. ? Tubal. ? Peritoneal cancers.  Results of the assessment will determine the need for genetic counseling and BRCA1 and BRCA2 testing.  Cervical Cancer Your health care provider may recommend that you be screened regularly for cancer of the pelvic organs (ovaries, uterus, and vagina). This screening involves a pelvic examination, including checking for microscopic changes to the surface of your cervix (Pap test). You may be encouraged to have this screening done every 3 years, beginning at age 75.  For women ages 7-65, health care providers may recommend pelvic exams and Pap testing every 3 years, or they may recommend the Pap and pelvic exam, combined with testing for human papilloma virus (HPV), every 5 years. Some types of HPV increase your risk of cervical cancer. Testing for HPV may also be done on women of any age with unclear Pap test results.  Other health care providers may not recommend any screening for nonpregnant women who are considered low risk for pelvic cancer and who do not have symptoms. Ask your health care provider if a screening pelvic exam is right for you.  If you have had past treatment for cervical cancer or a condition that could lead to cancer, you need Pap tests and screening for cancer for at least 20 years after your treatment. If Pap tests have been discontinued, your risk factors (such as having a new sexual partner) need to be reassessed to determine if screening should resume. Some women have medical problems that increase the chance of getting cervical cancer. In these cases, your health care provider may recommend more frequent screening and Pap tests.  Colorectal Cancer  This type of cancer can be detected and often prevented.  Routine colorectal cancer screening usually begins at 54 years of age and continues through 54 years of age.  Your health care provider may recommend screening at an earlier age if  you have risk factors for colon cancer.  Your health care provider may also recommend using home test kits to check for hidden blood in the stool.  A small camera at the end of a tube can be used to examine your colon directly (sigmoidoscopy or colonoscopy). This is done to check for the earliest forms of colorectal cancer.  Routine screening usually begins at age 47.  Direct examination of the colon should be repeated every 5-10 years through 54 years of age. However, you may need to be screened more often if early forms of precancerous polyps or small growths are found.  Skin Cancer  Check your skin from head to toe regularly.  Tell your health care provider about any new moles or changes in moles, especially if there is a change in a mole's shape or color.  Also tell your health care provider if you have a mole that is  larger than the size of a pencil eraser.  Always use sunscreen. Apply sunscreen liberally and repeatedly throughout the day.  Protect yourself by wearing long sleeves, pants, a wide-brimmed hat, and sunglasses whenever you are outside.  Heart disease, diabetes, and high blood pressure  High blood pressure causes heart disease and increases the risk of stroke. High blood pressure is more likely to develop in: ? People who have blood pressure in the high end of the normal range (130-139/85-89 mm Hg). ? People who are overweight or obese. ? People who are African American.  If you are 19-59 years of age, have your blood pressure checked every 3-5 years. If you are 63 years of age or older, have your blood pressure checked every year. You should have your blood pressure measured twice-once when you are at a hospital or clinic, and once when you are not at a hospital or clinic. Record the average of the two measurements. To check your blood pressure when you are not at a hospital or clinic, you can use: ? An automated blood pressure machine at a pharmacy. ? A home blood  pressure monitor.  If you are between 38 years and 45 years old, ask your health care provider if you should take aspirin to prevent strokes.  Have regular diabetes screenings. This involves taking a blood sample to check your fasting blood sugar level. ? If you are at a normal weight and have a low risk for diabetes, have this test once every three years after 54 years of age. ? If you are overweight and have a high risk for diabetes, consider being tested at a younger age or more often. Preventing infection Hepatitis B  If you have a higher risk for hepatitis B, you should be screened for this virus. You are considered at high risk for hepatitis B if: ? You were born in a country where hepatitis B is common. Ask your health care provider which countries are considered high risk. ? Your parents were born in a high-risk country, and you have not been immunized against hepatitis B (hepatitis B vaccine). ? You have HIV or AIDS. ? You use needles to inject street drugs. ? You live with someone who has hepatitis B. ? You have had sex with someone who has hepatitis B. ? You get hemodialysis treatment. ? You take certain medicines for conditions, including cancer, organ transplantation, and autoimmune conditions.  Hepatitis C  Blood testing is recommended for: ? Everyone born from [birth year] through 1967. ? Anyone with known risk factors for hepatitis C.  Sexually transmitted infections (STIs)  You should be screened for sexually transmitted infections (STIs) including gonorrhea and chlamydia if: ? You are sexually active and are younger than 54 years of age. ? You are older than 54 years of age and your health care provider tells you that you are at risk for this type of infection. ? Your sexual activity has changed since you were last screened and you are at an increased risk for chlamydia or gonorrhea. Ask your health care provider if you are at risk.  If you do not have HIV, but are at risk,  it may be recommended that you take a prescription medicine daily to prevent HIV infection. This is called pre-exposure prophylaxis (PrEP). You are considered at risk if: ? You are sexually active and do not regularly use condoms or know the HIV status of your partner(s). ? You take drugs by injection. ? You are sexually active  with a partner who has HIV.  Talk with your health care provider about whether you are at high risk of being infected with HIV. If you choose to begin PrEP, you should first be tested for HIV. You should then be tested every 3 months for as long as you are taking PrEP. Pregnancy  If you are premenopausal and you may become pregnant, ask your health care provider about preconception counseling.  If you may become pregnant, take 400 to 800 micrograms (mcg) of folic acid every day.  If you want to prevent pregnancy, talk to your health care provider about birth control (contraception). Osteoporosis and menopause  Osteoporosis is a disease in which the bones lose minerals and strength with aging. This can result in serious bone fractures. Your risk for osteoporosis can be identified using a bone density scan.  If you are 76 years of age or older, or if you are at risk for osteoporosis and fractures, ask your health care provider if you should be screened.  Ask your health care provider whether you should take a calcium or vitamin D supplement to lower your risk for osteoporosis.  Menopause may have certain physical symptoms and risks.  Hormone replacement therapy may reduce some of these symptoms and risks. Talk to your health care provider about whether hormone replacement therapy is right for you. Follow these instructions at home:  Schedule regular health, dental, and eye exams.  Stay current with your immunizations.  Do not use any tobacco products including cigarettes, chewing tobacco, or electronic cigarettes.  If you are pregnant, do not drink  alcohol.  If you are breastfeeding, limit how much and how often you drink alcohol.  Limit alcohol intake to no more than 1 drink per day for nonpregnant women. One drink equals 12 ounces of beer, 5 ounces of wine, or 1 ounces of hard liquor.  Do not use street drugs.  Do not share needles.  Ask your health care provider for help if you need support or information about quitting drugs.  Tell your health care provider if you often feel depressed.  Tell your health care provider if you have ever been abused or do not feel safe at home. This information is not intended to replace advice given to you by your health care provider. Make sure you discuss any questions you have with your health care provider. Document Released: 10/04/2010 Document Revised: 08/27/2015 Document Reviewed: 12/23/2014 Elsevier Interactive Patient Education  [employer].

## 2017-08-03 ENCOUNTER — Ambulatory Visit: Payer: 59 | Admitting: Podiatry

## 2017-08-17 NOTE — Progress Notes (Signed)
Chief Complaint  Patient presents with  . Follow-up    patient states chest pain has resolved    HPI: Crystal Townsend 52 y.o. come in for  Atypical chest pain  Felt prob from esophageal cause  See last note   Since last visit   Cp s  better aftera a week and now seems to be gone   No new sx  No sob  No se of med   doing ok  Here for fu labs also DM here for labs  HT controlled Lipid  No se of med taking atorva 20  ROS: See pertinent positives and negatives per HPI. No sig cough x  Pollen allergy  No vomiting  Sleep ok   Past Medical History:  Diagnosis Date  . Allergic rhinitis   . Allergy   . Anxiety   . Chest pain    Hx, no problems as of 08/10/15  . Depression   . Diabetes mellitus    Type II  . Dysmenorrhea   . Fatigue    Hx, no current problems as of 08/10/15  . Fibroids   . GERD (gastroesophageal reflux disease)    diet controlled, no meds  . Hyperlipidemia   . Hypertension   . IBS (irritable bowel syndrome)    no meds, diet controlled  . Impaired fasting glucose   . Knee pain, bilateral    Hx, no current problems as of 08/10/15  . Leukoplakia oral mucosa    2011  . Menorrhagia   . OSA (obstructive sleep apnea)    does not use cpap, never went to get machine  . Other dysfunctions of sleep stages or arousal from sleep   . Sleep apnea    Does not use CPAP  . Snoring    had a sleep study mild osa 2009    Family History  Problem Relation Age of Onset  . Lung cancer Mother   . Cancer Mother        lung   . Prostate cancer Father   . Cancer Father        prostate  . Diabetes Father   . Hyperlipidemia Father   . Hypertension Father   . Asthma Father   . Cancer Maternal Aunt        breast  . Cancer Maternal Aunt        breast  . Cancer Maternal Aunt        breast  . Colon polyps Neg Hx   . Esophageal cancer Neg Hx   . Rectal cancer Neg Hx   . Stomach cancer Neg Hx     Social History   Socioeconomic History  . Marital status: Married   Spouse name: Not on file  . Number of children: Not on file  . Years of education: Not on file  . Highest education level: Not on file  Occupational History  . Not on file  Social Needs  . Financial resource strain: Not on file  . Food insecurity:    Worry: Not on file    Inability: Not on file  . Transportation needs:    Medical: Not on file    Non-medical: Not on file  Tobacco Use  . Smoking status: Never Smoker  . Smokeless tobacco: Never Used  Substance and Sexual Activity  . Alcohol use: Yes    Comment: social wine  . Drug use: No  . Sexual activity: Not Currently    Birth control/protection: IUD  Comment: mirena - inserted in 2015  Lifestyle  . Physical activity:    Days per week: Not on file    Minutes per session: Not on file  . Stress: Not on file  Relationships  . Social connections:    Talks on phone: Not on file    Gets together: Not on file    Attends religious service: Not on file    Active member of club or organization: Not on file    Attends meetings of clubs or organizations: Not on file    Relationship status: Not on file  Other Topics Concern  . 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 acetaminophen (TYLENOL) 325 MG tablet Take 2 tablets (650 mg total) by mouth every 8 (eight) hours as needed for pain (headache). 100 tablet 2  . aspirin 81 MG EC tablet TAKE 1 TABLET BY MOUTH EVERY DAY 100 tablet 0  . atorvastatin (LIPITOR) 20 MG tablet Take 1 tablet (20 mg total) by mouth daily. 90 tablet 1  . Biotin 1000 MCG tablet Take 1,000 mcg by mouth 3 (three) times daily.    Marland Kitchen buPROPion (WELLBUTRIN XL) 300 MG 24 hr tablet Take 1 tablet (300 mg total) by mouth daily. 90 tablet 3  . cetirizine (ZYRTEC) 10 MG tablet TAKE 1 TABLET BY  MOUTH DAILY 30 tablet 11  . empagliflozin (JARDIANCE) 10 MG TABS tablet Take 10 mg by mouth daily. 90 tablet 3  . fluticasone (FLONASE) 50 MCG/ACT nasal spray 2 spray each nostril qd 16 g 11  . levonorgestrel (MIRENA) 20 MCG/24HR IUD 1 each by Intrauterine route once.      . Multiple Vitamin (MULTI-VITAMIN PO) Take by mouth.    Marland Kitchen omeprazole (PRILOSEC) 20 MG capsule Take 1 capsule (20 mg total) by mouth daily. 30 capsule 1  . OPTIVE SENSITIVE 0.5-0.9 % SOLN INT 1 GTT IN OU TID  11  . telmisartan-hydrochlorothiazide (MICARDIS HCT) 80-12.5 MG tablet TAKE 1 TABLET BY MOUTH  DAILY 90 tablet 1   No facility-administered medications prior to visit.      EXAM:  BP 108/78 (BP Location: Left Arm, Patient Position: Sitting, Cuff Size: Large)   Pulse 84   Temp 98.5 F (36.9 C) (Oral)   Ht 5' 5.5" (1.664 m)   Wt 232 lb (105.2 kg)   SpO2 96%   BMI 38.02 kg/m   Body mass index is 38.02 kg/m.  GENERAL: vitals reviewed and listed above, alert, oriented, appears well hydrated and in no acute distress HEENT: atraumatic, conjunctiva  clear, no obvious abnormalities on inspection of external nose and ears OP : no lesion edema or exudate  NECK: no obvious masses on inspection palpation  LUNGS: clear to auscultation bilaterally, no wheezes, rales or rhonchi, good air movement CV: HRRR, no clubbing cyanosis or  peripheral edema nl cap refill  Abdomen:  Sof,t normal bowel sounds without hepatosplenomegaly, no guarding rebound or masses no CVA tenderness MS: moves all extremities without noticeable focal  abnormality PSYCH: pleasant and cooperative, no obvious depression or anxiety Lab Results  Component Value Date   WBC 4.5 07/28/2017   HGB 12.6 07/28/2017   HCT 39.1 07/28/2017   PLT 411.0 (H) 07/28/2017   GLUCOSE 126 (  H) 07/28/2017   CHOL 195 07/28/2017   TRIG 76.0 07/28/2017   HDL 49.60 07/28/2017   LDLDIRECT 162.5 03/07/2013   LDLCALC 130 (H) 07/28/2017   ALT 10 07/28/2017   AST 12  07/28/2017   NA 143 07/28/2017   K 4.7 07/28/2017   CL 105 07/28/2017   CREATININE 1.02 07/28/2017   BUN 11 07/28/2017   CO2 31 07/28/2017   TSH 0.94 07/28/2017   HGBA1C 7.6 (H) 07/28/2017   MICROALBUR 0.8 07/28/2017   BP Readings from Last 3 Encounters:  08/18/17 108/78  07/28/17 116/78  07/04/17 113/70    ASSESSMENT AND PLAN:  Discussed the following assessment and plan:  Chest pain, unspecified type - responsed well to ppi  to continue for nwo and then fu decid to wean  at fu - Plan: Lipid panel, Hemoglobin A1c  Type 2 diabetes mellitus with complication, without long-term current use of insulin (HCC) - some detrioratino of ocntrol wants to do lsi before increasing  to jardiance  25 - Plan: Lipid panel, Hemoglobin A1c  Medication management - Plan: Lipid panel, Hemoglobin A1c  Essential hypertension - controlled  - Plan: Lipid panel, Hemoglobin A1c  Hyperlipidemia, unspecified hyperlipidemia type - intensify rx in to 40 mg atorva - Plan: Lipid panel, Hemoglobin A1c  -Patient advised to return or notify health care team  if  new concerns arise.  Patient Instructions  Stay on the omeprazole  For now.   Increase the atorvastatin   40 mg    Per day  Contact for sending in . New rx   Work on [employer] .    consider increasing the jardiance to 25 mg .  If needed  rov in   End of July .   Fasting lab pre visit      Standley Brooking. Panosh M.D.

## 2017-08-18 ENCOUNTER — Ambulatory Visit (INDEPENDENT_AMBULATORY_CARE_PROVIDER_SITE_OTHER): Payer: 59 | Admitting: Internal Medicine

## 2017-08-18 ENCOUNTER — Encounter: Payer: Self-pay | Admitting: Internal Medicine

## 2017-08-18 VITALS — BP 108/78 | HR 84 | Temp 98.5°F | Ht 65.5 in | Wt 232.0 lb

## 2017-08-18 DIAGNOSIS — Z79899 Other long term (current) drug therapy: Secondary | ICD-10-CM

## 2017-08-18 DIAGNOSIS — E785 Hyperlipidemia, unspecified: Secondary | ICD-10-CM

## 2017-08-18 DIAGNOSIS — I10 Essential (primary) hypertension: Secondary | ICD-10-CM | POA: Diagnosis not present

## 2017-08-18 DIAGNOSIS — E118 Type 2 diabetes mellitus with unspecified complications: Secondary | ICD-10-CM

## 2017-08-18 DIAGNOSIS — R079 Chest pain, unspecified: Secondary | ICD-10-CM | POA: Diagnosis not present

## 2017-08-18 NOTE — Patient Instructions (Addendum)
Stay on the omeprazole  For now.   Increase the atorvastatin   40 mg    Per day  Contact for sending in . New rx   Work on [employer] .    consider increasing the jardiance to 25 mg .  If needed  rov in   End of July .   Fasting lab pre visit

## 2017-08-27 ENCOUNTER — Other Ambulatory Visit: Payer: Self-pay | Admitting: Internal Medicine

## 2017-09-14 DIAGNOSIS — R635 Abnormal weight gain: Secondary | ICD-10-CM | POA: Diagnosis not present

## 2017-09-14 DIAGNOSIS — N951 Menopausal and female climacteric states: Secondary | ICD-10-CM | POA: Diagnosis not present

## 2017-09-21 DIAGNOSIS — R5383 Other fatigue: Secondary | ICD-10-CM | POA: Diagnosis not present

## 2017-09-26 DIAGNOSIS — I10 Essential (primary) hypertension: Secondary | ICD-10-CM | POA: Diagnosis not present

## 2017-10-02 ENCOUNTER — Other Ambulatory Visit: Payer: 59

## 2017-10-02 DIAGNOSIS — I10 Essential (primary) hypertension: Secondary | ICD-10-CM | POA: Diagnosis not present

## 2017-10-09 DIAGNOSIS — I10 Essential (primary) hypertension: Secondary | ICD-10-CM | POA: Diagnosis not present

## 2017-10-16 DIAGNOSIS — I10 Essential (primary) hypertension: Secondary | ICD-10-CM | POA: Diagnosis not present

## 2017-10-16 DIAGNOSIS — E119 Type 2 diabetes mellitus without complications: Secondary | ICD-10-CM | POA: Diagnosis not present

## 2017-10-16 DIAGNOSIS — E782 Mixed hyperlipidemia: Secondary | ICD-10-CM | POA: Diagnosis not present

## 2017-10-23 DIAGNOSIS — E119 Type 2 diabetes mellitus without complications: Secondary | ICD-10-CM | POA: Diagnosis not present

## 2017-10-30 ENCOUNTER — Ambulatory Visit: Payer: 59 | Admitting: Internal Medicine

## 2017-11-06 DIAGNOSIS — E669 Obesity, unspecified: Secondary | ICD-10-CM | POA: Diagnosis not present

## 2017-11-06 DIAGNOSIS — E119 Type 2 diabetes mellitus without complications: Secondary | ICD-10-CM | POA: Diagnosis not present

## 2017-11-10 ENCOUNTER — Other Ambulatory Visit (INDEPENDENT_AMBULATORY_CARE_PROVIDER_SITE_OTHER): Payer: 59

## 2017-11-10 ENCOUNTER — Other Ambulatory Visit: Payer: Self-pay | Admitting: Internal Medicine

## 2017-11-10 DIAGNOSIS — E785 Hyperlipidemia, unspecified: Secondary | ICD-10-CM

## 2017-11-10 DIAGNOSIS — E118 Type 2 diabetes mellitus with unspecified complications: Secondary | ICD-10-CM

## 2017-11-10 DIAGNOSIS — R079 Chest pain, unspecified: Secondary | ICD-10-CM

## 2017-11-10 DIAGNOSIS — Z79899 Other long term (current) drug therapy: Secondary | ICD-10-CM | POA: Diagnosis not present

## 2017-11-10 DIAGNOSIS — I10 Essential (primary) hypertension: Secondary | ICD-10-CM | POA: Diagnosis not present

## 2017-11-10 LAB — LIPID PANEL
CHOL/HDL RATIO: 4
Cholesterol: 137 mg/dL (ref 0–200)
HDL: 37.6 mg/dL — ABNORMAL LOW (ref >39.00)
LDL Cholesterol: 84 mg/dL (ref 0–99)
NONHDL: 99.79
TRIGLYCERIDES: 81 mg/dL (ref 0.0–149.0)
VLDL: 16.2 mg/dL (ref 0.0–40.0)

## 2017-11-10 LAB — HEMOGLOBIN A1C: Hgb A1c MFr Bld: 6.7 % — ABNORMAL HIGH (ref 4.6–6.5)

## 2017-11-13 DIAGNOSIS — K5900 Constipation, unspecified: Secondary | ICD-10-CM | POA: Diagnosis not present

## 2017-11-13 DIAGNOSIS — E559 Vitamin D deficiency, unspecified: Secondary | ICD-10-CM | POA: Diagnosis not present

## 2017-11-13 DIAGNOSIS — E782 Mixed hyperlipidemia: Secondary | ICD-10-CM | POA: Diagnosis not present

## 2017-11-14 ENCOUNTER — Encounter: Payer: Self-pay | Admitting: *Deleted

## 2017-11-14 NOTE — Progress Notes (Signed)
Chief Complaint  Patient presents with  . Follow-up    no new concerns    HPI: Crystal Townsend 52 y.o. come in for Chronic disease management   Stayed off buprion.   Off a month. Doing well denies sig depression  Now finding success at  [employer] .   Eating changes  Advice are helpful Using appetite suppressant.    Also shots of b12 and MIK??  Doing better. Weekly. Visits last bg 100 range  No lows. takes zyrtec as needed  Needs refill.  LIPIDs   Ok  ROS: See pertinent positives and negatives per HPI. No cp sob numbness   Past Medical History:  Diagnosis Date  . Allergic rhinitis   . Allergy   . Anxiety   . Chest pain    Hx, no problems as of 08/10/15  . Depression   . Diabetes mellitus    Type II  . Dysmenorrhea   . Fatigue    Hx, no current problems as of 08/10/15  . Fibroids   . GERD (gastroesophageal reflux disease)    diet controlled, no meds  . Hyperlipidemia   . Hypertension   . IBS (irritable bowel syndrome)    no meds, diet controlled  . Impaired fasting glucose   . Knee pain, bilateral    Hx, no current problems as of 08/10/15  . Leukoplakia oral mucosa    2011  . Menorrhagia   . OSA (obstructive sleep apnea)    does not use cpap, never went to get machine  . Other dysfunctions of sleep stages or arousal from sleep   . Sleep apnea    Does not use CPAP  . Snoring    had a sleep study mild osa 2009    Family History  Problem Relation Age of Onset  . Lung cancer Mother   . Cancer Mother        lung   . Prostate cancer Father   . Cancer Father        prostate  . Diabetes Father   . Hyperlipidemia Father   . Hypertension Father   . Asthma Father   . Cancer Maternal Aunt        breast  . Cancer Maternal Aunt        breast  . Cancer Maternal Aunt        breast  . Colon polyps Neg Hx   . Esophageal cancer Neg Hx   . Rectal cancer Neg Hx   . Stomach cancer Neg Hx     Social History   Socioeconomic History  . Marital status: Married    Spouse  name: Not on file  . Number of children: Not on file  . Years of education: Not on file  . Highest education level: Not on file  Occupational History  . Not on file  Social Needs  . Financial resource strain: Not on file  . Food insecurity:    Worry: Not on file    Inability: Not on file  . Transportation needs:    Medical: Not on file    Non-medical: Not on file  Tobacco Use  . Smoking status: Never Smoker  . Smokeless tobacco: Never Used  Substance and Sexual Activity  . Alcohol use: Yes    Comment: social wine  . Drug use: No  . Sexual activity: Not Currently    Birth control/protection: IUD    Comment: mirena - inserted in 2015  Lifestyle  .  Physical activity:    Days per week: Not on file    Minutes per session: Not on file  . Stress: Not on file  Relationships  . Social connections:    Talks on phone: Not on file    Gets together: Not on file    Attends religious service: Not on file    Active member of club or organization: Not on file    Attends meetings of clubs or organizations: Not on file    Relationship status: Not on file  Other Topics Concern  . 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 acetaminophen (TYLENOL) 325 MG tablet Take 2 tablets (650 mg total) by mouth every 8 (eight) hours as needed for pain (headache). 100 tablet 2  . aspirin 81 MG EC tablet TAKE 1 TABLET BY MOUTH EVERY DAY 100 tablet 0  . atorvastatin (LIPITOR) 20 MG tablet TAKE 1 TABLET BY MOUTH  DAILY 90 tablet 0  . Biotin 1000 MCG tablet Take 1,000 mcg by mouth 3 (three) times daily.    Marland Kitchen buPROPion (WELLBUTRIN XL) 300 MG 24 hr tablet Take 1 tablet (300 mg total) by mouth daily. 90 tablet 3  . empagliflozin (JARDIANCE) 10 MG TABS tablet Take 10 mg by mouth daily. 90  tablet 3  . fluticasone (FLONASE) 50 MCG/ACT nasal spray 2 spray each nostril qd 16 g 11  . levonorgestrel (MIRENA) 20 MCG/24HR IUD 1 each by Intrauterine route once.      . Multiple Vitamin (MULTI-VITAMIN PO) Take by mouth.    Marland Kitchen omeprazole (PRILOSEC) 20 MG capsule Take 1 capsule (20 mg total) by mouth daily. 30 capsule 1  . OPTIVE SENSITIVE 0.5-0.9 % SOLN INT 1 GTT IN OU TID  11  . telmisartan-hydrochlorothiazide (MICARDIS HCT) 80-12.5 MG tablet TAKE 1 TABLET BY MOUTH  DAILY 90 tablet 1  . cetirizine (ZYRTEC) 10 MG tablet TAKE 1 TABLET BY MOUTH DAILY 30 tablet 11  . phentermine 15 MG capsule Take 1 capsule by mouth daily.  0   No facility-administered medications prior to visit.      EXAM:  BP 100/62 (BP Location: Right Arm, Patient Position: Sitting, Cuff Size: Normal)   Pulse 75   Temp 98.2 F (36.8 C) (Oral)   Wt 220 lb 9.6 oz (100.1 kg)   BMI 36.15 kg/m   Body mass index is 36.15 kg/m.  GENERAL: vitals reviewed and listed above, alert, oriented, appears well hydrated and in no acute distress l  MS: moves all extremities without noticeable focal  abnormality PSYCH: pleasant and cooperative, no obvious depression or anxiety Lab Results  Component Value Date   WBC 4.5 07/28/2017   HGB 12.6 07/28/2017   HCT 39.1 07/28/2017   PLT 411.0 (H) 07/28/2017   GLUCOSE 126 (H) 07/28/2017   CHOL 137 11/10/2017   TRIG 81.0 11/10/2017   HDL 37.60 (L) 11/10/2017   LDLDIRECT 162.5 03/07/2013   LDLCALC 84 11/10/2017   ALT 10 07/28/2017   AST 12 07/28/2017   NA 143 07/28/2017   K 4.7 07/28/2017   CL 105 07/28/2017   CREATININE 1.02 07/28/2017   BUN 11 07/28/2017   CO2 31 07/28/2017   TSH 0.94 07/28/2017   HGBA1C 6.7 (H)  11/10/2017   MICROALBUR 0.8 07/28/2017   BP Readings from Last 3 Encounters:  11/17/17 100/62  08/18/17 108/78  07/28/17 116/78   Data reviewed Wt Readings from Last 3 Encounters:  11/17/17 220 lb 9.6 oz (100.1 kg)  08/18/17 232 lb (105.2 kg)  07/28/17  237 lb 4.8 oz (107.6 kg)     ASSESSMENT AND PLAN:  Discussed the following assessment and plan:  Diabetes mellitus without complication ([place])  Medication management  Essential hypertension  Hyperlipidemia, unspecified hyperlipidemia type All parameters  Better    Ok to stay off wellbutrin ..mood seems much better and hopeful and is on appetite suppressant .   Total visit 15 mins > 50% spent counseling and coordinating care as indicated in above note and in instructions to patient .  ROV in [place] recheck a1c at ov encourgeaged to continue lsi  Yearly due end April may  -Patient advised to return or notify health care team  if  new concerns arise.  Patient Instructions  Glad you are doing better.    Ok to stay off  Of   wellbutrin at this time .   ROV in January .     Standley Brooking. Panosh M.D.

## 2017-11-17 ENCOUNTER — Ambulatory Visit (INDEPENDENT_AMBULATORY_CARE_PROVIDER_SITE_OTHER): Payer: 59 | Admitting: Internal Medicine

## 2017-11-17 ENCOUNTER — Encounter: Payer: Self-pay | Admitting: Internal Medicine

## 2017-11-17 VITALS — BP 100/62 | HR 75 | Temp 98.2°F | Wt 220.6 lb

## 2017-11-17 DIAGNOSIS — Z79899 Other long term (current) drug therapy: Secondary | ICD-10-CM

## 2017-11-17 DIAGNOSIS — E785 Hyperlipidemia, unspecified: Secondary | ICD-10-CM

## 2017-11-17 DIAGNOSIS — I10 Essential (primary) hypertension: Secondary | ICD-10-CM | POA: Diagnosis not present

## 2017-11-17 DIAGNOSIS — E119 Type 2 diabetes mellitus without complications: Secondary | ICD-10-CM | POA: Diagnosis not present

## 2017-11-17 MED ORDER — CETIRIZINE HCL 10 MG PO TABS: ORAL_TABLET | ORAL | 11 refills | Status: DC

## 2017-11-17 NOTE — Patient Instructions (Addendum)
Glad you are doing better.    Ok to stay off  Of   wellbutrin at this time .   ROV in January .

## 2017-11-23 ENCOUNTER — Other Ambulatory Visit: Payer: Self-pay | Admitting: Internal Medicine

## 2017-11-27 DIAGNOSIS — E119 Type 2 diabetes mellitus without complications: Secondary | ICD-10-CM | POA: Diagnosis not present

## 2017-11-27 DIAGNOSIS — E669 Obesity, unspecified: Secondary | ICD-10-CM | POA: Diagnosis not present

## 2017-12-05 ENCOUNTER — Other Ambulatory Visit: Payer: Self-pay | Admitting: Internal Medicine

## 2017-12-05 DIAGNOSIS — E669 Obesity, unspecified: Secondary | ICD-10-CM | POA: Diagnosis not present

## 2017-12-05 DIAGNOSIS — I10 Essential (primary) hypertension: Secondary | ICD-10-CM | POA: Diagnosis not present

## 2017-12-11 DIAGNOSIS — E119 Type 2 diabetes mellitus without complications: Secondary | ICD-10-CM | POA: Diagnosis not present

## 2017-12-11 DIAGNOSIS — I10 Essential (primary) hypertension: Secondary | ICD-10-CM | POA: Diagnosis not present

## 2017-12-11 DIAGNOSIS — E782 Mixed hyperlipidemia: Secondary | ICD-10-CM | POA: Diagnosis not present

## 2017-12-18 ENCOUNTER — Ambulatory Visit (HOSPITAL_COMMUNITY)
Admission: EM | Admit: 2017-12-18 | Discharge: 2017-12-18 | Disposition: A | Discharge status: Home / Self Care | Payer: 59 | Attending: Family Medicine | Admitting: Family Medicine

## 2017-12-18 ENCOUNTER — Encounter (HOSPITAL_COMMUNITY): Payer: Self-pay | Admitting: Emergency Medicine

## 2017-12-18 DIAGNOSIS — E669 Obesity, unspecified: Secondary | ICD-10-CM | POA: Diagnosis not present

## 2017-12-18 DIAGNOSIS — W108XXA Fall (on) (from) other stairs and steps, initial encounter: Secondary | ICD-10-CM | POA: Diagnosis not present

## 2017-12-18 DIAGNOSIS — M79605 Pain in left leg: Secondary | ICD-10-CM

## 2017-12-18 DIAGNOSIS — E782 Mixed hyperlipidemia: Secondary | ICD-10-CM | POA: Diagnosis not present

## 2017-12-18 MED ORDER — NAPROXEN 500 MG PO TABS: 500.0000 mg | ORAL_TABLET | Freq: Two times a day (BID) | ORAL | 0 refills | Status: AC

## 2017-12-18 NOTE — ED Provider Notes (Signed)
[facility]    CSN: [id number] Arrival date & time: 12/18/17  1738     History   Chief Complaint Chief Complaint  Patient presents with  . Leg Pain    HPI Crystal Townsend is a 52 y.o. female.   Dialysis presenting with left lateral thigh pain.  She fell yesterday coming down the stairs and landed on her buttock.  Since that time she has had this pain localized to this area.  The pain is stayed the same.  She is been taking ibuprofen with little to no improvement.  She denies any radicular symptoms down to the lower leg.  She denies any history of similar pain.  She denies any swelling or bruising in the area.  She denies any trouble with walking.  The pain is exacerbated with extension of the knee.  Has not had any locking or mechanical symptoms of the knee.  HPI  Past Medical History:  Diagnosis Date  . Allergic rhinitis   . Allergy   . Anxiety   . Chest pain    Hx, no problems as of 08/10/15  . Depression   . Diabetes mellitus    Type II  . Dysmenorrhea   . Fatigue    Hx, no current problems as of 08/10/15  . Fibroids   . GERD (gastroesophageal reflux disease)    diet controlled, no meds  . Hyperlipidemia   . Hypertension   . IBS (irritable bowel syndrome)    no meds, diet controlled  . Impaired fasting glucose   . Knee pain, bilateral    Hx, no current problems as of 08/10/15  . Leukoplakia oral mucosa    2011  . Menorrhagia   . OSA (obstructive sleep apnea)    does not use cpap, never went to get machine  . Other dysfunctions of sleep stages or arousal from sleep   . Sleep apnea    Does not use CPAP  . Snoring    had a sleep study mild osa 2009    Patient Active Problem List   Diagnosis Date Noted  . Elevated platelet count 06/03/2015  . Essential hypertension 08/19/2014  . Diabetes mellitus without complication ([place]) 08/67/6195  . Hypopotassemia 07/18/2013  . Allergic rhinitis 11/20/2012  . Sleep difficulties 11/20/2012  . Diabetes mellitus,  type 2 ([place]) 09/26/2011  . Adjustment reaction with brief depressive reaction 06/06/2011  . Adjustment reaction with anxiety and depression 06/06/2011  . Multiple joint pain 06/06/2011  . Hyperlipidemia 02/21/2011  . Prediabetes 09/26/2010  . Neck pain 05/24/2010  . OBESITY 12/28/2009  . NECK PAIN 12/28/2009  . LEUKOPLAKIA OF ORAL MUCOSA INCLUDING TONGUE 08/17/2009  . NUMBNESS 04/07/2009  . HEADACHE 05/12/2008  . OBSTRUCTIVE SLEEP APNEA 05/16/2007  . OTH DYSFUNCTIONS SLEEP STAGES/AROUSAL FROM SLEEP 05/07/2007  . FATIGUE 05/07/2007  . SNORING 05/07/2007  . CHEST PAIN 05/07/2007  . HYPERTENSION 10/25/2006  . KNEE PAIN, BILATERAL 10/25/2006  . HYPERGLYCEMIA 10/25/2006  . ANXIETY 10/19/2006  . ALLERGIC RHINITIS 10/19/2006  . GERD 10/19/2006    Past Surgical History:  Procedure Laterality Date  . BREAST LUMPECTOMY WITH RADIOACTIVE SEED LOCALIZATION Right 03/05/2015   Procedure: RIGHT BREAST LUMPECTOMY WITH RADIOACTIVE SEED LOCALIZATION;  Surgeon: Erroll Luna, MD;  Location: [facility];  Service: General;  Laterality: Right;  . CESAREAN SECTION  1997  . COLONOSCOPY  2007   [facility] - polyp  . DILATION AND CURETTAGE OF UTERUS    . [place]  laparoscopy  . oral biopsy      OB History    Gravida  1   Para  1   Term      Preterm      AB      Living  1     SAB      TAB      Ectopic      Multiple      Live Births               Home Medications    Prior to Admission medications   Medication Sig Start Date End Date Taking? Authorizing Provider  acetaminophen (TYLENOL) 325 MG tablet Take 2 tablets (650 mg total) by mouth every 8 (eight) hours as needed for pain (headache). 04/23/12   Panosh, Standley Brooking, MD  aspirin 81 MG EC tablet TAKE 1 TABLET BY MOUTH EVERY DAY 03/08/16   Panosh, Standley Brooking, MD  atorvastatin (LIPITOR) 20 MG tablet TAKE 1 TABLET BY MOUTH  DAILY 11/10/17   Panosh, Standley Brooking, MD  Biotin 1000 MCG tablet Take 1,000  mcg by mouth 3 (three) times daily.    [provider]  buPROPion (WELLBUTRIN XL) 300 MG 24 hr tablet Take 1 tablet (300 mg total) by mouth daily. 07/28/17   Panosh, Standley Brooking, MD  cetirizine (ZYRTEC) 10 MG tablet TAKE 1 TABLET BY MOUTH DAILY 11/17/17   Panosh, Standley Brooking, MD  fluticasone (FLONASE) 50 MCG/ACT nasal spray 2 spray each nostril qd 06/03/15   Panosh, Standley Brooking, MD  JARDIANCE 10 MG TABS tablet TAKE 1 TABLET BY MOUTH  DAILY 11/24/17   Panosh, Standley Brooking, MD  levonorgestrel (MIRENA) 20 MCG/24HR IUD 1 each by Intrauterine route once.      [provider]  Multiple Vitamin (MULTI-VITAMIN PO) Take by mouth.    [provider]  naproxen (NAPROSYN) 500 MG tablet Take 1 tablet (500 mg total) by mouth 2 (two) times daily with a meal. 12/18/17 12/18/18  Rosemarie Ax, MD  omeprazole (PRILOSEC) 20 MG capsule Take 1 capsule (20 mg total) by mouth daily. 07/28/17   Panosh, Standley Brooking, MD  OPTIVE SENSITIVE 0.5-0.9 % SOLN INT 1 GTT IN OU TID 04/07/17   [provider]  phentermine 15 MG capsule Take 1 capsule by mouth daily. 11/08/17   [provider]  telmisartan-hydrochlorothiazide (MICARDIS HCT) 80-12.5 MG tablet TAKE 1 TABLET BY MOUTH  DAILY 12/05/17   Panosh, Standley Brooking, MD    Family History Family History  Problem Relation Age of Onset  . Lung cancer Mother   . Cancer Mother        lung   . Prostate cancer Father   . Cancer Father        prostate  . Diabetes Father   . Hyperlipidemia Father   . Hypertension Father   . Asthma Father   . Cancer Maternal Aunt        breast  . Cancer Maternal Aunt        breast  . Cancer Maternal Aunt        breast  . Colon polyps Neg Hx   . Esophageal cancer Neg Hx   . Rectal cancer Neg Hx   . Stomach cancer Neg Hx     Social History Social History   Tobacco Use  . Smoking status: Never Smoker  . Smokeless tobacco: Never Used  Substance Use Topics  . Alcohol use: Yes    Comment: social wine  .  Drug use: No      Allergies   Lisinopril and Metformin   Review of Systems Review of Systems  Constitutional: Negative for fever.  HENT: Negative for congestion.   Respiratory: Negative for cough.   Cardiovascular: Negative for chest pain.  Gastrointestinal: Negative for abdominal pain.  Musculoskeletal: Negative for gait problem.  Neurological: Negative for weakness.  Hematological: Negative for adenopathy.  Psychiatric/Behavioral: Negative for agitation.     Physical Exam Triage Vital Signs ED Triage Vitals  Enc Vitals Group     BP 12/18/17 1817 (!) 106/58     Pulse Rate 12/18/17 1817 74     Resp 12/18/17 1817 16     Temp 12/18/17 1817 98.9 F (37.2 C)     Temp src --      SpO2 12/18/17 1817 98 %     Weight --      Height --      Head Circumference --      Peak Flow --      Pain Score 12/18/17 1819 8     Pain Loc --      Pain Edu? --      Excl. in [place]? --    No data found.  Updated Vital Signs BP (!) 106/58   Pulse 74   Temp 98.9 F (37.2 C)   Resp 16   SpO2 98%   Visual Acuity Right Eye Distance:   Left Eye Distance:   Bilateral Distance:    Right Eye Near:   Left Eye Near:    Bilateral Near:     Physical Exam Gen: NAD, alert, cooperative with exam, well-appearing ENT: normal lips, normal nasal mucosa,  Eye: normal EOM, normal conjunctiva and lids CV:  no edema, +2 pedal pulses   Resp: no accessory muscle use, non-labored,  Skin: no rashes, no areas of induration  Neuro: normal tone, normal sensation to touch Psych:  normal insight, alert and oriented MSK:  Left leg: Tenderness palpation over the vastus lateralis and IT band. Normal internal and external rotation of the hip. Normal strength resistance with hip flexion, knee flexion extension, plantarflexion and dorsiflexion. Negative straight leg raise on the left. Normal gait Neurovascular intact    UC Treatments / Results  Labs (all labs ordered are listed, but only abnormal results are  displayed) Labs Reviewed - No data to display  EKG None  Radiology No results found.  Procedures Procedures (including critical care time)  Medications Ordered in UC Medications - No data to display  Initial Impression / Assessment and Plan / UC Course  I have reviewed the triage vital signs and the nursing notes.  Pertinent labs & imaging results that were available during my care of the patient were reviewed by me and considered in my medical decision making (see chart for details).     Crystal Townsend is presenting with left lateral thigh pain after a fall.  She landed on her buttock and has had this lateral thigh pain since it occurred.  Denies any radicular type symptoms going down her leg.  She is unsure if she had this area with a fall.  There is no swelling or ecchymosis over this area.  Does not appear to be fracture on exam today.  Appears to be muscular with tenderness to exam.  If not improving will consider imaging of this area.  Counseled on supportive care.  Naproxen provided.  Final Clinical Impressions(s) / UC Diagnoses   Final diagnoses:  Left leg pain  Discharge Instructions     Please try the compression on the thigh. Please take the naproxen for 10 days straight and then as needed. You can try ice or heat on the area as well. Please follow up if your symptoms do not improve.     ED Prescriptions    Medication Sig Dispense Auth. Provider   naproxen (NAPROSYN) 500 MG tablet Take 1 tablet (500 mg total) by mouth 2 (two) times daily with a meal. 30 tablet Rosemarie Ax, MD     Controlled Substance Prescriptions [place] Controlled Substance Registry consulted? Not Applicable   Rosemarie Ax, MD 12/18/17 [phone]

## 2017-12-18 NOTE — ED Triage Notes (Signed)
Pt states she fell on her stairs and "my left leg went out". Pt c/o L upper leg pain. Ambulatory.

## 2017-12-18 NOTE — Discharge Instructions (Addendum)
Please try the compression on the thigh. Please take the naproxen for 10 days straight and then as needed. You can try ice or heat on the area as well. Please follow up if your symptoms do not improve.

## 2017-12-25 DIAGNOSIS — E782 Mixed hyperlipidemia: Secondary | ICD-10-CM | POA: Diagnosis not present

## 2017-12-25 DIAGNOSIS — K5900 Constipation, unspecified: Secondary | ICD-10-CM | POA: Diagnosis not present

## 2017-12-25 DIAGNOSIS — E8881 Metabolic syndrome: Secondary | ICD-10-CM | POA: Diagnosis not present

## 2017-12-25 DIAGNOSIS — E119 Type 2 diabetes mellitus without complications: Secondary | ICD-10-CM | POA: Diagnosis not present

## 2017-12-25 DIAGNOSIS — R635 Abnormal weight gain: Secondary | ICD-10-CM | POA: Diagnosis not present

## 2018-01-01 DIAGNOSIS — E782 Mixed hyperlipidemia: Secondary | ICD-10-CM | POA: Diagnosis not present

## 2018-01-01 DIAGNOSIS — E119 Type 2 diabetes mellitus without complications: Secondary | ICD-10-CM | POA: Diagnosis not present

## 2018-01-01 DIAGNOSIS — E669 Obesity, unspecified: Secondary | ICD-10-CM | POA: Diagnosis not present

## 2018-01-08 ENCOUNTER — Other Ambulatory Visit: Payer: Self-pay | Admitting: Internal Medicine

## 2018-01-08 DIAGNOSIS — E782 Mixed hyperlipidemia: Secondary | ICD-10-CM | POA: Diagnosis not present

## 2018-01-08 DIAGNOSIS — I10 Essential (primary) hypertension: Secondary | ICD-10-CM | POA: Diagnosis not present

## 2018-01-08 DIAGNOSIS — E669 Obesity, unspecified: Secondary | ICD-10-CM | POA: Diagnosis not present

## 2018-01-15 DIAGNOSIS — I10 Essential (primary) hypertension: Secondary | ICD-10-CM | POA: Diagnosis not present

## 2018-01-15 DIAGNOSIS — E8881 Metabolic syndrome: Secondary | ICD-10-CM | POA: Diagnosis not present

## 2018-01-15 DIAGNOSIS — E669 Obesity, unspecified: Secondary | ICD-10-CM | POA: Diagnosis not present

## 2018-01-29 DIAGNOSIS — E8881 Metabolic syndrome: Secondary | ICD-10-CM | POA: Diagnosis not present

## 2018-01-29 DIAGNOSIS — E669 Obesity, unspecified: Secondary | ICD-10-CM | POA: Diagnosis not present

## 2018-03-05 DIAGNOSIS — E669 Obesity, unspecified: Secondary | ICD-10-CM | POA: Diagnosis not present

## 2018-03-05 DIAGNOSIS — E559 Vitamin D deficiency, unspecified: Secondary | ICD-10-CM | POA: Diagnosis not present

## 2018-03-29 ENCOUNTER — Telehealth: Payer: Self-pay | Admitting: Internal Medicine

## 2018-04-02 ENCOUNTER — Other Ambulatory Visit: Payer: Self-pay | Admitting: Internal Medicine

## 2018-04-03 NOTE — Telephone Encounter (Signed)
Mars with [facility] calling to find out when they can refill this for the patient. Mars can be reached at [phone], ref#: [id number]

## 2018-04-03 NOTE — Telephone Encounter (Signed)
Spoke with Herschel Senegal at [facility] and explained that medication was denied because the pt should not be due for a refill of the medication until February and it is currently too soon for the pt to have refill. Herschel Senegal states that the earliest medication would be eligible to be refilled and covered by insurance would be on Jan. 6th, 2020. Herschel Senegal states that the pt was mailed a 90 day supply in November. Informed Herschel Senegal that another request would need to be sent to the office closer to the time that refill was due.

## 2018-04-09 ENCOUNTER — Ambulatory Visit: Payer: 59 | Admitting: Family Medicine

## 2018-04-09 ENCOUNTER — Encounter: Payer: Self-pay | Admitting: Family Medicine

## 2018-04-09 ENCOUNTER — Other Ambulatory Visit: Payer: Self-pay

## 2018-04-09 VITALS — BP 138/82 | HR 102 | Temp 99.4°F | Ht 65.5 in | Wt 188.3 lb

## 2018-04-09 DIAGNOSIS — R509 Fever, unspecified: Secondary | ICD-10-CM

## 2018-04-09 DIAGNOSIS — R52 Pain, unspecified: Secondary | ICD-10-CM | POA: Diagnosis not present

## 2018-04-09 DIAGNOSIS — J0110 Acute frontal sinusitis, unspecified: Secondary | ICD-10-CM | POA: Diagnosis not present

## 2018-04-09 LAB — POCT INFLUENZA A/B
INFLUENZA A, POC: NEGATIVE
INFLUENZA B, POC: NEGATIVE

## 2018-04-09 MED ORDER — AMOXICILLIN 875 MG PO TABS: 875.0000 mg | ORAL_TABLET | Freq: Two times a day (BID) | ORAL | 0 refills | Status: DC

## 2018-04-09 MED ORDER — HYDROCOD POLST-CPM POLST ER 10-8 MG/5ML PO SUER: 5.0000 mL | Freq: Two times a day (BID) | ORAL | 0 refills | Status: DC | PRN

## 2018-04-09 NOTE — Patient Instructions (Signed)
Cough, Adult  Coughing is a reflex that clears your throat and your airways. Coughing helps to heal and protect your lungs. It is normal to cough occasionally, but a cough that happens with other symptoms or lasts a long time may be a sign of a condition that needs treatment. A cough may last only 2-3 weeks (acute), or it may last longer than 8 weeks (chronic). What are the causes? Coughing is commonly caused by:  Breathing in substances that irritate your lungs.  A viral or bacterial respiratory infection.  Allergies.  Asthma.  Postnasal drip.  Smoking.  Acid backing up from the stomach into the esophagus (gastroesophageal reflux).  Certain medicines.  Chronic lung problems, including COPD (or rarely, lung cancer).  Other medical conditions such as heart failure. Follow these instructions at home: Pay attention to any changes in your symptoms. Take these actions to help with your discomfort:  Take medicines only as told by your health care provider. ? If you were prescribed an antibiotic medicine, take it as told by your health care provider. Do not stop taking the antibiotic even if you start to feel better. ? Talk with your health care provider before you take a cough suppressant medicine.  Drink enough fluid to keep your urine clear or pale yellow.  If the air is dry, use a cold steam vaporizer or humidifier in your bedroom or your home to help loosen secretions.  Avoid anything that causes you to cough at work or at home.  If your cough is worse at night, try sleeping in a semi-upright position.  Avoid cigarette smoke. If you smoke, quit smoking. If you need help quitting, ask your health care provider.  Avoid caffeine.  Avoid alcohol.  Rest as needed. Contact a health care provider if:  You have new symptoms.  You cough up pus.  Your cough does not get better after 2-3 weeks, or your cough gets worse.  You cannot control your cough with suppressant  medicines and you are losing sleep.  You develop pain that is getting worse or pain that is not controlled with pain medicines.  You have a fever.  You have unexplained weight loss.  You have night sweats. Get help right away if:  You cough up blood.  You have difficulty breathing.  Your heartbeat is very fast. This information is not intended to replace advice given to you by your health care provider. Make sure you discuss any questions you have with your health care provider. Document Released: 09/17/2010 Document Revised: 08/27/2015 Document Reviewed: 05/28/2014 Elsevier Interactive Patient Education  2019 Elsevier Inc.  

## 2018-04-09 NOTE — Progress Notes (Signed)
Subjective:     Patient ID: Crystal Townsend, female   DOB: 05-May-1965, 53 y.o.   MRN: [id number]  HPI   Patient is seen with upper respiratory illness.  She states that she first got sick before Christmas and seemed to be improving slightly but then relapsed with worsening symptoms last Friday.  She has had some bilateral earache, chills, decreased appetite, myalgias, nasal congestion, and dry cough.  She has history of some stress urine incontinence which has been exacerbated by her coughing.  She is had some increased frontal sinus pressure.  She took some NyQuil last night which did not help much with her cough.  She did have flu vaccine through work.  No known sick contacts.  Cough severe at night and interfering with sleep.  Past Medical History:  Diagnosis Date  . Allergic rhinitis   . Allergy   . Anxiety   . Chest pain    Hx, no problems as of 08/10/15  . Depression   . Diabetes mellitus    Type II  . Dysmenorrhea   . Fatigue    Hx, no current problems as of 08/10/15  . Fibroids   . GERD (gastroesophageal reflux disease)    diet controlled, no meds  . Hyperlipidemia   . Hypertension   . IBS (irritable bowel syndrome)    no meds, diet controlled  . Impaired fasting glucose   . Knee pain, bilateral    Hx, no current problems as of 08/10/15  . Leukoplakia oral mucosa    2011  . Menorrhagia   . OSA (obstructive sleep apnea)    does not use cpap, never went to get machine  . Other dysfunctions of sleep stages or arousal from sleep   . Sleep apnea    Does not use CPAP  . Snoring    had a sleep study mild osa 2009   Past Surgical History:  Procedure Laterality Date  . BREAST LUMPECTOMY WITH RADIOACTIVE SEED LOCALIZATION Right 03/05/2015   Procedure: RIGHT BREAST LUMPECTOMY WITH RADIOACTIVE SEED LOCALIZATION;  Surgeon: Erroll Luna, MD;  Location: [facility];  Service: General;  Laterality: Right;  . CESAREAN SECTION  1997  . COLONOSCOPY  2007   [facility] - polyp   . DILATION AND CURETTAGE OF UTERUS    . ECTOPIC PREGNANCY SURGERY  2000   laparoscopy  . oral biopsy      reports that she has never smoked. She has never used smokeless tobacco. She reports current alcohol use. She reports that she does not use drugs. family history includes Asthma in her father; Cancer in her father, maternal aunt, maternal aunt, maternal aunt, and mother; Diabetes in her father; Hyperlipidemia in her father; Hypertension in her father; Lung cancer in her mother; Prostate cancer in her father. Allergies  Allergen Reactions  . Lisinopril     REACTION: throat epiglottal? swelling ? from ace  . Dr Constance Holster evaluation.  10.09  . Metformin     REACTION: severe nausea with 500 mg  Immediate release      Review of Systems  Constitutional: Positive for appetite change, chills and fatigue. Negative for fever.  HENT: Positive for congestion and ear pain.   Respiratory: Positive for cough. Negative for shortness of breath.   Cardiovascular: Negative for chest pain.  Neurological: Positive for headaches.       Objective:   Physical Exam Constitutional:      Appearance: Normal appearance.  HENT:     Right Ear:  Tympanic membrane normal.     Left Ear: Tympanic membrane normal.     Mouth/Throat:     Pharynx: Oropharynx is clear. No oropharyngeal exudate or posterior oropharyngeal erythema.  Neck:     Musculoskeletal: Neck supple.  Cardiovascular:     Rate and Rhythm: Normal rate and regular rhythm.  Pulmonary:     Effort: Pulmonary effort is normal.     Breath sounds: Normal breath sounds. No wheezing or rales.  Neurological:     Mental Status: She is alert.        Assessment:     Upper respiratory infection.  Influenza screen negative.  She has had worsening of sinusitis symptoms after some initial improvement.    Plan:     -Start amoxicillin 875 mg twice daily for 10 days -Limited Tussionex 1 teaspoon nightly for severe cough number 60 mL's with no  refill-she knows not to drive or operate machinery while taking this.  She is aware of significant sedation risk with this. -Work note was written from today through Wednesday.  Eulas Post MD [facility] at [facility]

## 2018-04-13 ENCOUNTER — Other Ambulatory Visit: Payer: Self-pay | Admitting: Internal Medicine

## 2018-04-13 DIAGNOSIS — H04123 Dry eye syndrome of bilateral lacrimal glands: Secondary | ICD-10-CM | POA: Diagnosis not present

## 2018-04-13 DIAGNOSIS — H2513 Age-related nuclear cataract, bilateral: Secondary | ICD-10-CM | POA: Diagnosis not present

## 2018-04-13 DIAGNOSIS — E119 Type 2 diabetes mellitus without complications: Secondary | ICD-10-CM | POA: Diagnosis not present

## 2018-04-13 LAB — HM DIABETES EYE EXAM

## 2018-04-16 DIAGNOSIS — Z6831 Body mass index (BMI) 31.0-31.9, adult: Secondary | ICD-10-CM | POA: Diagnosis not present

## 2018-04-16 DIAGNOSIS — I10 Essential (primary) hypertension: Secondary | ICD-10-CM | POA: Diagnosis not present

## 2018-04-18 ENCOUNTER — Encounter: Payer: Self-pay | Admitting: Internal Medicine

## 2018-04-18 NOTE — Progress Notes (Signed)
Chief Complaint  Patient presents with  . Diabetes    Pt states that blood sugar has been under 100 in the morning     HPI: Crystal Townsend 53 y.o. come in for  Fu   Dm etc   Has lost weight   Diet changes and exercise .   blu sky  Also getting b12 inj ocass phentermine.   Forgot to take asa asks for refills for flex spending    Right knee let cramps sometimes  Took ibuprofen with help 800.    Recovering from sinus infection and bad cough  tussionex helps at night almost out.   ( has stress incontinence if bad  coughing asks for refill . No fever   Is much better   ROS: See pertinent positives and negatives per HPI. No cp sob   Past Medical History:  Diagnosis Date  . Allergic rhinitis   . Allergy   . Anxiety   . Chest pain    Hx, no problems as of 08/10/15  . Depression   . Diabetes mellitus    Type II  . Dysmenorrhea   . Fatigue    Hx, no current problems as of 08/10/15  . Fibroids   . GERD (gastroesophageal reflux disease)    diet controlled, no meds  . Hyperlipidemia   . Hypertension   . IBS (irritable bowel syndrome)    no meds, diet controlled  . Impaired fasting glucose   . Knee pain, bilateral    Hx, no current problems as of 08/10/15  . Leukoplakia oral mucosa    2011  . Menorrhagia   . OSA (obstructive sleep apnea)    does not use cpap, never went to get machine  . Other dysfunctions of sleep stages or arousal from sleep   . Sleep apnea    Does not use CPAP  . Snoring    had a sleep study mild osa 2009    Family History  Problem Relation Age of Onset  . Lung cancer Mother   . Cancer Mother        lung   . Prostate cancer Father   . Cancer Father        prostate  . Diabetes Father   . Hyperlipidemia Father   . Hypertension Father   . Asthma Father   . Cancer Maternal Aunt        breast  . Cancer Maternal Aunt        breast  . Cancer Maternal Aunt        breast  . Colon polyps Neg Hx   . Esophageal cancer Neg Hx   . Rectal cancer Neg Hx    . Stomach cancer Neg Hx     Social History   Socioeconomic History  . Marital status: Married    Spouse name: Not on file  . Number of children: Not on file  . Years of education: Not on file  . Highest education level: Not on file  Occupational History  . Not on file  Social Needs  . Financial resource strain: Not on file  . Food insecurity:    Worry: Not on file    Inability: Not on file  . Transportation needs:    Medical: Not on file    Non-medical: Not on file  Tobacco Use  . Smoking status: Never Smoker  . Smokeless tobacco: Never Used  Substance and Sexual Activity  . Alcohol use: Yes    Comment:  social wine  . Drug use: No  . Sexual activity: Not Currently    Birth control/protection: I.U.D.    Comment: mirena - inserted in 2015  Lifestyle  . Physical activity:    Days per week: Not on file    Minutes per session: Not on file  . Stress: Not on file  Relationships  . Social connections:    Talks on phone: Not on file    Gets together: Not on file    Attends religious service: Not on file    Active member of club or organization: Not on file    Attends meetings of clubs or organizations: Not on file    Relationship status: Not on file  Other Topics Concern  . Not on file  Social History Narrative   Married husband left suddenly   Single   Customer service rep    [employer] has graduated and still works   Son 19 doing ok working retail    Outpatient Medications Prior to Visit  Medication Sig Dispense Refill  . acetaminophen (TYLENOL) 325 MG tablet Take 2 tablets (650 mg total) by mouth every 8 (eight) hours as needed for pain (headache). 100 tablet 2  . atorvastatin (LIPITOR) 20 MG tablet TAKE 1 TABLET BY MOUTH  DAILY 90 tablet 0  . Biotin 1000 MCG tablet Take 1,000 mcg by mouth 3 (three) times daily.    . chlorpheniramine-HYDROcodone (TUSSIONEX PENNKINETIC ER) 10-8 MG/5ML SUER Take 5 mLs by mouth every 12 (twelve) hours as needed for cough.  60 mL 0  . fluticasone (FLONASE) 50 MCG/ACT nasal spray 2 spray each nostril qd 16 g 11  . JARDIANCE 10 MG TABS tablet TAKE 1 TABLET BY MOUTH  DAILY 90 tablet 1  . levonorgestrel (MIRENA) 20 MCG/24HR IUD 1 each by Intrauterine route once.      . Multiple Vitamin (MULTI-VITAMIN PO) Take by mouth.    Meribeth Mattes SENSITIVE 0.5-0.9 % SOLN INT 1 GTT IN OU TID  11  . phentermine 15 MG capsule Take 1 capsule by mouth daily.  0  . telmisartan-hydrochlorothiazide (MICARDIS HCT) 80-12.5 MG tablet TAKE 1 TABLET BY MOUTH  DAILY 90 tablet 1  . aspirin 81 MG EC tablet TAKE 1 TABLET BY MOUTH EVERY DAY 100 tablet 0  . cetirizine (ZYRTEC) 10 MG tablet TAKE 1 TABLET BY MOUTH DAILY 30 tablet 11  . buPROPion (WELLBUTRIN XL) 300 MG 24 hr tablet Take 1 tablet (300 mg total) by mouth daily. (Patient not taking: Reported on 04/19/2018) 90 tablet 3  . naproxen (NAPROSYN) 500 MG tablet Take 1 tablet (500 mg total) by mouth 2 (two) times daily with a meal. (Patient not taking: Reported on 04/19/2018) 30 tablet 0  . omeprazole (PRILOSEC) 20 MG capsule Take 1 capsule (20 mg total) by mouth daily. (Patient not taking: Reported on 04/19/2018) 30 capsule 1  . amoxicillin (AMOXIL) 875 MG tablet Take 1 tablet (875 mg total) by mouth 2 (two) times daily. 20 tablet 0   No facility-administered medications prior to visit.      EXAM:  BP 122/70 (BP Location: Right Arm, Patient Position: Sitting, Cuff Size: Large)   Pulse 60   Temp 98.6 F (37 C) (Oral)   Ht 5\' 6"  (1.676 m)   Wt 190 lb 8 oz (86.4 kg)   BMI 30.75 kg/m   Body mass index is 30.75 kg/m.  GENERAL: vitals reviewed and listed above, alert, oriented, appears well hydrated and in no acute distress  HEENT: atraumatic, conjunctiva  clear, no obvious abnormalities on inspection of external nose and ears NECK: no obvious masses on inspection palpation  LUNGS: clear to auscultation bilaterally, no wheezes, rales or rhonchi, good air movement CV: HRRR, no clubbing cyanosis  or  peripheral edema nl cap refill  MS: moves all extremities without noticeable focal  abnormality PSYCH: pleasant and cooperative, no obvious depression or anxiety Lab Results  Component Value Date   WBC 4.5 07/28/2017   HGB 12.6 07/28/2017   HCT 39.1 07/28/2017   PLT 411.0 (H) 07/28/2017   GLUCOSE 126 (H) 07/28/2017   CHOL 137 11/10/2017   TRIG 81.0 11/10/2017   HDL 37.60 (L) 11/10/2017   LDLDIRECT 162.5 03/07/2013   LDLCALC 84 11/10/2017   ALT 10 07/28/2017   AST 12 07/28/2017   NA 143 07/28/2017   K 4.7 07/28/2017   CL 105 07/28/2017   CREATININE 1.02 07/28/2017   BUN 11 07/28/2017   CO2 31 07/28/2017   TSH 0.94 07/28/2017   HGBA1C 5.4 04/19/2018   MICROALBUR 0.8 07/28/2017   BP Readings from Last 3 Encounters:  04/19/18 122/70  04/09/18 138/82  12/18/17 (!) 106/58   Wt Readings from Last 3 Encounters:  04/19/18 190 lb 8 oz (86.4 kg)  04/09/18 188 lb 4.8 oz (85.4 kg)  11/17/17 220 lb 9.6 oz (100.1 kg)    ASSESSMENT AND PLAN:  Discussed the following assessment and plan:  Diabetes mellitus without complication (HCC) - super reduction in  a1c poct  after weight loss  and exercise  continue for now fu in may cpx - Plan: Basic metabolic panel, CBC with Differential/Platelet, Hemoglobin A1c, Hepatic function panel, Lipid panel, TSH, Microalbumin / creatinine urine ratio, POCT glycosylated hemoglobin (Hb A1C)  Medication management - Plan: Basic metabolic panel, CBC with Differential/Platelet, Hemoglobin A1c, Hepatic function panel, Lipid panel, TSH  Hyperlipidemia, unspecified hyperlipidemia type - Plan: Basic metabolic panel, CBC with Differential/Platelet, Hemoglobin A1c, Hepatic function panel, Lipid panel, TSH  Essential hypertension - Plan: Basic metabolic panel, CBC with Differential/Platelet, Hemoglobin A1c, Hepatic function panel, Lipid panel, TSH  Cough, persistent  BMI 30.0-30.9,adult - continue  healthy weight loss Much improved with weight loss  Continue lifestyle intervention healthy eating and exercise . Stay on jardiance  for now  Cough med with caution at night .   Poss knee issue right   Ice after activity   Fu smhere or  ortho if  persistent or progressive  cpx in may lab previsit  -Patient advised to return or notify health care team  if  new concerns arise.  Patient Instructions   dont take asa or  Ibuprofen.   At the same time risk of bleeding  Glad  you are  Doing better .  Caution with cough med .   Due for fulll cpx  In may 2020    Get labs pre visit   a1c is great   Wt Readings from Last 3 Encounters:  04/19/18 190 lb 8 oz (86.4 kg)  04/09/18 188 lb 4.8 oz (85.4 kg)  11/17/17 220 lb 9.6 oz (100.1 kg)        Crystal Townsend K. Deuce Paternoster M.D.

## 2018-04-19 ENCOUNTER — Ambulatory Visit (INDEPENDENT_AMBULATORY_CARE_PROVIDER_SITE_OTHER): Payer: 59 | Admitting: Internal Medicine

## 2018-04-19 ENCOUNTER — Encounter: Payer: Self-pay | Admitting: Internal Medicine

## 2018-04-19 VITALS — BP 122/70 | HR 60 | Temp 98.6°F | Ht 66.0 in | Wt 190.5 lb

## 2018-04-19 DIAGNOSIS — E785 Hyperlipidemia, unspecified: Secondary | ICD-10-CM | POA: Diagnosis not present

## 2018-04-19 DIAGNOSIS — R05 Cough: Secondary | ICD-10-CM

## 2018-04-19 DIAGNOSIS — E119 Type 2 diabetes mellitus without complications: Secondary | ICD-10-CM | POA: Diagnosis not present

## 2018-04-19 DIAGNOSIS — I10 Essential (primary) hypertension: Secondary | ICD-10-CM

## 2018-04-19 DIAGNOSIS — Z6830 Body mass index (BMI) 30.0-30.9, adult: Secondary | ICD-10-CM

## 2018-04-19 DIAGNOSIS — R053 Chronic cough: Secondary | ICD-10-CM

## 2018-04-19 DIAGNOSIS — Z79899 Other long term (current) drug therapy: Secondary | ICD-10-CM | POA: Diagnosis not present

## 2018-04-19 LAB — POCT GLYCOSYLATED HEMOGLOBIN (HGB A1C): Hemoglobin A1C: 5.4 % (ref 4.0–5.6)

## 2018-04-19 MED ORDER — CETIRIZINE HCL 10 MG PO TABS: ORAL_TABLET | ORAL | 11 refills | Status: DC

## 2018-04-19 MED ORDER — HYDROCODONE-HOMATROPINE 5-1.5 MG/5ML PO SYRP: 5.0000 mL | ORAL_SOLUTION | Freq: Three times a day (TID) | ORAL | 0 refills | Status: DC | PRN

## 2018-04-19 MED ORDER — ASPIRIN 81 MG PO TBEC: 81.0000 mg | DELAYED_RELEASE_TABLET | Freq: Every day | ORAL | 0 refills | Status: DC

## 2018-04-19 NOTE — Patient Instructions (Addendum)
dont take asa or  Ibuprofen.   At the same time risk of bleeding  Glad  you are  Doing better .  Caution with cough med .   Due for fulll cpx  In may 2020    Get labs pre visit   a1c is great   Wt Readings from Last 3 Encounters:  04/19/18 190 lb 8 oz (86.4 kg)  04/09/18 188 lb 4.8 oz (85.4 kg)  11/17/17 220 lb 9.6 oz (100.1 kg)

## 2018-06-06 LAB — HM PAP SMEAR

## 2018-06-12 ENCOUNTER — Encounter: Payer: Self-pay | Admitting: Internal Medicine

## 2018-06-24 ENCOUNTER — Other Ambulatory Visit: Payer: Self-pay | Admitting: Internal Medicine

## 2018-07-17 ENCOUNTER — Ambulatory Visit: Payer: 59 | Admitting: Obstetrics & Gynecology

## 2018-07-31 ENCOUNTER — Ambulatory Visit: Payer: Self-pay | Admitting: Internal Medicine

## 2018-07-31 ENCOUNTER — Ambulatory Visit (INDEPENDENT_AMBULATORY_CARE_PROVIDER_SITE_OTHER): Payer: 59 | Admitting: Internal Medicine

## 2018-07-31 ENCOUNTER — Encounter: Payer: Self-pay | Admitting: Internal Medicine

## 2018-07-31 ENCOUNTER — Other Ambulatory Visit: Payer: Self-pay

## 2018-07-31 DIAGNOSIS — M792 Neuralgia and neuritis, unspecified: Secondary | ICD-10-CM

## 2018-07-31 DIAGNOSIS — E118 Type 2 diabetes mellitus with unspecified complications: Secondary | ICD-10-CM

## 2018-07-31 DIAGNOSIS — M542 Cervicalgia: Secondary | ICD-10-CM

## 2018-07-31 DIAGNOSIS — M79603 Pain in arm, unspecified: Secondary | ICD-10-CM

## 2018-07-31 MED ORDER — GABAPENTIN 300 MG PO CAPS: 300.0000 mg | ORAL_CAPSULE | Freq: Every day | ORAL | 1 refills | Status: DC

## 2018-07-31 NOTE — Progress Notes (Signed)
Virtual Visit via Video Note  I connected with@ on 07/31/18 at 10:15 AM EDT by a video enabled telemedicine application and verified that I am speaking with the correct person using two identifiers. Location patient: home Location provider:work   office Persons participating in the virtual visit: patient, provider  [place] national recommendations  regarding COVID 19 pandemic   video visit is advised over in office visit for this patient.  Discussed the limitations of evaluation and management by telemedicine and  availability of in person appointments. The patient expressed understanding and agreed to proceed.   HPI: Crystal Townsend presents for VV for ongoing problem that has been worsening over the last 3 nights  . See   Nurse triage .  She is right-hand dominant and is noted off and on for the last 3 to 4 months that is been worsening over the last 3 nights waking her up of right more than left numb searing pain down her right arm that wakes her up from sleep after a couple hours she has to get up and move around or lay her arm over the side of the bed dependent to make it go away.  No color or temperature change.  It happens on either side but mostly on the right.  She has no chronic neck pain but does have occasional sharp shooting electric shock time lightening bolt type pain that last 3 to 4 seconds. Her arms do not bother her when she is working during the day taking a walk general physical activities. Denies any specific injury or other neurologic signs She did see orthopedics sometime in March for right shoulder pain and was given a steroid injection with help improve this that pain is different than this. Denies any fever or unusual cough systemic symptoms.  No other neurologic symptoms. Her blood sugars have been excellent. She states that she sleeps on her back mostly with her arms across body or outside.   ROS: See pertinent positives and negatives per HPI.  Past Medical  History:  Diagnosis Date  . Allergic rhinitis   . Allergy   . Anxiety   . Chest pain    Hx, no problems as of 08/10/15  . Depression   . Diabetes mellitus    Type II  . Dysmenorrhea   . Fatigue    Hx, no current problems as of 08/10/15  . Fibroids   . GERD (gastroesophageal reflux disease)    diet controlled, no meds  . Hyperlipidemia   . Hypertension   . IBS (irritable bowel syndrome)    no meds, diet controlled  . Impaired fasting glucose   . Knee pain, bilateral    Hx, no current problems as of 08/10/15  . Leukoplakia oral mucosa    2011  . Menorrhagia   . OSA (obstructive sleep apnea)    does not use cpap, never went to get machine  . Other dysfunctions of sleep stages or arousal from sleep   . Sleep apnea    Does not use CPAP  . Snoring    had a sleep study mild osa 2009    Past Surgical History:  Procedure Laterality Date  . BREAST LUMPECTOMY WITH RADIOACTIVE SEED LOCALIZATION Right 03/05/2015   Procedure: RIGHT BREAST LUMPECTOMY WITH RADIOACTIVE SEED LOCALIZATION;  Surgeon: Erroll Luna, MD;  Location: [facility];  Service: General;  Laterality: Right;  . CESAREAN SECTION  1997  . COLONOSCOPY  2007   [facility] - polyp  . DILATION  AND CURETTAGE OF UTERUS    . ECTOPIC PREGNANCY SURGERY  2000   laparoscopy  . oral biopsy      Family History  Problem Relation Age of Onset  . Lung cancer Mother   . Cancer Mother        lung   . Prostate cancer Father   . Cancer Father        prostate  . Diabetes Father   . Hyperlipidemia Father   . Hypertension Father   . Asthma Father   . Cancer Maternal Aunt        breast  . Cancer Maternal Aunt        breast  . Cancer Maternal Aunt        breast  . Colon polyps Neg Hx   . Esophageal cancer Neg Hx   . Rectal cancer Neg Hx   . Stomach cancer Neg Hx     Social History   Tobacco Use  . Smoking status: Never Smoker  . Smokeless tobacco: Never Used  Substance Use Topics  . Alcohol use: Yes     Comment: social wine  . Drug use: No      Current Outpatient Medications:  .  acetaminophen (TYLENOL) 325 MG tablet, Take 2 tablets (650 mg total) by mouth every 8 (eight) hours as needed for pain (headache)., Disp: 100 tablet, Rfl: 2 .  aspirin 81 MG EC tablet, Take 1 tablet (81 mg total) by mouth daily. Swallow whole., Disp: 100 tablet, Rfl: 0 .  atorvastatin (LIPITOR) 20 MG tablet, TAKE 1 TABLET BY MOUTH  DAILY, Disp: 90 tablet, Rfl: 0 .  Biotin 1000 MCG tablet, Take 1,000 mcg by mouth 3 (three) times daily., Disp: , Rfl:  .  buPROPion (WELLBUTRIN XL) 300 MG 24 hr tablet, Take 1 tablet (300 mg total) by mouth daily. (Patient not taking: Reported on 04/19/2018), Disp: 90 tablet, Rfl: 3 .  cetirizine (ZYRTEC) 10 MG tablet, TAKE 1 TABLET BY MOUTH DAILY, Disp: 30 tablet, Rfl: 11 .  chlorpheniramine-HYDROcodone (TUSSIONEX PENNKINETIC ER) 10-8 MG/5ML SUER, Take 5 mLs by mouth every 12 (twelve) hours as needed for cough., Disp: 60 mL, Rfl: 0 .  fluticasone (FLONASE) 50 MCG/ACT nasal spray, 2 spray each nostril qd, Disp: 16 g, Rfl: 11 .  gabapentin (NEURONTIN) 300 MG capsule, Take 1 capsule (300 mg total) by mouth at bedtime., Disp: 30 capsule, Rfl: 1 .  HYDROcodone-homatropine (HYCODAN) 5-1.5 MG/5ML syrup, Take 5 mLs by mouth every 8 (eight) hours as needed for cough. At night, Disp: 90 mL, Rfl: 0 .  JARDIANCE 10 MG TABS tablet, TAKE 1 TABLET BY MOUTH  DAILY, Disp: 90 tablet, Rfl: 1 .  levonorgestrel (MIRENA) 20 MCG/24HR IUD, 1 each by Intrauterine route once.  , Disp: , Rfl:  .  Multiple Vitamin (MULTI-VITAMIN PO), Take by mouth., Disp: , Rfl:  .  naproxen (NAPROSYN) 500 MG tablet, Take 1 tablet (500 mg total) by mouth 2 (two) times daily with a meal. (Patient not taking: Reported on 04/19/2018), Disp: 30 tablet, Rfl: 0 .  omeprazole (PRILOSEC) 20 MG capsule, Take 1 capsule (20 mg total) by mouth daily. (Patient not taking: Reported on 04/19/2018), Disp: 30 capsule, Rfl: 1 .  OPTIVE SENSITIVE 0.5-0.9  % SOLN, INT 1 GTT IN OU TID, Disp: , Rfl: 11 .  phentermine 15 MG capsule, Take 1 capsule by mouth daily., Disp: , Rfl: 0 .  telmisartan-hydrochlorothiazide (MICARDIS HCT) 80-12.5 MG tablet, TAKE 1 TABLET BY MOUTH  DAILY,  Disp: 90 tablet, Rfl: 1  EXAM: BP Readings from Last 3 Encounters:  04/19/18 122/70  04/09/18 138/82  12/18/17 (!) 106/58    VITALS per patient if applicable: looks well and nl rom and  Position   GENERAL: alert, oriented, appears well and in no acute distress  HEENT: atraumatic, conjunttiva clear, no obvious abnormalities on inspection of external nose and ears  NECK: normal movements of the head and neck no masses palpated in the supraclavicular area according to patient.  She is able to move her neck freely today.  No limitation of movement of her arms at this time.  LUNGS: on inspection no signs of respiratory distress, breathing rate appears normal, no obvious gross SOB, gasping or wheezing  CV: no obvious cyanosis  MS: moves all visible extremities without noticeable abnormality  PSYCH/NEURO: pleasant and cooperative, no obvious depression or anxiety, speech and thought processing grossly intact Lab Results  Component Value Date   WBC 4.5 07/28/2017   HGB 12.6 07/28/2017   HCT 39.1 07/28/2017   PLT 411.0 (H) 07/28/2017   GLUCOSE 126 (H) 07/28/2017   CHOL 137 11/10/2017   TRIG 81.0 11/10/2017   HDL 37.60 (L) 11/10/2017   LDLDIRECT 162.5 03/07/2013   LDLCALC 84 11/10/2017   ALT 10 07/28/2017   AST 12 07/28/2017   NA 143 07/28/2017   K 4.7 07/28/2017   CL 105 07/28/2017   CREATININE 1.02 07/28/2017   BUN 11 07/28/2017   CO2 31 07/28/2017   TSH 0.94 07/28/2017   HGBA1C 5.4 04/19/2018   MICROALBUR 0.8 07/28/2017    ASSESSMENT AND PLAN:  Discussed the following assessment and plan:  Pain of upper extremity, unspecified laterality - Plan: DG Cervical Spine Complete, DG Chest 2 View  Neck pain - Plan: DG Cervical Spine Complete, DG Chest 2  View  Nerve pain - Plan: DG Cervical Spine Complete, DG Chest 2 View  Type 2 diabetes mellitus with complication, without long-term current use of insulin (HCC) - doing very welll  controlled  Nerve pain  Atypical timing   Awakening  Nocturnal ? If positional compression triggered  but seems radicular  And  Without sudden weakness   Right  Mostly But ocass left   Wonders circulation but  Doesn't seem  Typical of such  .   No other alarm sx  Progressive sx    At this t ime   X ray c spine and  Chest   Sent to w wend over GI And gabapentin trial at night   And then  Let us know how doing next week and consider  neuro referral and/ or mri spine  Exam in person or other .    Counseled.   Expectant management and discussion of plan and treatment with patient with opportunity to ask questions and all were answered. The patient agreed with the plan and demonstrated an understanding of the instructions.   The patient was advised to call back or seek an in-person evaluation if worsening  or having concerns . In the interim .     Shanon Ace, MD

## 2018-07-31 NOTE — Telephone Encounter (Signed)
Pt. Reports she noticed 4 months ago, pain to both arms that would wake her up at night. Has become every night now and she has numbness and tingling to both arms. Doesn't notice this during the day. Warm transfer to [facility] in the practice for virtual visit.  Answer Assessment - Initial Assessment Questions 1. ONSET: "When did the pain start?"     4 months ago 2. LOCATION: "Where is the pain located?"     Both arms - pain and "tingly" 3. PAIN: "How bad is the pain?" (Scale 1-10; or mild, moderate, severe)   - MILD (1-3): doesn't interfere with normal activities   - MODERATE (4-7): interferes with normal activities (e.g., work or school) or awakens from sleep   - SEVERE (8-10): excruciating pain, unable to do any normal activities, unable to hold a cup of water     10 4. WORK OR EXERCISE: "Has there been any recent work or exercise that involved this part of the body?"     No 5. CAUSE: "What do you think is causing the arm pain?"     Unsure 6. OTHER SYMPTOMS: "Do you have any other symptoms?" (e.g., neck pain, swelling, rash, fever, numbness, weakness)     Numbness 7. PREGNANCY: "Is there any chance you are pregnant?" "When was your last menstrual period?"     No  Protocols used: ARM PAIN-A-AH

## 2018-08-02 ENCOUNTER — Telehealth: Payer: Self-pay | Admitting: *Deleted

## 2018-08-02 ENCOUNTER — Other Ambulatory Visit: Payer: 59

## 2018-08-02 NOTE — Telephone Encounter (Signed)
Copied from [place] [id number]. Topic: Appointment Scheduling - Scheduling Inquiry for Clinic >> Aug 02, 2018  7:48 AM Rayann Heman wrote: Reason for CRM:pt called and stated that she would like to schedule labs before CPE. Please advise

## 2018-08-02 NOTE — Telephone Encounter (Signed)
Okay to make appt future lab orders are already dropped

## 2018-08-13 ENCOUNTER — Encounter: Payer: 59 | Admitting: Internal Medicine

## 2018-08-31 ENCOUNTER — Other Ambulatory Visit: Payer: Self-pay | Admitting: Internal Medicine

## 2018-09-18 ENCOUNTER — Other Ambulatory Visit: Payer: Self-pay | Admitting: Internal Medicine

## 2018-11-26 NOTE — Progress Notes (Signed)
Chief Complaint  Patient presents with   Annual Exam    no concerns    Medication Management   Diabetes   Hypertension    HPI: Patient  Crystal Townsend  53 y.o. comes in today for Preventive Health Care visit  And Chronic disease management BP controlled needs refill meds DM jardiance  Doing fine  maintaining weight loss Allergies need refill zyrtec  Mood no longer on meds doing ok HLD meds   Health Maintenance  Topic Date Due   Hepatitis C Screening  1966/02/24   HIV Screening  09/15/1978   COLONOSCOPY  08/06/2015   FOOT EXAM  07/29/2018   HEMOGLOBIN A1C  10/18/2018   INFLUENZA VACCINE  11/03/2018   OPHTHALMOLOGY EXAM  04/14/2019   MAMMOGRAM  06/03/2019   PAP SMEAR-Modifier  06/05/2021   TETANUS/TDAP  10/19/2025   PNEUMOCOCCAL POLYSACCHARIDE VACCINE AGE 28-64 HIGH RISK  Completed   Health Maintenance Review LIFESTYLE:  Exercise:   Yes  Tobacco/ETS: no Alcohol:  no Sugar beverages:sweet tea ocass  Sleep: 8 plus every day walking   Active  Drug use: no HH of 1 Work:    At home   8 hours day    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 GI /GU: No adominal pain, vomiting, change in bowel habits. No blood in the stool. No significant GU symptoms. SKIN/HEME: ,no acute skin rashes suspicious lesions or bleeding. No lymphadenopathy, nodules, masses.  NEURO/ PSYCH:  No neurologic signs such as weakness numbness. No depression anxiety. IMM/ Allergy: No unusual infections.  Allergy .   REST of 12 system review negative except as per HPI   Past Medical History:  Diagnosis Date   Allergic rhinitis    Allergy      Anxiety    Chest pain    Hx, no problems as of 08/10/15   Depression    Diabetes mellitus    Type II   Dysmenorrhea    Fatigue    Hx, no current problems as of 08/10/15   Fibroids    GERD (gastroesophageal reflux disease)    diet controlled, no meds   Hyperlipidemia    Hypertension    IBS (irritable bowel syndrome)    no meds, diet controlled   Impaired fasting glucose    Knee pain, bilateral    Hx, no current problems as of 08/10/15   Leukoplakia oral mucosa    2011   Menorrhagia    OSA (obstructive sleep apnea)    does not use cpap, never went to get machine   Other dysfunctions of sleep stages or arousal from sleep    Sleep apnea    Does not use CPAP   Snoring    had a sleep study mild [place] 2009    Past Surgical History:  Procedure Laterality Date   BREAST LUMPECTOMY WITH RADIOACTIVE SEED LOCALIZATION Right 03/05/2015   Procedure: RIGHT BREAST LUMPECTOMY WITH RADIOACTIVE SEED LOCALIZATION;  Surgeon: Erroll Luna, MD;  Location: [facility];  Service: General;  Laterality: Right;   [place]   COLONOSCOPY  2007  [facility] - polyp   DILATION AND CURETTAGE OF UTERUS     ECTOPIC PREGNANCY SURGERY  2000   laparoscopy   oral biopsy      Family History  Problem Relation Age of Onset   Lung cancer Mother    Cancer Mother        lung    Prostate cancer Father    Cancer Father        prostate   Diabetes Father    Hyperlipidemia Father    Hypertension Father    Asthma Father    Cancer Maternal Aunt        breast   Cancer Maternal Aunt        breast   Cancer Maternal Aunt        breast   Colon polyps Neg Hx    Esophageal cancer Neg Hx    Rectal cancer Neg Hx    Stomach cancer Neg Hx     Social History   Socioeconomic History   Marital status: Married    Spouse name: Not on file   Number of children: Not on file   Years of education: Not on file   Highest education level: Not on file   Occupational History   Not on file  Social Needs   Financial resource strain: Not on file   Food insecurity    Worry: Not on file    Inability: Not on file   Transportation needs    Medical: Not on file    Non-medical: Not on file  Tobacco Use   Smoking status: Never Smoker   Smokeless tobacco: Never Used  Substance and Sexual Activity   Alcohol use: Yes    Comment: social wine   Drug use: No   Sexual activity: Not Currently    Birth control/protection: I.U.D.    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 Wt Readings from Last 3 Encounters:  11/27/18 185 lb (83.9 kg)  04/19/18 190 lb 8 oz (86.4 kg)  04/09/18 188 lb 4.8 oz (85.4 kg)    Physical Exam: Vital signs reviewed [id number] is a well-developed well-nourished alert cooperative    who appearsr stated age in no acute distress.  HEENT: normocephalic atraumatic , Eyes: PERRL EOM's full, conjunctiva clear, Nares: paten,t no deformity  discharge or tenderness., Ears: no deformity EAC's clear TMs with normal landmarks. Mouth: clear OP,masked  NECK:  supple without masses, thyromegaly or bruits. thyroid palpable   CHEST/PULM:  Clear to auscultation and percussion breath sounds equal no wheeze , rales or rhonchi. No chest wall deformities or tenderness.  CV: PMI is nondisplaced, S1 S2 no gallops, murmurs, rubs. Peripheral pulses are full without delay.No JVD .  ABDOMEN: Bowel sounds normal nontender  No guard or rebound, no hepato splenomegal no CVA tenderness.  No hernia. Extremtities:  No clubbing cyanosis or edema, no acute joint swelling or redness no focal atrophy NEURO:  Oriented x3, cranial nerves 3-12 appear to be intact, no obvious focal weakness,gait within normal limits no abnormal reflexes or asymmetrical SKIN: No acute rashes normal turgor, color, no bruising or petechiae. PSYCH: Oriented, good eye contact, no obvious depression anxiety, cognition and judgment appear normal. LN: no cervical axillary inguinal adenopathy Diabetic Foot Exam - Simple   Simple Foot Form Diabetic Foot exam was performed with the following findings: Yes 11/27/2018  9:08 AM  Visual Inspection No deformities, no ulcerations, no other skin breakdown bilaterally: Yes Sensation Testing Intact to touch and monofilament testing bilaterally: Yes Pulse Check Posterior Tibialis and Dorsalis pulse intact bilaterally: Yes Comments     Lab Results  Component Value Date   WBC 4.5 11/27/2018   HGB 12.9 11/27/2018   HCT 38.9 11/27/2018   PLT 356.0 11/27/2018   GLUCOSE 80 11/27/2018   CHOL 204 (H) 11/27/2018   TRIG 60.0 11/27/2018   HDL 59.80 11/27/2018   LDLDIRECT 162.5 03/07/2013   LDLCALC 132 (H) 11/27/2018   ALT 15 11/27/2018   AST 18 11/27/2018   NA 143 11/27/2018   K 4.8 11/27/2018   CL 106 11/27/2018   CREATININE 1.06 11/27/2018   BUN 18 11/27/2018   CO2 31 11/27/2018   TSH 1.12 11/27/2018   HGBA1C 5.4 11/27/2018   MICROALBUR 1.0 11/27/2018    BP Readings from Last 3 Encounters:  11/27/18 132/80  04/19/18 122/70  04/09/18 138/82     Lab orders  reviewed with patient    Current Outpatient Medications (Endocrine & Metabolic):    empagliflozin (JARDIANCE) 10 MG TABS tablet, Take 10 mg by mouth daily.  Current Outpatient Medications (Cardiovascular):    atorvastatin (LIPITOR) 20 MG tablet, TAKE 1 TABLET BY MOUTH  DAILY   telmisartan-hydrochlorothiazide (MICARDIS HCT) 80-12.5 MG tablet, Take 1 tablet by mouth daily.  Current Outpatient Medications (Respiratory):    cetirizine (ZYRTEC) 10 MG tablet, TAKE 1 TABLET BY MOUTH DAILY   fluticasone (FLONASE) 50 MCG/ACT nasal spray, 2 spray each nostril qd  Current Outpatient Medications (Analgesics):    acetaminophen (TYLENOL) 325 MG tablet, Take 2 tablets (650 mg total) by mouth every 8 (eight) hours as needed for pain (headache).   aspirin 81 MG EC tablet, Take 1 tablet (81 mg total) by mouth daily. Swallow whole.   naproxen (NAPROSYN) 500 MG tablet, Take 1 tablet (500 mg total) by mouth 2 (two) times daily with a meal.   Current Outpatient Medications (Other):    Biotin 1000 MCG tablet, Take 1,000 mcg by mouth 3 (three) times daily.   Multiple Vitamin (MULTI-VITAMIN PO), Take by mouth.   omeprazole (PRILOSEC) 20 MG capsule, Take 1 capsule (20 mg total) by mouth daily.   OPTIVE SENSITIVE 0.5-0.9 % SOLN, INT 1 GTT IN OU TID   ASSESSMENT AND PLAN:  Discussed the following assessment and plan:    ICD-10-CM  1. Visit for preventive health examination  123456 Basic metabolic panel    CBC with Differential/Platelet    Hemoglobin A1c    Hepatic function panel    Lipid panel    TSH    Microalbumin / creatinine urine ratio    Hepatitis C antibody  2. Medication management  123456 Basic metabolic panel    CBC with Differential/Platelet    Hemoglobin A1c    Hepatic function panel    Lipid panel    TSH    Microalbumin / creatinine urine ratio  3. Type 2 diabetes mellitus with complication, without long-term current use of insulin (HCC)  [id number] Basic  metabolic panel    CBC with Differential/Platelet    Hemoglobin A1c    Hepatic function panel    Lipid panel    TSH    Microalbumin / creatinine urine ratio   controlled without  complications  4. Essential hypertension  99991111 Basic metabolic panel    CBC with Differential/Platelet    Hemoglobin A1c    Hepatic function panel    Lipid panel    TSH    Microalbumin / creatinine urine ratio   controlled   5. Hyperlipidemia, unspecified hyperlipidemia type  99991111 Basic metabolic panel    CBC with Differential/Platelet    Hemoglobin A1c    Hepatic function panel    Lipid panel    TSH    Microalbumin / creatinine urine ratio  6. Encounter for hepatitis C screening test for low risk patient  Z11.59 Hepatitis C antibody  7. Colon cancer screening  Z12.11   colon cancer screening disc  No fam hx and remote hx of colon    10 year fu disc cologuard and can order says covered  Will get flu vaccine a t work.  Lab and refills today   Patient Care Team: Linkoln Alkire, Standley Brooking, MD as PCP - Elmer Sow, PA-C as Physician Assistant (Obstetrics and Gynecology) Debbra Riding, MD as Consulting Physician (Ophthalmology) Patient Instructions  Glad you are doing well  Get  flu vaccine before mid October .  Will notify you  of labs when available. If all ok then  6 mos med a1c check.   Will order cologuard  For colon  Cancer screening.     Health Maintenance, Female Adopting a healthy lifestyle and getting preventive care are important in promoting health and wellness. Ask your health care provider about:  The right schedule for you to have regular tests and exams.  Things you can do on your own to prevent diseases and keep yourself healthy. What should I know about diet, weight, and exercise? Eat a healthy diet   Eat a diet that includes plenty of vegetables, fruits, low-fat dairy products, and lean protein.  Do not eat a lot of foods that are high in solid fats, added sugars,  or sodium. Maintain a healthy weight Body mass index (BMI) is used to identify weight problems. It estimates body fat based on height and weight. Your health care provider can help determine your BMI and help you achieve or maintain a healthy weight. Get regular exercise Get regular exercise. This is one of the most important things you can do for your health. Most adults should:  Exercise for at least 150 minutes each week. The exercise should increase your heart rate and make you sweat (moderate-intensity exercise).  Do strengthening exercises at least twice a week. This is in addition to the moderate-intensity exercise.  Spend less time  sitting. Even light physical activity can be beneficial. Watch cholesterol and blood lipids Have your blood tested for lipids and cholesterol at 55 years of age, then have this test every 5 years. Have your cholesterol levels checked more often if:  Your lipid or cholesterol levels are high.  You are older than 55 years of age.  You are at high risk for heart disease. What should I know about cancer screening? Depending on your health history and family history, you may need to have cancer screening at various ages. This may include screening for:  Breast cancer.  Cervical cancer.  Colorectal cancer.  Skin cancer.  Lung cancer. What should I know about heart disease, diabetes, and high blood pressure? Blood pressure and heart disease  High blood pressure causes heart disease and increases the risk of stroke. This is more likely to develop in people who have high blood pressure readings, are of African descent, or are overweight.  Have your blood pressure checked: ? Every 3-5 years if you are 50-74 years of age. ? Every year if you are 34 years old or older. Diabetes Have regular diabetes screenings. This checks your fasting blood sugar level. Have the screening done:  Once every three years after age 62 if you are at a normal weight and  have a low risk for diabetes.  More often and at a younger age if you are overweight or have a high risk for diabetes. What should I know about preventing infection? Hepatitis B If you have a higher risk for hepatitis B, you should be screened for this virus. Talk with your health care provider to find out if you are at risk for hepatitis B infection. Hepatitis C Testing is recommended for:  Everyone born from [birth year] through 1967.  Anyone with known risk factors for hepatitis C. Sexually transmitted infections (STIs)  Get screened for STIs, including gonorrhea and chlamydia, if: ? You are sexually active and are younger than 55 years of age. ? You are older than 55 years of age and your health care provider tells you that you are at risk for this type of infection. ? Your sexual activity has changed since you were last screened, and you are at increased risk for chlamydia or gonorrhea. Ask your health care provider if you are at risk.  Ask your health care provider about whether you are at high risk for HIV. Your health care provider may recommend a prescription medicine to help prevent HIV infection. If you choose to take medicine to prevent HIV, you should first get tested for HIV. You should then be tested every 3 months for as long as you are taking the medicine. Pregnancy  If you are about to stop having your period (premenopausal) and you may become pregnant, seek counseling before you get pregnant.  Take 400 to 800 micrograms (mcg) of folic acid every day if you become pregnant.  Ask for birth control (contraception) if you want to prevent pregnancy. Osteoporosis and menopause Osteoporosis is a disease in which the bones lose minerals and strength with aging. This can result in bone fractures. If you are 39 years old or older, or if you are at risk for osteoporosis and fractures, ask your health care provider if you should:  Be screened for bone loss.  Take a calcium or vitamin D  supplement to lower your risk of fractures.  Be given hormone replacement therapy (HRT) to treat symptoms of menopause. Follow these instructions at home: Lifestyle  Do not use any products that contain nicotine or tobacco, such as cigarettes, e-cigarettes, and chewing tobacco. If you need help quitting, ask your health care provider.  Do not use street drugs.  Do not share needles.  Ask your health care provider for help if you need support or information about quitting drugs. Alcohol use  Do not drink alcohol if: ? Your health care provider tells you not to drink. ? You are pregnant, may be pregnant, or are planning to become pregnant.  If you drink alcohol: ? Limit how much you use to 0-1 drink a day. ? Limit intake if you are breastfeeding.  Be aware of how much alcohol is in your drink. In the [place] one drink equals one 12 oz bottle of beer (355 mL), one 5 oz glass of wine (148 mL), or one 1 oz glass of hard liquor (44 mL). General instructions  Schedule regular health, dental, and eye exams.  Stay current with your vaccines.  Tell your health care provider if: ? You often feel depressed. ? You have ever been abused or do not feel safe at home. Summary  Adopting a healthy lifestyle and getting preventive care are important in promoting health and wellness.  Follow your health care provider's instructions about healthy diet, exercising, and getting tested or screened for diseases.  Follow your health care provider's instructions on monitoring your cholesterol and blood pressure. This information is not intended to replace advice given to you by your health care provider. Make sure you discuss any questions you have with your health care provider. Document Released: 10/04/2010 Document Revised: 03/14/2018 Document Reviewed: 03/14/2018 Elsevier Patient Education  2020 [employer]is Dascoli M.D.

## 2018-11-27 ENCOUNTER — Other Ambulatory Visit: Payer: Self-pay

## 2018-11-27 ENCOUNTER — Ambulatory Visit (INDEPENDENT_AMBULATORY_CARE_PROVIDER_SITE_OTHER): Payer: 59 | Admitting: Internal Medicine

## 2018-11-27 ENCOUNTER — Encounter: Payer: Self-pay | Admitting: Internal Medicine

## 2018-11-27 VITALS — BP 132/80 | HR 64 | Temp 97.7°F | Ht 66.0 in | Wt 185.0 lb

## 2018-11-27 DIAGNOSIS — E118 Type 2 diabetes mellitus with unspecified complications: Secondary | ICD-10-CM | POA: Diagnosis not present

## 2018-11-27 DIAGNOSIS — E785 Hyperlipidemia, unspecified: Secondary | ICD-10-CM | POA: Diagnosis not present

## 2018-11-27 DIAGNOSIS — Z1211 Encounter for screening for malignant neoplasm of colon: Secondary | ICD-10-CM | POA: Diagnosis not present

## 2018-11-27 DIAGNOSIS — Z1159 Encounter for screening for other viral diseases: Secondary | ICD-10-CM

## 2018-11-27 DIAGNOSIS — Z0000 Encounter for general adult medical examination without abnormal findings: Secondary | ICD-10-CM

## 2018-11-27 DIAGNOSIS — Z79899 Other long term (current) drug therapy: Secondary | ICD-10-CM

## 2018-11-27 DIAGNOSIS — I10 Essential (primary) hypertension: Secondary | ICD-10-CM

## 2018-11-27 LAB — CBC WITH DIFFERENTIAL/PLATELET
Basophils Absolute: 0 10*3/uL (ref 0.0–0.1)
Basophils Relative: 0.8 % (ref 0.0–3.0)
Eosinophils Absolute: 0.1 10*3/uL (ref 0.0–0.7)
Eosinophils Relative: 1.2 % (ref 0.0–5.0)
HCT: 38.9 % (ref 36.0–46.0)
Hemoglobin: 12.9 g/dL (ref 12.0–15.0)
Lymphocytes Relative: 34 % (ref 12.0–46.0)
Lymphs Abs: 1.5 10*3/uL (ref 0.7–4.0)
MCHC: 33.2 g/dL (ref 30.0–36.0)
MCV: 88.8 fl (ref 78.0–100.0)
Monocytes Absolute: 0.3 10*3/uL (ref 0.1–1.0)
Monocytes Relative: 7.7 % (ref 3.0–12.0)
Neutro Abs: 2.5 10*3/uL (ref 1.4–7.7)
Neutrophils Relative %: 56.3 % (ref 43.0–77.0)
Platelets: 356 10*3/uL (ref 150.0–400.0)
RBC: 4.38 Mil/uL (ref 3.87–5.11)
RDW: 13.6 % (ref 11.5–15.5)
WBC: 4.5 10*3/uL (ref 4.0–10.5)

## 2018-11-27 LAB — HEPATIC FUNCTION PANEL
ALT: 15 U/L (ref 0–35)
AST: 18 U/L (ref 0–37)
Albumin: 4.6 g/dL (ref 3.5–5.2)
Alkaline Phosphatase: 68 U/L (ref 39–117)
Bilirubin, Direct: 0.1 mg/dL (ref 0.0–0.3)
Total Bilirubin: 0.5 mg/dL (ref 0.2–1.2)
Total Protein: 7.3 g/dL (ref 6.0–8.3)

## 2018-11-27 LAB — BASIC METABOLIC PANEL
BUN: 18 mg/dL (ref 6–23)
CO2: 31 mEq/L (ref 19–32)
Calcium: 9.9 mg/dL (ref 8.4–10.5)
Chloride: 106 mEq/L (ref 96–112)
Creatinine, Ser: 1.06 mg/dL (ref 0.40–1.20)
GFR: 65.07 mL/min (ref >60.00)
Glucose, Bld: 80 mg/dL (ref 70–99)
Potassium: 4.8 mEq/L (ref 3.5–5.1)
Sodium: 143 mEq/L (ref 135–145)

## 2018-11-27 LAB — HEMOGLOBIN A1C: Hgb A1c MFr Bld: 5.4 % (ref 4.6–6.5)

## 2018-11-27 LAB — MICROALBUMIN / CREATININE URINE RATIO
Creatinine,U: 158.4 mg/dL
Microalb Creat Ratio: 0.6 mg/g (ref 0.0–30.0)
Microalb, Ur: 1 mg/dL (ref 0.0–1.9)

## 2018-11-27 LAB — LIPID PANEL
Cholesterol: 204 mg/dL — ABNORMAL HIGH (ref 0–200)
HDL: 59.8 mg/dL (ref >39.00)
LDL Cholesterol: 132 mg/dL — ABNORMAL HIGH (ref 0–99)
NonHDL: 143.98
Total CHOL/HDL Ratio: 3
Triglycerides: 60 mg/dL (ref 0.0–149.0)
VLDL: 12 mg/dL (ref 0.0–40.0)

## 2018-11-27 LAB — TSH: TSH: 1.12 u[IU]/mL (ref 0.35–4.50)

## 2018-11-27 MED ORDER — CETIRIZINE HCL 10 MG PO TABS: ORAL_TABLET | ORAL | 11 refills | Status: DC

## 2018-11-27 MED ORDER — TELMISARTAN-HCTZ 80-12.5 MG PO TABS: 1.0000 | ORAL_TABLET | Freq: Every day | ORAL | 3 refills | Status: DC

## 2018-11-27 MED ORDER — JARDIANCE 10 MG PO TABS: 10.0000 mg | ORAL_TABLET | Freq: Every day | ORAL | 3 refills | Status: DC

## 2018-11-27 NOTE — Patient Instructions (Addendum)
Glad you are doing well  Get  flu vaccine before mid October .  Will notify you  of labs when available. If all ok then  6 mos med a1c check.   Will order cologuard  For colon  Cancer screening.     Health Maintenance, Female Adopting a healthy lifestyle and getting preventive care are important in promoting health and wellness. Ask your health care provider about:  The right schedule for you to have regular tests and exams.  Things you can do on your own to prevent diseases and keep yourself healthy. What should I know about diet, weight, and exercise? Eat a healthy diet   Eat a diet that includes plenty of vegetables, fruits, low-fat dairy products, and lean protein.  Do not eat a lot of foods that are high in solid fats, added sugars, or sodium. Maintain a healthy weight Body mass index (BMI) is used to identify weight problems. It estimates body fat based on height and weight. Your health care provider can help determine your BMI and help you achieve or maintain a healthy weight. Get regular exercise Get regular exercise. This is one of the most important things you can do for your health. Most adults should:  Exercise for at least 150 minutes each week. The exercise should increase your heart rate and make you sweat (moderate-intensity exercise).  Do strengthening exercises at least twice a week. This is in addition to the moderate-intensity exercise.  Spend less time sitting. Even light physical activity can be beneficial. Watch cholesterol and blood lipids Have your blood tested for lipids and cholesterol at 55 years of age, then have this test every 5 years. Have your cholesterol levels checked more often if:  Your lipid or cholesterol levels are high.  You are older than 55 years of age.  You are at high risk for heart disease. What should I know about cancer screening? Depending on your health history and family history, you may need to have cancer screening at  various ages. This may include screening for:  Breast cancer.  Cervical cancer.  Colorectal cancer.  Skin cancer.  Lung cancer. What should I know about heart disease, diabetes, and high blood pressure? Blood pressure and heart disease  High blood pressure causes heart disease and increases the risk of stroke. This is more likely to develop in people who have high blood pressure readings, are of African descent, or are overweight.  Have your blood pressure checked: ? Every 3-5 years if you are 66-88 years of age. ? Every year if you are 46 years old or older. Diabetes Have regular diabetes screenings. This checks your fasting blood sugar level. Have the screening done:  Once every three years after age 15 if you are at a normal weight and have a low risk for diabetes.  More often and at a younger age if you are overweight or have a high risk for diabetes. What should I know about preventing infection? Hepatitis B If you have a higher risk for hepatitis B, you should be screened for this virus. Talk with your health care provider to find out if you are at risk for hepatitis B infection. Hepatitis C Testing is recommended for:  Everyone born from [birth year] through 1967.  Anyone with known risk factors for hepatitis C. Sexually transmitted infections (STIs)  Get screened for STIs, including gonorrhea and chlamydia, if: ? You are sexually active and are younger than 55 years of age. ? You are older than  55 years of age and your health care provider tells you that you are at risk for this type of infection. ? Your sexual activity has changed since you were last screened, and you are at increased risk for chlamydia or gonorrhea. Ask your health care provider if you are at risk.  Ask your health care provider about whether you are at high risk for HIV. Your health care provider may recommend a prescription medicine to help prevent HIV infection. If you choose to take medicine to prevent  HIV, you should first get tested for HIV. You should then be tested every 3 months for as long as you are taking the medicine. Pregnancy  If you are about to stop having your period (premenopausal) and you may become pregnant, seek counseling before you get pregnant.  Take 400 to 800 micrograms (mcg) of folic acid every day if you become pregnant.  Ask for birth control (contraception) if you want to prevent pregnancy. Osteoporosis and menopause Osteoporosis is a disease in which the bones lose minerals and strength with aging. This can result in bone fractures. If you are 63 years old or older, or if you are at risk for osteoporosis and fractures, ask your health care provider if you should:  Be screened for bone loss.  Take a calcium or vitamin D supplement to lower your risk of fractures.  Be given hormone replacement therapy (HRT) to treat symptoms of menopause. Follow these instructions at home: Lifestyle  Do not use any products that contain nicotine or tobacco, such as cigarettes, e-cigarettes, and chewing tobacco. If you need help quitting, ask your health care provider.  Do not use street drugs.  Do not share needles.  Ask your health care provider for help if you need support or information about quitting drugs. Alcohol use  Do not drink alcohol if: ? Your health care provider tells you not to drink. ? You are pregnant, may be pregnant, or are planning to become pregnant.  If you drink alcohol: ? Limit how much you use to 0-1 drink a day. ? Limit intake if you are breastfeeding.  Be aware of how much alcohol is in your drink. In the [place] one drink equals one 12 oz bottle of beer (355 mL), one 5 oz glass of wine (148 mL), or one 1 oz glass of hard liquor (44 mL). General instructions  Schedule regular health, dental, and eye exams.  Stay current with your vaccines.  Tell your health care provider if: ? You often feel depressed. ? You have ever been abused or do  not feel safe at home. Summary  Adopting a healthy lifestyle and getting preventive care are important in promoting health and wellness.  Follow your health care provider's instructions about healthy diet, exercising, and getting tested or screened for diseases.  Follow your health care provider's instructions on monitoring your cholesterol and blood pressure. This information is not intended to replace advice given to you by your health care provider. Make sure you discuss any questions you have with your health care provider. Document Released: 10/04/2010 Document Revised: 03/14/2018 Document Reviewed: 03/14/2018 Elsevier Patient Education  2020 [employer].

## 2018-11-28 LAB — HEPATITIS C ANTIBODY
Hepatitis C Ab: NONREACTIVE
SIGNAL TO CUT-OFF: 0.2 (ref <1.00)

## 2018-12-11 ENCOUNTER — Ambulatory Visit (HOSPITAL_COMMUNITY)
Admission: EM | Admit: 2018-12-11 | Discharge: 2018-12-11 | Disposition: A | Discharge status: Home / Self Care | Payer: 59 | Attending: Family Medicine | Admitting: Family Medicine

## 2018-12-11 ENCOUNTER — Encounter (HOSPITAL_COMMUNITY): Payer: Self-pay

## 2018-12-11 ENCOUNTER — Other Ambulatory Visit: Payer: Self-pay

## 2018-12-11 DIAGNOSIS — S50869A Insect bite (nonvenomous) of unspecified forearm, initial encounter: Secondary | ICD-10-CM

## 2018-12-11 DIAGNOSIS — W57XXXA Bitten or stung by nonvenomous insect and other nonvenomous arthropods, initial encounter: Secondary | ICD-10-CM

## 2018-12-11 MED ORDER — PREDNISONE 10 MG (21) PO TBPK: ORAL_TABLET | Freq: Every day | ORAL | 0 refills | Status: DC

## 2018-12-11 NOTE — ED Triage Notes (Signed)
Pt states she slept at a motel over the weekend and now she has a rash on her face , arms and legs. Pt thinks it could be bed bug bites.

## 2018-12-12 NOTE — ED Provider Notes (Signed)
[name]   [id number] 12/11/18 Arrival Time: [place]:  1. Insect bite of forearm, unspecified laterality, initial encounter     Bilateral forearms. No signs of infection.  To begin: Meds ordered this encounter  Medications   predniSONE (STERAPRED UNI-PAK 21 TAB) 10 MG (21) TBPK tablet    Sig: Take by mouth daily. Take as directed.    Dispense:  21 tablet    Refill:  0    OTC Benadryl if needed. Will follow up with PCP or here if worsening or failing to improve as anticipated.  Reviewed expectations re: course of current medical issues. Questions answered. Outlined signs and symptoms indicating need for more acute intervention. Patient verbalized understanding. After Visit Summary given.   SUBJECTIVE:  Crystal Townsend is a 53 y.o. female who presents with a skin complaint.   Location: bilateral forearms Onset: abrupt Duration: few days; noticed after sleeping in motel the night before; questions bed bug bites; no bleeding from areas noted Associated pruritis? significant Associated pain? none Progression: stable  Drainage? none Known trigger? No  New soaps/lotions/topicals/detergents/environmental exposures? No Contacts with similar? No Other associated symptoms: none Therapies tried thus far: none Arthralgia or myalgia? none Recent illness? none Fever? none New medications? none No specific aggravating or alleviating factors reported. No associated CP/SOB/swallowing difficulties.  ROS: As per HPI. All other systems negative.   OBJECTIVE: Vitals:   12/11/18 1949 12/11/18 1950  Pulse: 67   Resp: 18   Temp: 98.7 F (37.1 C)   TempSrc: Oral   SpO2: 100%   Weight:  77.1 kg    General appearance: alert; no distress HEENT: ; AT Neck: supple without LAD Lungs: clear to auscultation bilaterally Heart: regular rate and rhythm Extremities: no edema Skin: warm and dry; signs of infection: no; bilateral forearms with  scattered erythematous indurations measuring approx 0.5 - 1 cm each; no fluctuance; no active drainage or bleeding Psychological: alert and cooperative; normal mood and affect  Allergies  Allergen Reactions   Lisinopril     REACTION: throat epiglottal? swelling ? from ace  . Dr Constance Holster evaluation.  10.09   Metformin     REACTION: severe nausea with 500 mg  Immediate release    Past Medical History:  Diagnosis Date   Allergic rhinitis    Allergy    Anxiety    Chest pain    Hx, no problems as of 08/10/15   Depression    Diabetes mellitus    Type II   Dysmenorrhea    Fatigue    Hx, no current problems as of 08/10/15   Fibroids    GERD (gastroesophageal reflux disease)    diet controlled, no meds   Hyperlipidemia    Hypertension    IBS (irritable bowel syndrome)    no meds, diet controlled   Impaired fasting glucose    Knee pain, bilateral    Hx, no current problems as of 08/10/15   Leukoplakia oral mucosa    2011   Menorrhagia    OSA (obstructive sleep apnea)    does not use cpap, never went to get machine   Other dysfunctions of sleep stages or arousal from sleep    Sleep apnea    Does not use CPAP   Snoring    had a sleep study mild osa 2009   Social History   Socioeconomic History   Marital status: Married    Spouse name: Not on file   Number of children:  Not on file   Years of education: Not on file   Highest education level: Not on file  Occupational History   Not on file  Social Needs   Financial resource strain: Not on file   Food insecurity    Worry: Not on file    Inability: Not on file   Transportation needs    Medical: Not on file    Non-medical: Not on file  Tobacco Use   Smoking status: Never Smoker   Smokeless tobacco: Never Used  Substance and Sexual Activity   Alcohol use: Yes    Comment: social wine   Drug use: No   Sexual activity: Not Currently    Birth control/protection: I.U.D.    Comment: mirena -  inserted in 2015  Lifestyle   Physical activity    Days per week: Not on file    Minutes per session: Not on file   Stress: Not on file  Relationships   Social connections    Talks on phone: Not on file    Gets together: Not on file    Attends religious service: Not on file    Active member of club or organization: Not on file    Attends meetings of clubs or organizations: Not on file    Relationship status: Not on file   Intimate partner violence    Fear of current or ex partner: Not on file    Emotionally abused: Not on file    Physically abused: Not on file    Forced sexual activity: Not on file  Other Topics Concern   Not on file  Social History Narrative   Married husband left suddenly   Single   Customer service rep    [place] has graduated and still works   Son 19 doing ok working Scientist, research (medical)   Family History  Problem Relation Age of Onset   Lung cancer Mother    Cancer Mother        lung    Prostate cancer Father    Cancer Father        prostate   Diabetes Father    Hyperlipidemia Father    Hypertension Father    Asthma Father    Cancer Maternal Aunt        breast   Cancer Maternal Aunt        breast   Cancer Maternal Aunt        breast   Colon polyps Neg Hx    Esophageal cancer Neg Hx    Rectal cancer Neg Hx    Stomach cancer Neg Hx    Past Surgical History:  Procedure Laterality Date   BREAST LUMPECTOMY WITH RADIOACTIVE SEED LOCALIZATION Right 03/05/2015   Procedure: RIGHT BREAST LUMPECTOMY WITH RADIOACTIVE SEED LOCALIZATION;  Surgeon: Erroll Luna, MD;  Location: [facility];  Service: General;  Laterality: Right;   [place]   COLONOSCOPY  2007   [facility] - polyp   DILATION AND CURETTAGE OF UTERUS     ECTOPIC PREGNANCY SURGERY  2000   laparoscopy   oral biopsy       Vanessa Kick, MD 12/12/18 1123

## 2018-12-13 ENCOUNTER — Other Ambulatory Visit: Payer: Self-pay | Admitting: Internal Medicine

## 2019-01-21 ENCOUNTER — Other Ambulatory Visit: Payer: Self-pay

## 2019-01-21 ENCOUNTER — Emergency Department (HOSPITAL_COMMUNITY): Payer: 59

## 2019-01-21 ENCOUNTER — Ambulatory Visit: Payer: Self-pay

## 2019-01-21 ENCOUNTER — Emergency Department (HOSPITAL_COMMUNITY)
Admission: EM | Admit: 2019-01-21 | Discharge: 2019-01-21 | Disposition: A | Discharge status: Home / Self Care | Payer: 59 | Attending: Emergency Medicine | Admitting: Emergency Medicine

## 2019-01-21 DIAGNOSIS — D259 Leiomyoma of uterus, unspecified: Secondary | ICD-10-CM | POA: Diagnosis not present

## 2019-01-21 DIAGNOSIS — I10 Essential (primary) hypertension: Secondary | ICD-10-CM | POA: Diagnosis not present

## 2019-01-21 DIAGNOSIS — Z7984 Long term (current) use of oral hypoglycemic drugs: Secondary | ICD-10-CM | POA: Insufficient documentation

## 2019-01-21 DIAGNOSIS — N390 Urinary tract infection, site not specified: Secondary | ICD-10-CM

## 2019-01-21 DIAGNOSIS — E119 Type 2 diabetes mellitus without complications: Secondary | ICD-10-CM | POA: Insufficient documentation

## 2019-01-21 DIAGNOSIS — R310 Gross hematuria: Secondary | ICD-10-CM

## 2019-01-21 DIAGNOSIS — Z7982 Long term (current) use of aspirin: Secondary | ICD-10-CM | POA: Diagnosis not present

## 2019-01-21 DIAGNOSIS — R109 Unspecified abdominal pain: Secondary | ICD-10-CM

## 2019-01-21 DIAGNOSIS — Z79899 Other long term (current) drug therapy: Secondary | ICD-10-CM | POA: Diagnosis not present

## 2019-01-21 DIAGNOSIS — R1032 Left lower quadrant pain: Secondary | ICD-10-CM | POA: Diagnosis present

## 2019-01-21 LAB — CBC WITH DIFFERENTIAL/PLATELET
Abs Immature Granulocytes: 0.02 10*3/uL (ref 0.00–0.07)
Basophils Absolute: 0 10*3/uL (ref 0.0–0.1)
Basophils Relative: 0 %
Eosinophils Absolute: 0.1 10*3/uL (ref 0.0–0.5)
Eosinophils Relative: 1 %
HCT: 45.2 % (ref 36.0–46.0)
Hemoglobin: 14 g/dL (ref 12.0–15.0)
Immature Granulocytes: 0 %
Lymphocytes Relative: 14 %
Lymphs Abs: 1.2 10*3/uL (ref 0.7–4.0)
MCH: 29 pg (ref 26.0–34.0)
MCHC: 31 g/dL (ref 30.0–36.0)
MCV: 93.8 fL (ref 80.0–100.0)
Monocytes Absolute: 0.5 10*3/uL (ref 0.1–1.0)
Monocytes Relative: 6 %
Neutro Abs: 6.6 10*3/uL (ref 1.7–7.7)
Neutrophils Relative %: 79 %
Platelets: 419 10*3/uL — ABNORMAL HIGH (ref 150–400)
RBC: 4.82 MIL/uL (ref 3.87–5.11)
RDW: 13.1 % (ref 11.5–15.5)
WBC: 8.4 10*3/uL (ref 4.0–10.5)
nRBC: 0 % (ref 0.0–0.2)

## 2019-01-21 LAB — URINALYSIS, MICROSCOPIC (REFLEX): RBC / HPF: >50 RBC/hpf (ref 0–5)

## 2019-01-21 LAB — URINALYSIS, ROUTINE W REFLEX MICROSCOPIC
Bilirubin Urine: NEGATIVE
Glucose, UA: >=500 mg/dL — AB
Ketones, ur: NEGATIVE mg/dL
Nitrite: NEGATIVE
Protein, ur: 100 mg/dL — AB
Specific Gravity, Urine: <1.005 — ABNORMAL LOW (ref 1.005–1.030)
pH: 7 (ref 5.0–8.0)

## 2019-01-21 LAB — BASIC METABOLIC PANEL
Anion gap: 9 (ref 5–15)
BUN: 12 mg/dL (ref 6–20)
CO2: 27 mmol/L (ref 22–32)
Calcium: 9.9 mg/dL (ref 8.9–10.3)
Chloride: 103 mmol/L (ref 98–111)
Creatinine, Ser: 1.05 mg/dL — ABNORMAL HIGH (ref 0.44–1.00)
GFR calc Af Amer: >60 mL/min (ref >60)
GFR calc non Af Amer: 60 mL/min — ABNORMAL LOW (ref >60)
Glucose, Bld: 83 mg/dL (ref 70–99)
Potassium: 3.9 mmol/L (ref 3.5–5.1)
Sodium: 139 mmol/L (ref 135–145)

## 2019-01-21 LAB — POC URINE PREG, ED: Preg Test, Ur: NEGATIVE

## 2019-01-21 MED ORDER — CEPHALEXIN 250 MG PO CAPS
500.0000 mg | ORAL_CAPSULE | Freq: Once | ORAL | Status: AC
  Administered 2019-01-21: 500 mg via ORAL
  Filled 2019-01-21: qty 2

## 2019-01-21 MED ORDER — CEPHALEXIN 500 MG PO CAPS: 500.0000 mg | ORAL_CAPSULE | Freq: Three times a day (TID) | ORAL | 0 refills | Status: AC

## 2019-01-21 NOTE — Discharge Instructions (Signed)
As we discussed, your CT scan was reassuring.  There was no immediate cause as to why you are having the blood in your urine.  As we mentioned, there was findings of uterine fibroids which looks like you have had them before.  We will plan to treat you for urinary tract infection.  Take antibiotics as directed.  Make sure you complete your, full dose of antibiotics.  As we discussed, if you continue to bleeding, you need to follow-up with urology for further evaluation of what is causing symptoms.  Return the emergency department for any worsening pain, worsening bleeding, fevers, vomiting or any other worsening or concerning symptoms.

## 2019-01-21 NOTE — Telephone Encounter (Signed)
Please see phone note  

## 2019-01-21 NOTE — ED Notes (Signed)
Returned from CT.

## 2019-01-21 NOTE — ED Notes (Signed)
Transported to CT 

## 2019-01-21 NOTE — ED Triage Notes (Signed)
Pt here for hematuria onset this morning. Denies pain, fevers, or burning/pain with urination.

## 2019-01-21 NOTE — ED Provider Notes (Signed)
Care assumed from [facility], PA-C at shift change with CT renal study pending.   In brief, this patient is a 53 y.o. F with PMH/o DM, who presents for evaluation of hematuria and back pain. Reports having some back last night that since improved. She started developing hematuria today. No history of kidney stones.     Physical Exam  BP 126/69   Pulse 78   Temp 98.7 F (37.1 C) (Oral)   Resp 18   SpO2 98%   Physical Exam  ED Course/Procedures     Procedures  Results for orders placed or performed during the hospital encounter of 01/21/19 (from the past 24 hour(s))  Urinalysis, Routine w reflex microscopic- may I&O cath if menses     Status: Abnormal   Collection Time: 01/21/19 12:25 PM  Result Value Ref Range   Color, Urine RED (A) YELLOW   APPearance CLOUDY (A) CLEAR   Specific Gravity, Urine <1.005 (L) 1.005 - 1.030   pH 7.0 5.0 - 8.0   Glucose, UA >=500 (A) NEGATIVE mg/dL   Hgb urine dipstick LARGE (A) NEGATIVE   Bilirubin Urine NEGATIVE NEGATIVE   Ketones, ur NEGATIVE NEGATIVE mg/dL   Protein, ur 100 (A) NEGATIVE mg/dL   Nitrite NEGATIVE NEGATIVE   Leukocytes,Ua SMALL (A) NEGATIVE  Urinalysis, Microscopic (reflex)     Status: Abnormal   Collection Time: 01/21/19 12:25 PM  Result Value Ref Range   RBC / HPF >50 0 - 5 RBC/hpf   WBC, UA 21-50 0 - 5 WBC/hpf   Bacteria, UA FEW (A) NONE SEEN   Squamous Epithelial / LPF 0-5 0 - 5  POC Urine Pregnancy, ED (not at [facility])     Status: None   Collection Time: 01/21/19 12:38 PM  Result Value Ref Range   Preg Test, Ur NEGATIVE NEGATIVE  CBC with Differential/Platelet     Status: Abnormal   Collection Time: 01/21/19  1:43 PM  Result Value Ref Range   WBC 8.4 4.0 - 10.5 K/uL   RBC 4.82 3.87 - 5.11 MIL/uL   Hemoglobin 14.0 12.0 - 15.0 g/dL   HCT 45.2 36.0 - 46.0 %   MCV 93.8 80.0 - 100.0 fL   MCH 29.0 26.0 - 34.0 pg   MCHC 31.0 30.0 - 36.0 g/dL   RDW 13.1 11.5 - 15.5 %   Platelets 419 (H) 150 - 400 K/uL   nRBC 0.0 0.0 -  0.2 %   Neutrophils Relative % 79 %   Neutro Abs 6.6 1.7 - 7.7 K/uL   Lymphocytes Relative 14 %   Lymphs Abs 1.2 0.7 - 4.0 K/uL   Monocytes Relative 6 %   Monocytes Absolute 0.5 0.1 - 1.0 K/uL   Eosinophils Relative 1 %   Eosinophils Absolute 0.1 0.0 - 0.5 K/uL   Basophils Relative 0 %   Basophils Absolute 0.0 0.0 - 0.1 K/uL   Immature Granulocytes 0 %   Abs Immature Granulocytes 0.02 0.00 - 0.07 K/uL  Basic metabolic panel     Status: Abnormal   Collection Time: 01/21/19  1:43 PM  Result Value Ref Range   Sodium 139 135 - 145 mmol/L   Potassium 3.9 3.5 - 5.1 mmol/L   Chloride 103 98 - 111 mmol/L   CO2 27 22 - 32 mmol/L   Glucose, Bld 83 70 - 99 mg/dL   BUN 12 6 - 20 mg/dL   Creatinine, Ser 1.05 (H) 0.44 - 1.00 mg/dL   Calcium 9.9 8.9 - 10.3  mg/dL   GFR calc non Af Amer 60 (L) >60 mL/min   GFR calc Af Amer >60 >60 mL/min   Anion gap 9 5 - 15     MDM   PLAN: She is pending CT renal study. If negative for kidney stone, plan to treat urine and f/u with Urology.   MDM:  BMP is unremarkable.  CBC without any significant leukocytosis or anemia.  Urine pregnancy negative.  UA shows hemoglobin, leukocytes, small amount of pyuria.  We will plan to treat given hematuria.  CT renal study shows no evidence of kidney stone.  No evidence of any acute abnormality noted in the kidneys or bladder.  She does have mention of uterine fibroids.  I reviewed her previous CT scans which had noted fibroid since then.  I discussed results with patient.  Patient reports improvement in pain.  She is hemodynamically stable and requesting to go home.  Given hematuria, will plan to treat her urine.  Urine culture sent.  Patient with drug allergies to lisinopril and Metformin only.  I discussed extensively with patient and she is unsure if it was coming from her vagina or from the urethra but is pretty sure that it was from the urethra.  She stated she had not had any bleeding in between going to the  bathroom.  I did discuss with her that sometimes uterine fibroids can cause bleeding.  Instructed her to follow-up with urology and OB/GYN if symptoms do not improve. At this time, patient exhibits no emergent life-threatening condition that require further evaluation in ED or admission. Patient had ample opportunity for questions and discussion. All patient's questions were answered with full understanding. Strict return precautions discussed. Patient expresses understanding and agreement to plan.    1. Gross hematuria   2. Flank pain   3. Hypertension, unspecified type   4. Lower urinary tract infectious disease   5. Uterine leiomyoma, unspecified location    Portions of this note were generated with Dragon dictation software. Dictation errors may occur despite best attempts at proofreading.   Volanda Napoleon, PA-C 01/21/19 1936    Lucrezia Starch, MD 01/21/19 [phone]

## 2019-01-21 NOTE — ED Provider Notes (Signed)
[facility] EMERGENCY DEPARTMENT Provider Note   CSN: [id number] Arrival date & time: 01/21/19  1216     History   Chief Complaint Chief Complaint  Patient presents with  . Hematuria    HPI Crystal Townsend is a 53 y.o. female.     Patient with history of diabetes, no anticoagulation presents to the emergency department today with complaint of hematuria.  Patient first noted blood in her urine this morning.  The blood filled the toilet bowl.  Patient reports having left-sided back and flank pain last night that was severe at times.  No associated nausea or vomiting.  No abdominal pain.  She denies any no pain with urine but describes tingling.  No vaginal sx. no increased frequency urgency.  Patient denies any fevers, chest pain, cough, shortness of breath.  Took ibuprofen for back pain.      Past Medical History:  Diagnosis Date  . Allergic rhinitis   . Allergy   . Anxiety   . Chest pain    Hx, no problems as of 08/10/15  . Depression   . Diabetes mellitus    Type II  . Dysmenorrhea   . Fatigue    Hx, no current problems as of 08/10/15  . Fibroids   . GERD (gastroesophageal reflux disease)    diet controlled, no meds  . Hyperlipidemia   . Hypertension   . IBS (irritable bowel syndrome)    no meds, diet controlled  . Impaired fasting glucose   . Knee pain, bilateral    Hx, no current problems as of 08/10/15  . Leukoplakia oral mucosa    2011  . Menorrhagia   . OSA (obstructive sleep apnea)    does not use cpap, never went to get machine  . Other dysfunctions of sleep stages or arousal from sleep   . Sleep apnea    Does not use CPAP  . Snoring    had a sleep study mild osa 2009    Patient Active Problem List   Diagnosis Date Noted  . Elevated platelet count 06/03/2015  . Essential hypertension 08/19/2014  . Diabetes mellitus without complication ([place]) [id number]  . Hypopotassemia 07/18/2013  . Allergic rhinitis 11/20/2012  . Sleep  difficulties 11/20/2012  . Diabetes mellitus, type 2 ([place]) 09/26/2011  . Adjustment reaction with brief depressive reaction 06/06/2011  . Adjustment reaction with anxiety and depression 06/06/2011  . Multiple joint pain 06/06/2011  . Hyperlipidemia 02/21/2011  . Prediabetes 09/26/2010  . Neck pain 05/24/2010  . OBESITY 12/28/2009  . NECK PAIN 12/28/2009  . LEUKOPLAKIA OF ORAL MUCOSA INCLUDING TONGUE 08/17/2009  . NUMBNESS 04/07/2009  . HEADACHE 05/12/2008  . OBSTRUCTIVE SLEEP APNEA 05/16/2007  . OTH DYSFUNCTIONS SLEEP STAGES/AROUSAL FROM SLEEP 05/07/2007  . FATIGUE 05/07/2007  . SNORING 05/07/2007  . CHEST PAIN 05/07/2007  . HYPERTENSION 10/25/2006  . KNEE PAIN, BILATERAL 10/25/2006  . HYPERGLYCEMIA 10/25/2006  . ANXIETY 10/19/2006  . ALLERGIC RHINITIS 10/19/2006  . GERD 10/19/2006    Past Surgical History:  Procedure Laterality Date  . BREAST LUMPECTOMY WITH RADIOACTIVE SEED LOCALIZATION Right 03/05/2015   Procedure: RIGHT BREAST LUMPECTOMY WITH RADIOACTIVE SEED LOCALIZATION;  Surgeon: Erroll Luna, MD;  Location: [facility];  Service: General;  Laterality: Right;  . CESAREAN SECTION  1997  . COLONOSCOPY  2007   [facility] - polyp  . DILATION AND CURETTAGE OF UTERUS    . ECTOPIC PREGNANCY SURGERY  2000   laparoscopy  . oral biopsy  OB History    Gravida  1   Para  1   Term      Preterm      AB      Living  1     SAB      TAB      Ectopic      Multiple      Live Births               Home Medications    Prior to Admission medications   Medication Sig Start Date End Date Taking? Authorizing Provider  aspirin 81 MG EC tablet Take 1 tablet (81 mg total) by mouth daily. Swallow whole. 04/19/18  Yes Panosh, Standley Brooking, MD  atorvastatin (LIPITOR) 20 MG tablet TAKE 1 TABLET BY MOUTH  DAILY Patient taking differently: Take 20 mg by mouth every evening.  12/14/18  Yes Panosh, Standley Brooking, MD  Biotin 1000 MCG tablet Take 2,000 mcg by mouth  daily.    Yes [provider]  cetirizine (ZYRTEC) 10 MG tablet TAKE 1 TABLET BY MOUTH DAILY Patient taking differently: Take 10 mg by mouth as needed for allergies.  11/27/18  Yes Panosh, Standley Brooking, MD  empagliflozin (JARDIANCE) 10 MG TABS tablet Take 10 mg by mouth daily. 11/27/18  Yes Panosh, Standley Brooking, MD  fluticasone (FLONASE) 50 MCG/ACT nasal spray 2 spray each nostril qd Patient taking differently: Place 2 sprays into both nostrils daily.  06/03/15  Yes Panosh, Standley Brooking, MD  ibuprofen (ADVIL) 800 MG tablet Take 800 mg by mouth every 8 (eight) hours as needed for moderate pain.   Yes [provider]  Multiple Vitamin (MULTI-VITAMIN PO) Take by mouth.   Yes [provider]  OPTIVE SENSITIVE 0.5-0.9 % SOLN Place 1 drop into both eyes 3 (three) times daily.  04/07/17  Yes [provider]  telmisartan-hydrochlorothiazide (MICARDIS HCT) 80-12.5 MG tablet Take 1 tablet by mouth daily. 11/27/18  Yes Panosh, Standley Brooking, MD  VITAMIN D PO Take 1 capsule by mouth daily.   Yes [provider]  acetaminophen (TYLENOL) 325 MG tablet Take 2 tablets (650 mg total) by mouth every 8 (eight) hours as needed for pain (headache). Patient not taking: Reported on 01/21/2019 04/23/12   Panosh, Standley Brooking, MD  omeprazole (PRILOSEC) 20 MG capsule Take 1 capsule (20 mg total) by mouth daily. Patient not taking: Reported on 01/21/2019 07/28/17   Panosh, Standley Brooking, MD    Family History Family History  Problem Relation Age of Onset  . Lung cancer Mother   . Cancer Mother        lung   . Prostate cancer Father   . Cancer Father        prostate  . Diabetes Father   . Hyperlipidemia Father   . Hypertension Father   . Asthma Father   . Cancer Maternal Aunt        breast  . Cancer Maternal Aunt        breast  . Cancer Maternal Aunt        breast  . Colon polyps Neg Hx   . Esophageal cancer Neg Hx   . Rectal cancer Neg Hx   . Stomach cancer Neg Hx     Social History Social History    Tobacco Use  . Smoking status: Never Smoker  . Smokeless tobacco: Never Used  Substance Use Topics  . Alcohol use: Yes    Comment: social wine  . Drug use: No  Allergies   Lisinopril and Metformin   Review of Systems Review of Systems  Constitutional: Negative for fever.  HENT: Negative for rhinorrhea and sore throat.   Eyes: Negative for redness.  Respiratory: Negative for cough.   Cardiovascular: Negative for chest pain.  Gastrointestinal: Negative for abdominal pain, diarrhea, nausea and vomiting.  Genitourinary: Positive for dysuria and flank pain.  Musculoskeletal: Positive for back pain. Negative for myalgias.  Skin: Negative for rash.  Neurological: Negative for headaches.     Physical Exam Updated Vital Signs BP (!) 190/86 (BP Location: Right Arm)   Pulse 94   Temp 98.7 F (37.1 C) (Oral)   Resp 20   SpO2 99%   Physical Exam Vitals signs and nursing note reviewed.  Constitutional:      Appearance: She is well-developed.  HENT:     Head: Normocephalic and atraumatic.  Eyes:     General:        Right eye: No discharge.        Left eye: No discharge.     Conjunctiva/sclera: Conjunctivae normal.  Neck:     Musculoskeletal: Normal range of motion and neck supple.  Cardiovascular:     Rate and Rhythm: Normal rate and regular rhythm.     Heart sounds: Normal heart sounds.  Pulmonary:     Effort: Pulmonary effort is normal.     Breath sounds: Normal breath sounds.  Abdominal:     Palpations: Abdomen is soft.     Tenderness: There is no abdominal tenderness. There is no right CVA tenderness, left CVA tenderness, guarding or rebound.  Skin:    General: Skin is warm and dry.  Neurological:     Mental Status: She is alert.      ED Treatments / Results  Labs (all labs ordered are listed, but only abnormal results are displayed) Labs Reviewed  URINALYSIS, ROUTINE W REFLEX MICROSCOPIC - Abnormal; Notable for the following components:       Result Value   Color, Urine RED (*)    APPearance CLOUDY (*)    Specific Gravity, Urine <1.005 (*)    Glucose, UA >=500 (*)    Hgb urine dipstick LARGE (*)    Protein, ur 100 (*)    Leukocytes,Ua SMALL (*)    All other components within normal limits  URINALYSIS, MICROSCOPIC (REFLEX) - Abnormal; Notable for the following components:   Bacteria, UA FEW (*)    All other components within normal limits  URINE CULTURE  CBC WITH DIFFERENTIAL/PLATELET  BASIC METABOLIC PANEL  POC URINE PREG, ED    EKG None  Radiology No results found.  Procedures Procedures (including critical care time)  Medications Ordered in ED Medications - No data to display   Initial Impression / Assessment and Plan / ED Course  I have reviewed the triage vital signs and the nursing notes.  Pertinent labs & imaging results that were available during my care of the patient were reviewed by me and considered in my medical decision making (see chart for details).        Patient seen and examined.  Urine with blood, 25-50 white blood cells.  Overall patient symptoms are not suggestive of pyelonephritis.  CT ordered to evaluate for sequelae of recent ureteral stone.  May need urology follow-up.  Will check CBC and BMP.  Vital signs reviewed and are as follows: BP (!) 190/86 (BP Location: Right Arm)   Pulse 94   Temp 98.7 F (37.1 C) (Oral)  Resp 20   SpO2 99%   Awaiting CT imaging and labs.   Signout to [employer] at shift change.   Plan: CT, abx for possible UTI if neg. Urology referral.    Final Clinical Impressions(s) / ED Diagnoses   Final diagnoses:  Gross hematuria  Flank pain  Hypertension, unspecified type   Pending completion of work-up.   ED Discharge Orders    None       Carlisle Cater, PA-C 01/21/19 1619    Lajean Saver, MD 01/27/19 [phone]

## 2019-01-21 NOTE — Telephone Encounter (Signed)
fyi pt going to ED was going to offer a visit for this but saw at bottom where pt was going to ED

## 2019-01-21 NOTE — Telephone Encounter (Addendum)
Patient called stating that she has blood when she urinates.  She states it is pure blood and she is unable to tell if there is any urine. It is dark red and she saw a few clots the first urination of the morning.  She is not dizzy or lightheaded.  She states she has no pain with urination but it is a different feeling that something is there.  She states last at work she had back pain and had to come home. She took motrin and went to bed/  Patient will go to ER per protocol. Care advice read to patient she verbalized understanding. Note will be route to Dr Regis Bill.  Reason for Disposition . Passing pure blood or large blood clots (i.e., size > a dime) (Exception: fleck or small strands)  Answer Assessment - Initial Assessment Questions 1. COLOR of URINE: "Describe the color of the urine."  (e.g., tea-colored, pink, red, blood clots, bloody)     Dark red with clots 2. ONSET: "When did the bleeding start?"      This aM 3. EPISODES: "How many times has there been blood in the urine?" or "How many times today?"     Two times 4. PAIN with URINATION: "Is there any pain with passing your urine?" If so, ask: "How bad is the pain?"  (Scale 1-10; or mild, moderate, severe)    - MILD - complains slightly about urination hurting    - MODERATE - interferes with normal activities      - SEVERE - excruciating, unwilling or unable to urinate because of the pain     uncomfortable 5. FEVER: "Do you have a fever?" If so, ask: "What is your temperature, how was it measured, and when did it start?"     no 6. ASSOCIATED SYMPTOMS: "Are you passing urine more frequently than usual?"   no 7. OTHER SYMPTOMS: "Do you have any other symptoms?" (e.g., back/flank pain, abdominal pain, vomiting)    Lower back pain last night and took motrin 8. PREGNANCY: "Is there any chance you are pregnant?" "When was your last menstrual period?"     N/A  Protocols used: URINE - BLOOD IN-A-AH

## 2019-01-22 LAB — URINE CULTURE: Culture: <10000 — AB

## 2019-07-02 ENCOUNTER — Other Ambulatory Visit: Payer: Self-pay

## 2019-07-02 NOTE — Progress Notes (Signed)
This visit occurred during the SARS-CoV-2 public health emergency.  Safety protocols were in place, including screening questions prior to the visit, additional usage of staff PPE, and extensive cleaning of exam room while observing appropriate contact time as indicated for disinfecting solutions.    Chief Complaint  Patient presents with  . Diabetes    fallingnalso   . Hypertension  . Medication Management    HPI: Crystal Townsend 54 y.o. come in for Chronic disease management  DM not chcling but feels wll no low bg noted   Has  gained some weight back in past month  And had changing lifestyle.Stress.   relalationship changes and loss  In past weeks  Off . And knows how to get back on track   Falling a couple of times  4   Hard to catch balance .   No cognitive  Issues  Hearing visition. 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  Then at home at [employer] changing  Small step  Fell back.  No vertigo .  No nausea and headache. Dizziness  Neuropathy sx  Has used  naprioxyn flexeril at night and some etoh at night to help with the shoulder pain  But not in day  And no dizziness.  Shoulder issues if WC? Right  ROS: See pertinent positives and negatives per HPI.  Past Medical History:  Diagnosis Date  . Allergic rhinitis   . Allergy   . Anxiety   . Chest pain    Hx, no problems as of 08/10/15  . Depression   . Diabetes mellitus    Type II  . Dysmenorrhea   . Fatigue    Hx, no current problems as of 08/10/15  . Fibroids   . GERD (gastroesophageal reflux disease)    diet controlled, no meds  . Hyperlipidemia   . Hypertension   . IBS (irritable bowel syndrome)    no meds, diet controlled  . Impaired fasting glucose   . Knee pain, bilateral    Hx, no current problems as of 08/10/15  . Leukoplakia oral mucosa    2011  . Menorrhagia   . OSA (obstructive sleep apnea)      does not use cpap, never went to get machine  . Other dysfunctions of sleep stages or arousal from sleep   . Sleep apnea    Does not use CPAP  . Snoring    had a sleep study mild [place] 2009    Family History  Problem Relation Age of Onset  . Lung cancer Mother   . Cancer Mother        lung   . Prostate cancer Father   . Cancer Father        prostate  . Diabetes Father   . Hyperlipidemia Father   . Hypertension Father   . Asthma Father   . Cancer Maternal Aunt        breast  . Cancer Maternal Aunt        breast  . Cancer Maternal Aunt        breast  . Colon polyps Neg Hx   . Esophageal cancer Neg Hx   . Rectal cancer Neg Hx   . Stomach cancer Neg Hx     Social History   Socioeconomic History  . Marital status: Married    Spouse name: Not on  file  . Number of children: Not on file  . Years of education: Not on file  . Highest education level: Not on file  Occupational History  . Not on file  Tobacco Use  . Smoking status: Never Smoker  . Smokeless tobacco: Never Used  Substance and Sexual Activity  . Alcohol use: Yes    Comment: social wine  . Drug use: No  . Sexual activity: Not Currently    Birth control/protection: I.U.D.    Comment: mirena - inserted in 2015  Other Topics Concern  . 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 Difficulty of Paying Living Expenses:   Food Insecurity:   . Worried About Charity fundraiser in the Last Year:   . Arboriculturist in the Last Year:   Transportation Needs:   . Film/video editor (Medical):   Marland Kitchen Lack of Transportation (Non-Medical):   Physical Activity:   . Days of Exercise per Week:   . Minutes of Exercise per Session:   Stress:   . Feeling of Stress :   Social Connections:   . Frequency of  Communication with Friends and Family:   . Frequency of Social Gatherings with Friends and Family:   . Attends Religious Services:   . Active Member of Clubs or Organizations:   . Attends Archivist Meetings:   Marland Kitchen Marital Status:     Outpatient Medications Prior to Visit  Medication Sig Dispense Refill  . acetaminophen (TYLENOL) 325 MG tablet Take 2 tablets (650 mg total) by mouth every 8 (eight) hours as needed for pain (headache). 100 tablet 2  . atorvastatin (LIPITOR) 20 MG tablet TAKE 1 TABLET BY MOUTH  DAILY (Patient taking differently: Take 20 mg by mouth every evening. ) 90 tablet 3  . Biotin 1000 MCG tablet Take 2,000 mcg by mouth daily.     . cetirizine (ZYRTEC) 10 MG tablet TAKE 1 TABLET BY MOUTH DAILY (Patient taking differently: Take 10 mg by mouth as needed for allergies. ) 30 tablet 11  . empagliflozin (JARDIANCE) 10 MG TABS tablet Take 10 mg by mouth daily. 90 tablet 3  . ibuprofen (ADVIL) 800 MG tablet Take 800 mg by mouth every 8 (eight) hours as needed for moderate pain.    . Multiple Vitamin (MULTI-VITAMIN PO) Take by mouth.    Meribeth Mattes SENSITIVE 0.5-0.9 % SOLN Place 1 drop into both eyes 3 (three) times daily.   11  . VITAMIN D PO Take 1 capsule by mouth daily.    Marland Kitchen aspirin 81 MG EC tablet Take 1 tablet (81 mg total) by mouth daily. Swallow whole. 100 tablet 0  . telmisartan-hydrochlorothiazide (MICARDIS HCT) 80-12.5 MG tablet Take 1 tablet by mouth daily. 90 tablet 3  . cyclobenzaprine (FLEXERIL) 10 MG tablet cyclobenzaprine 10 mg tablet  TAKE 1 TABLET BY MOUTH THREE TIMES DAILY    . fluticasone (FLONASE) 50 MCG/ACT nasal spray 2 spray each nostril qd (Patient not taking: Reported on 07/03/2019) 16 g 11  . naproxen (NAPROSYN) 500 MG tablet naproxen 500 mg tablet  TAKE 1 TABLET BY MOUTH TWICE DAILY    . omeprazole (PRILOSEC) 20 MG capsule Take 1 capsule (20 mg total) by mouth  daily. (Patient not taking: Reported on 07/03/2019) 30 capsule 1   No  facility-administered medications prior to visit.     EXAM:  BP 140/90   Pulse 72   Temp 97.8 F (36.6 C) (Other (Comment))   Ht 5\' 6"  (1.676 m)   Wt 193 lb (87.5 kg)   SpO2 96%   BMI 31.15 kg/m   Body mass index is 31.15 kg/m.  GENERAL: vitals reviewed and listed above, alert, oriented, appears well hydrated and in no acute distress HEENT: atraumatic, conjunctiva  clear, no obvious abnormalities on inspection of external nose and ears OP masked  NECK: no obvious masses on inspection palpation  LUNGS: clear to auscultation bilaterally, no wheezes, rales or rhonchi, good air movement CV: HRRR, no clubbing cyanosis or  peripheral edema nl cap refill  MS: moves all extremities without noticeable focal  Abnormality feet nl pulses has on tights but nl  And sas sensation ok  NEURO: oriented x 3 CN 3-12 appear intact. No focal muscle weakness or atrophy. DTRs symmetrical. Gait WNL neg Romberg  Shoulder right limited rom  Nl toe  Hell back and forward .  Grossly non focal. No tremor or abnormal movement.  PSYCH: pleasant and cooperative, no obvious depression or anxiety Lab Results  Component Value Date   WBC 8.4 01/21/2019   HGB 14.0 01/21/2019   HCT 45.2 01/21/2019   PLT 419 (H) 01/21/2019   GLUCOSE 83 01/21/2019   CHOL 204 (H) 11/27/2018   TRIG 60.0 11/27/2018   HDL 59.80 11/27/2018   LDLDIRECT 162.5 03/07/2013   LDLCALC 132 (H) 11/27/2018   ALT 15 11/27/2018   AST 18 11/27/2018   NA 139 01/21/2019   K 3.9 01/21/2019   CL 103 01/21/2019   CREATININE 1.05 (H) 01/21/2019   BUN 12 01/21/2019   CO2 27 01/21/2019   TSH 1.12 11/27/2018   HGBA1C 5.2 07/03/2019   MICROALBUR 1.0 11/27/2018   BP Readings from Last 3 Encounters:  07/03/19 140/90  01/21/19 126/69  11/27/18 132/80   Wt Readings from Last 3 Encounters:  07/03/19 193 lb (87.5 kg)  12/11/18 170 lb (77.1 kg)  11/27/18 185 lb (83.9 kg)    ASSESSMENT AND PLAN:  Discussed the following assessment and  plan:  Type 2 diabetes mellitus with complication, without long-term current use of insulin (HCC) - Plan: POC HgB A1c  Medication management  Essential hypertension  History of falling - losing balance see text  no major injury  Class 1 obesity with serious comorbidity and body mass index (BMI) of 31.0 to 31.9 in adult, unspecified obesity type Dm in control  recent weight gain  To get back down  Needs refills  Disc falling balance episodes  Consider residual of medication abeit takes at night  Consider b12 further eval but exam is good today and no evidence of neuropathy    pateint wants to  Observe and precautions  And reassess at cpx next visit  Unless worse in interim  -Patient advised to return or notify health care team  if  new concerns arise.  Patient Instructions  Sugar is  Better  But need to get weight down .   Balance attention and   Monitor consider  b12 level more evaluation . At fu . If needed Caution with flexeril. naproxyn alcohol.   Plan   cpx and lab in July  2021             Mariann Laster K. Earnest Mcgillis M.D.

## 2019-07-03 ENCOUNTER — Ambulatory Visit (INDEPENDENT_AMBULATORY_CARE_PROVIDER_SITE_OTHER): Payer: 59 | Admitting: Internal Medicine

## 2019-07-03 ENCOUNTER — Encounter: Payer: Self-pay | Admitting: Internal Medicine

## 2019-07-03 VITALS — BP 140/90 | HR 72 | Temp 97.8°F | Ht 66.0 in | Wt 193.0 lb

## 2019-07-03 DIAGNOSIS — Z79899 Other long term (current) drug therapy: Secondary | ICD-10-CM

## 2019-07-03 DIAGNOSIS — Z6831 Body mass index (BMI) 31.0-31.9, adult: Secondary | ICD-10-CM

## 2019-07-03 DIAGNOSIS — E669 Obesity, unspecified: Secondary | ICD-10-CM

## 2019-07-03 DIAGNOSIS — E118 Type 2 diabetes mellitus with unspecified complications: Secondary | ICD-10-CM | POA: Diagnosis not present

## 2019-07-03 DIAGNOSIS — Z9181 History of falling: Secondary | ICD-10-CM

## 2019-07-03 DIAGNOSIS — I10 Essential (primary) hypertension: Secondary | ICD-10-CM | POA: Diagnosis not present

## 2019-07-03 LAB — POCT GLYCOSYLATED HEMOGLOBIN (HGB A1C): Hemoglobin A1C: 5.2 % (ref 4.0–5.6)

## 2019-07-03 MED ORDER — TELMISARTAN-HCTZ 80-12.5 MG PO TABS: 1.0000 | ORAL_TABLET | Freq: Every day | ORAL | 1 refills | Status: DC

## 2019-07-03 MED ORDER — ASPIRIN 81 MG PO TBEC: 81.0000 mg | DELAYED_RELEASE_TABLET | Freq: Every day | ORAL | 2 refills | Status: DC

## 2019-07-03 NOTE — Patient Instructions (Addendum)
Sugar is  Better  But need to get weight down .   Balance attention and   Monitor consider  b12 level more evaluation . At fu . If needed Caution with flexeril. naproxyn alcohol.   Plan   cpx and lab in July  2021

## 2019-08-13 ENCOUNTER — Telehealth: Payer: Self-pay | Admitting: Internal Medicine

## 2019-08-13 NOTE — Telephone Encounter (Signed)
Pt is calling in stating that she would like to follow-up on the order to have the Cologuard done she would like to have it done as soon as possible.  Pt would like to have a call back today if possible.

## 2019-08-13 NOTE — Telephone Encounter (Signed)
review of record she had  a colon in 2007 and had  27mm low risk polyp ( hyperplastic) othwise normal .  Ok to order the cologuard   Screen

## 2019-08-13 NOTE — Telephone Encounter (Signed)
Last OV 07/03/2019  OK to order Cologuard? Or do you need a follow up with patient?

## 2019-08-14 NOTE — Telephone Encounter (Signed)
Called patient and confirmed her insurance and new address and gave her the message from Dr. Regis Bill. Faxed cologuard request to [phone] and received confirmation fax was sent.

## 2019-10-14 ENCOUNTER — Other Ambulatory Visit: Payer: Self-pay | Admitting: Internal Medicine

## 2019-11-02 ENCOUNTER — Other Ambulatory Visit: Payer: Self-pay | Admitting: Internal Medicine

## 2019-11-12 ENCOUNTER — Other Ambulatory Visit: Payer: Self-pay | Admitting: Internal Medicine

## 2019-12-02 ENCOUNTER — Encounter: Payer: 59 | Admitting: Internal Medicine

## 2020-01-07 ENCOUNTER — Other Ambulatory Visit: Payer: Self-pay | Admitting: Internal Medicine

## 2020-01-09 ENCOUNTER — Other Ambulatory Visit: Payer: Self-pay | Admitting: Internal Medicine

## 2020-01-14 ENCOUNTER — Encounter: Payer: 59 | Admitting: Internal Medicine

## 2020-01-22 ENCOUNTER — Other Ambulatory Visit: Payer: Self-pay | Admitting: Internal Medicine

## 2020-02-05 ENCOUNTER — Other Ambulatory Visit: Payer: Self-pay | Admitting: Internal Medicine

## 2020-02-17 ENCOUNTER — Other Ambulatory Visit: Payer: Self-pay

## 2020-02-17 ENCOUNTER — Ambulatory Visit (INDEPENDENT_AMBULATORY_CARE_PROVIDER_SITE_OTHER): Payer: 59 | Admitting: Internal Medicine

## 2020-02-17 ENCOUNTER — Encounter: Payer: Self-pay | Admitting: Internal Medicine

## 2020-02-17 VITALS — BP 118/66 | HR 64 | Temp 98.9°F | Ht 66.0 in | Wt 193.0 lb

## 2020-02-17 DIAGNOSIS — Z0000 Encounter for general adult medical examination without abnormal findings: Secondary | ICD-10-CM

## 2020-02-17 DIAGNOSIS — Z79899 Other long term (current) drug therapy: Secondary | ICD-10-CM | POA: Diagnosis not present

## 2020-02-17 DIAGNOSIS — I10 Essential (primary) hypertension: Secondary | ICD-10-CM | POA: Diagnosis not present

## 2020-02-17 DIAGNOSIS — E1169 Type 2 diabetes mellitus with other specified complication: Secondary | ICD-10-CM | POA: Diagnosis not present

## 2020-02-17 DIAGNOSIS — R7989 Other specified abnormal findings of blood chemistry: Secondary | ICD-10-CM | POA: Diagnosis not present

## 2020-02-17 DIAGNOSIS — E785 Hyperlipidemia, unspecified: Secondary | ICD-10-CM

## 2020-02-17 NOTE — Progress Notes (Signed)
Chief Complaint  Patient presents with  . Annual Exam  . Medication Management  . Hypertension  . Diabetes    HPI: Patient  Crystal Townsend  54 y.o. comes in today for Preventive Health Care visit and CDM  bp  Good range taking meds daily doing well  bg not checking   But watching diet.  Has gained a few pounds over the winter and related to surgery and not able to go to the gym.  Upper extremity impingement status post RCM right pt  pc surgery . Dr Victorino December .  Still doing rehab.  No longer taking Prilosec doing well.  Health Maintenance  Topic Date Due  . HIV Screening  Never done  . COLONOSCOPY  08/06/2015  . OPHTHALMOLOGY EXAM  04/14/2019  . HEMOGLOBIN A1C  01/02/2020  . FOOT EXAM  02/16/2021  . MAMMOGRAM  06/20/2021  . PAP SMEAR-Modifier  07/15/2022  . TETANUS/TDAP  10/19/2025  . INFLUENZA VACCINE  Completed  . PNEUMOCOCCAL POLYSACCHARIDE VACCINE AGE 24-64 HIGH RISK  Completed  . COVID-19 Vaccine  Completed  . Hepatitis C Screening  Completed   Health Maintenance Review LIFESTYLE:  Exercise:  Less  but in rehab  For shoulder   To get back to gym but exercise tolerance is good.  No claudication. Tobacco/ETS: no Alcohol:  no Sugar beverages: no Sleep:about 8 - hours  Drug use: no HH of   1  No pets  Work: work 40 hours    44  Hours     Not at home.  Is up-to-date on mammogram has a Cologuard just has not sent it in yet. ROS:  Legs hurta at night this week  And used heating pad.  GEN/ HEENT: No fever, significant weight changes sweats headaches vision problems hearing changes, CV/ PULM; No chest pain shortness of breath cough, syncope,edema  change in exercise tolerance. GI /GU: No adominal pain, vomiting, change in bowel habits. No blood in the stool. No significant GU symptoms. SKIN/HEME: ,no acute skin rashes suspicious lesions or bleeding. No lymphadenopathy, nodules, masses.  NEURO/ PSYCH:  No neurologic signs such as weakness numbness. No  depression anxiety. IMM/ Allergy: No unusual infections.  Allergy .   REST of 12 system review negative except as per HPI   Past Medical History:  Diagnosis Date  . Allergic rhinitis   . Allergy   . Anxiety   . Chest pain    Hx, no problems as of 08/10/15  . Depression   . Diabetes mellitus    Type II  . Dysmenorrhea   . Fatigue    Hx, no current problems as of 08/10/15  . Fibroids   . GERD (gastroesophageal reflux disease)    diet controlled, no meds  . Hyperlipidemia   . Hypertension   . IBS (irritable bowel syndrome)    no meds, diet controlled  . Impaired fasting glucose   . Knee pain, bilateral    Hx, no current problems as of 08/10/15  . Leukoplakia oral mucosa    2011  . Menorrhagia   . OSA (obstructive sleep apnea)    does not use cpap, never went to get machine  . Other dysfunctions of sleep stages or arousal from sleep   . Sleep apnea    Does not use CPAP  . Snoring    had a sleep study mild [facility] 2009    Past Surgical History:  Procedure Laterality Date  . BREAST LUMPECTOMY WITH RADIOACTIVE SEED  LOCALIZATION Right 03/05/2015   Procedure: RIGHT BREAST LUMPECTOMY WITH RADIOACTIVE SEED LOCALIZATION;  Surgeon: Erroll Luna, MD;  Location: [facility];  Service: General;  Laterality: Right;  . CESAREAN SECTION  1997  . COLONOSCOPY  2007   [facility] - polyp  . DILATION AND CURETTAGE OF UTERUS    . ECTOPIC PREGNANCY SURGERY  2000   laparoscopy  . oral biopsy      Family History  Problem Relation Age of Onset  . Lung cancer Mother   . Cancer Mother        lung   . Prostate cancer Father   . Cancer Father        prostate  . Diabetes Father   . Hyperlipidemia Father   . Hypertension Father   . Asthma Father   . Cancer Maternal Aunt        breast  . Cancer Maternal Aunt        breast  . Cancer Maternal Aunt        breast  . Colon polyps Neg Hx   . Esophageal cancer Neg Hx   . Rectal cancer Neg Hx   . Stomach cancer Neg Hx     Social  History   Socioeconomic History  . Marital status: Married    Spouse name: Not on file  . Number of children: Not on file  . Years of education: Not on file  . Highest education level: Not on file  Occupational History  . Not on file  Tobacco Use  . Smoking status: Never Smoker  . Smokeless tobacco: Never Used  Substance and Sexual Activity  . Alcohol use: Yes    Comment: social wine  . Drug use: No  . Sexual activity: Not Currently    Birth control/protection: I.U.D.    Comment: mirena - inserted in 2015  Other Topics Concern  . 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 Difficulty of Paying Living Expenses: Not on file  Food Insecurity:   . Worried About Charity fundraiser in the Last Year: Not on file  . Ran Out of Food in the Last Year: Not on file  Transportation Needs:   . Lack of Transportation (Medical): Not on file  . Lack of Transportation (Non-Medical): Not on file  Physical Activity:   . Days of Exercise per Week: Not on file  . Minutes of Exercise per Session: Not on file  Stress:   . Feeling of Stress : Not on file  Social Connections:   . Frequency of Communication with Friends and Family: Not on file  . Frequency of Social Gatherings with Friends and Family: Not on file  . Attends Religious Services: Not on file  . Active Member of Clubs or Organizations: Not on file  . Attends Archivist Meetings: Not on file  . Marital Status: Not on file    Outpatient Medications Prior to Visit  Medication Sig Dispense Refill  . acetaminophen (TYLENOL) 325 MG tablet Take 2 tablets (650 mg total) by mouth every 8 (eight) hours as needed for pain (headache). 100 tablet 2  . aspirin 81 MG EC tablet Take 1 tablet (81 mg total) by mouth daily.  Swallow whole.  100 tablet 2  . atorvastatin (LIPITOR) 20 MG tablet TAKE 1 TABLET BY MOUTH  DAILY 90 tablet 3  . Biotin 1000 MCG tablet Take 2,000 mcg by mouth daily.     . cetirizine (ZYRTEC) 10 MG tablet TAKE 1 TABLET BY MOUTH DAILY (Patient taking differently: Take 10 mg by mouth as needed for allergies. ) 30 tablet 11  . fluticasone (FLONASE) 50 MCG/ACT nasal spray 2 spray each nostril qd 16 g 11  . ibuprofen (ADVIL) 800 MG tablet Take 800 mg by mouth every 8 (eight) hours as needed for moderate pain.    Marland Kitchen JARDIANCE 10 MG TABS tablet TAKE 1 TABLET BY MOUTH  DAILY 90 tablet 0  . Multiple Vitamin (MULTI-VITAMIN PO) Take by mouth.    Meribeth Mattes SENSITIVE 0.5-0.9 % SOLN Place 1 drop into both eyes 3 (three) times daily.   11  . telmisartan-hydrochlorothiazide (MICARDIS HCT) 80-12.5 MG tablet TAKE 1 TABLET BY MOUTH  DAILY 90 tablet 3  . VITAMIN D PO Take 1 capsule by mouth daily.    . cyclobenzaprine (FLEXERIL) 10 MG tablet cyclobenzaprine 10 mg tablet  TAKE 1 TABLET BY MOUTH THREE TIMES DAILY (Patient not taking: Reported on 02/17/2020)    . naproxen (NAPROSYN) 500 MG tablet naproxen 500 mg tablet  TAKE 1 TABLET BY MOUTH TWICE DAILY (Patient not taking: Reported on 02/17/2020)    . omeprazole (PRILOSEC) 20 MG capsule Take 1 capsule (20 mg total) by mouth daily. (Patient not taking: Reported on 02/17/2020) 30 capsule 1   No facility-administered medications prior to visit.     EXAM:  BP 118/66   Pulse 64   Temp 98.9 F (37.2 C) (Oral)   Ht 5\' 6"  (1.676 m)   Wt 193 lb (87.5 kg)   SpO2 99%   BMI 31.15 kg/m   Body mass index is 31.15 kg/m. Wt Readings from Last 3 Encounters:  02/17/20 193 lb (87.5 kg)  07/03/19 193 lb (87.5 kg)  12/11/18 170 lb (77.1 kg)    Physical Exam: Vital signs reviewed XTG:GYIR is a well-developed well-nourished alert cooperative    who appearsr stated age in no acute distress.  HEENT: normocephalic atraumatic , Eyes: PERRL EOM's full, conjunctiva clear,  Nares: paten,t no deformity discharge or tenderness., Ears: no deformity EAC's clear TMs with normal landmarks. Mouth: masked  NECK: supple without masses, thyromegaly or bruits. CHEST/PULM:  Clear to auscultation and percussion breath sounds equal no wheeze , rales or rhonchi. No chest wall deformities or tenderness. Breast: normal by inspection . No dimpling, discharge, masses, tenderness or discharge . CV: PMI is nondisplaced, S1 S2 no gallops, murmurs, rubs. Peripheral pulses are full without delay.No JVD .  ABDOMEN: Bowel sounds normal nontender  No guard or rebound, no hepato splenomegal no CVA tenderness.  No hernia. Extremtities:  No clubbing cyanosis or edema, no acute joint swelling or redness no focal atrophy NEURO:  Oriented x3, cranial nerves 3-12 appear to be intact, no obvious focal weakness,gait within normal limits no abnormal reflexes or asymmetrical SKIN: No acute rashes normal turgor, color, no bruising or petechiae. PSYCH: Oriented, good eye contact, no obvious depression anxiety, cognition and judgment appear normal. LN: no cervical axillary inguinal adenopathy Diabetic Foot Exam - Simple   Simple Foot Form Diabetic Foot exam was performed with the following findings: Yes 02/17/2020  9:34 AM  Visual Inspection No deformities, no ulcerations, no other skin breakdown bilaterally: Yes Sensation Testing Intact to touch and monofilament testing bilaterally: Yes Pulse  Check Posterior Tibialis and Dorsalis pulse intact bilaterally: Yes Comments     Lab Results  Component Value Date   WBC 8.4 01/21/2019   HGB 14.0 01/21/2019   HCT 45.2 01/21/2019   PLT 419 (H) 01/21/2019   GLUCOSE 83 01/21/2019   CHOL 204 (H) 11/27/2018   TRIG 60.0 11/27/2018   HDL 59.80 11/27/2018   LDLDIRECT 162.5 03/07/2013   LDLCALC 132 (H) 11/27/2018   ALT 15 11/27/2018   AST 18 11/27/2018   NA 139 01/21/2019   K 3.9 01/21/2019   CL 103 01/21/2019   CREATININE 1.05 (H) 01/21/2019   BUN 12  01/21/2019   CO2 27 01/21/2019   TSH 1.12 11/27/2018   HGBA1C 5.2 07/03/2019   MICROALBUR 1.0 11/27/2018    BP Readings from Last 3 Encounters:  02/17/20 118/66  07/03/19 140/90  01/21/19 126/69    Labplan reviewed with patient   ASSESSMENT AND PLAN:  Discussed the following assessment and plan:    ICD-10-CM   1. Visit for preventive health examination  Z00.00 Hemoglobin A1c    TSH    Hepatic function panel    Lipid panel    BASIC METABOLIC PANEL WITH GFR    CBC with Differential/Platelet    Microalbumin / creatinine urine ratio    CBC with Differential/Platelet    BASIC METABOLIC PANEL WITH GFR    Lipid panel    Hepatic function panel    TSH    Hemoglobin A1c  2. Essential hypertension  I10 Hemoglobin A1c    TSH    Hepatic function panel    Lipid panel    BASIC METABOLIC PANEL WITH GFR    CBC with Differential/Platelet    Microalbumin / creatinine urine ratio    CBC with Differential/Platelet    BASIC METABOLIC PANEL WITH GFR    Lipid panel    Hepatic function panel    TSH    Hemoglobin A1c  3. Type 2 diabetes mellitus with other specified complication, without long-term current use of insulin (HCC)  E11.69 Hemoglobin A1c    TSH    Hepatic function panel    Lipid panel    BASIC METABOLIC PANEL WITH GFR    CBC with Differential/Platelet    Microalbumin / creatinine urine ratio    CBC with Differential/Platelet    BASIC METABOLIC PANEL WITH GFR    Lipid panel    Hepatic function panel    TSH    Hemoglobin A1c  4. Elevated platelet count  R79.89 Hemoglobin A1c    TSH    Hepatic function panel    Lipid panel    BASIC METABOLIC PANEL WITH GFR    CBC with Differential/Platelet    Microalbumin / creatinine urine ratio    CBC with Differential/Platelet    BASIC METABOLIC PANEL WITH GFR    Lipid panel    Hepatic function panel    TSH    Hemoglobin A1c  5. Medication management  Z79.899 Hemoglobin A1c    TSH    Hepatic function panel    Lipid panel     BASIC METABOLIC PANEL WITH GFR    CBC with Differential/Platelet    Microalbumin / creatinine urine ratio    CBC with Differential/Platelet    BASIC METABOLIC PANEL WITH GFR    Lipid panel    Hepatic function panel    TSH    Hemoglobin A1c  6. Hyperlipidemia, unspecified hyperlipidemia type  E78.5 Hemoglobin A1c    TSH  Hepatic function panel    Lipid panel    BASIC METABOLIC PANEL WITH GFR    CBC with Differential/Platelet    Microalbumin / creatinine urine ratio    CBC with Differential/Platelet    BASIC METABOLIC PANEL WITH GFR    Lipid panel    Hepatic function panel    TSH    Hemoglobin A1c    She will get her Cologuard done discussed shingles vaccine ask questions she may make an appointment for injection. Monitoring labs today no medication change unless indicated. Agree with getting her weight back down. She will return if new concerns in the interim otherwise  If plt count up   Check for  Iron  studies  Return in about 6 months (around 08/16/2020) for a1c med.  Patient Care Team: Sedona Wenk, Standley Brooking, MD as PCP - Elmer Sow, PA-C as Physician Assistant (Obstetrics and Gynecology) Debbra Riding, MD as Consulting Physician (Ophthalmology) Patient Instructions  Will notify you  of labs when available.  Glad you are doing well.  Continue lifestyle intervention healthy eating and exercise .      Health Maintenance, Female Adopting a healthy lifestyle and getting preventive care are important in promoting health and wellness. Ask your health care provider about:  The right schedule for you to have regular tests and exams.  Things you can do on your own to prevent diseases and keep yourself healthy. What should I know about diet, weight, and exercise? Eat a healthy diet   Eat a diet that includes plenty of vegetables, fruits, low-fat dairy products, and lean protein.  Do not eat a lot of foods that are high in solid fats, added sugars, or  sodium. Maintain a healthy weight Body mass index (BMI) is used to identify weight problems. It estimates body fat based on height and weight. Your health care provider can help determine your BMI and help you achieve or maintain a healthy weight. Get regular exercise Get regular exercise. This is one of the most important things you can do for your health. Most adults should:  Exercise for at least 150 minutes each week. The exercise should increase your heart rate and make you sweat (moderate-intensity exercise).  Do strengthening exercises at least twice a week. This is in addition to the moderate-intensity exercise.  Spend less time sitting. Even light physical activity can be beneficial. Watch cholesterol and blood lipids Have your blood tested for lipids and cholesterol at 56 years of age, then have this test every 5 years. Have your cholesterol levels checked more often if:  Your lipid or cholesterol levels are high.  You are older than 56 years of age.  You are at high risk for heart disease. What should I know about cancer screening? Depending on your health history and family history, you may need to have cancer screening at various ages. This may include screening for:  Breast cancer.  Cervical cancer.  Colorectal cancer.  Skin cancer.  Lung cancer. What should I know about heart disease, diabetes, and high blood pressure? Blood pressure and heart disease  High blood pressure causes heart disease and increases the risk of stroke. This is more likely to develop in people who have high blood pressure readings, are of African descent, or are overweight.  Have your blood pressure checked: ? Every 3-5 years if you are 74-9 years of age. ? Every year if you are 56 years old or older. Diabetes Have regular diabetes screenings. This checks your  fasting blood sugar level. Have the screening done:  Once every three years after age 56 if you are at a normal weight and have  a low risk for diabetes.  More often and at a younger age if you are overweight or have a high risk for diabetes. What should I know about preventing infection? Hepatitis B If you have a higher risk for hepatitis B, you should be screened for this virus. Talk with your health care provider to find out if you are at risk for hepatitis B infection. Hepatitis C Testing is recommended for:  Everyone born from [birth year] through 1967.  Anyone with known risk factors for hepatitis C. Sexually transmitted infections (STIs)  Get screened for STIs, including gonorrhea and chlamydia, if: ? You are sexually active and are younger than 56 years of age. ? You are older than 56 years of age and your health care provider tells you that you are at risk for this type of infection. ? Your sexual activity has changed since you were last screened, and you are at increased risk for chlamydia or gonorrhea. Ask your health care provider if you are at risk.  Ask your health care provider about whether you are at high risk for HIV. Your health care provider may recommend a prescription medicine to help prevent HIV infection. If you choose to take medicine to prevent HIV, you should first get tested for HIV. You should then be tested every 3 months for as long as you are taking the medicine. Pregnancy  If you are about to stop having your period (premenopausal) and you may become pregnant, seek counseling before you get pregnant.  Take 400 to 800 micrograms (mcg) of folic acid every day if you become pregnant.  Ask for birth control (contraception) if you want to prevent pregnancy. Osteoporosis and menopause Osteoporosis is a disease in which the bones lose minerals and strength with aging. This can result in bone fractures. If you are 51 years old or older, or if you are at risk for osteoporosis and fractures, ask your health care provider if you should:  Be screened for bone loss.  Take a calcium or vitamin D  supplement to lower your risk of fractures.  Be given hormone replacement therapy (HRT) to treat symptoms of menopause. Follow these instructions at home: Lifestyle  Do not use any products that contain nicotine or tobacco, such as cigarettes, e-cigarettes, and chewing tobacco. If you need help quitting, ask your health care provider.  Do not use street drugs.  Do not share needles.  Ask your health care provider for help if you need support or information about quitting drugs. Alcohol use  Do not drink alcohol if: ? Your health care provider tells you not to drink. ? You are pregnant, may be pregnant, or are planning to become pregnant.  If you drink alcohol: ? Limit how much you use to 0-1 drink a day. ? Limit intake if you are breastfeeding.  Be aware of how much alcohol is in your drink. In the [place] one drink equals one 12 oz bottle of beer (355 mL), one 5 oz glass of wine (148 mL), or one 1 oz glass of hard liquor (44 mL). General instructions  Schedule regular health, dental, and eye exams.  Stay current with your vaccines.  Tell your health care provider if: ? You often feel depressed. ? You have ever been abused or do not feel safe at home. Summary  Adopting a healthy lifestyle  and getting preventive care are important in promoting health and wellness.  Follow your health care provider's instructions about healthy diet, exercising, and getting tested or screened for diseases.  Follow your health care provider's instructions on monitoring your cholesterol and blood pressure. This information is not intended to replace advice given to you by your health care provider. Make sure you discuss any questions you have with your health care provider. Document Revised: 03/14/2018 Document Reviewed: 03/14/2018 Elsevier Patient Education  2020 [place] Nyomie Ehrlich M.D.

## 2020-02-17 NOTE — Patient Instructions (Addendum)
Will notify you  of labs when available.  Glad you are doing well.  Continue lifestyle intervention healthy eating and exercise .      Health Maintenance, Female Adopting a healthy lifestyle and getting preventive care are important in promoting health and wellness. Ask your health care provider about:  The right schedule for you to have regular tests and exams.  Things you can do on your own to prevent diseases and keep yourself healthy. What should I know about diet, weight, and exercise? Eat a healthy diet   Eat a diet that includes plenty of vegetables, fruits, low-fat dairy products, and lean protein.  Do not eat a lot of foods that are high in solid fats, added sugars, or sodium. Maintain a healthy weight Body mass index (BMI) is used to identify weight problems. It estimates body fat based on height and weight. Your health care provider can help determine your BMI and help you achieve or maintain a healthy weight. Get regular exercise Get regular exercise. This is one of the most important things you can do for your health. Most adults should:  Exercise for at least 150 minutes each week. The exercise should increase your heart rate and make you sweat (moderate-intensity exercise).  Do strengthening exercises at least twice a week. This is in addition to the moderate-intensity exercise.  Spend less time sitting. Even light physical activity can be beneficial. Watch cholesterol and blood lipids Have your blood tested for lipids and cholesterol at 56 years of age, then have this test every 5 years. Have your cholesterol levels checked more often if:  Your lipid or cholesterol levels are high.  You are older than 56 years of age.  You are at high risk for heart disease. What should I know about cancer screening? Depending on your health history and family history, you may need to have cancer screening at various ages. This may include screening for:  Breast  cancer.  Cervical cancer.  Colorectal cancer.  Skin cancer.  Lung cancer. What should I know about heart disease, diabetes, and high blood pressure? Blood pressure and heart disease  High blood pressure causes heart disease and increases the risk of stroke. This is more likely to develop in people who have high blood pressure readings, are of African descent, or are overweight.  Have your blood pressure checked: ? Every 3-5 years if you are 51-22 years of age. ? Every year if you are 75 years old or older. Diabetes Have regular diabetes screenings. This checks your fasting blood sugar level. Have the screening done:  Once every three years after age 8 if you are at a normal weight and have a low risk for diabetes.  More often and at a younger age if you are overweight or have a high risk for diabetes. What should I know about preventing infection? Hepatitis B If you have a higher risk for hepatitis B, you should be screened for this virus. Talk with your health care provider to find out if you are at risk for hepatitis B infection. Hepatitis C Testing is recommended for:  Everyone born from [birth year] through 1965.  Anyone with known risk factors for hepatitis C. Sexually transmitted infections (STIs)  Get screened for STIs, including gonorrhea and chlamydia, if: ? You are sexually active and are younger than 56 years of age. ? You are older than 56 years of age and your health care provider tells you that you are at risk for this type of  infection. ? Your sexual activity has changed since you were last screened, and you are at increased risk for chlamydia or gonorrhea. Ask your health care provider if you are at risk.  Ask your health care provider about whether you are at high risk for HIV. Your health care provider may recommend a prescription medicine to help prevent HIV infection. If you choose to take medicine to prevent HIV, you should first get tested for HIV. You should  then be tested every 3 months for as long as you are taking the medicine. Pregnancy  If you are about to stop having your period (premenopausal) and you may become pregnant, seek counseling before you get pregnant.  Take 400 to 800 micrograms (mcg) of folic acid every day if you become pregnant.  Ask for birth control (contraception) if you want to prevent pregnancy. Osteoporosis and menopause Osteoporosis is a disease in which the bones lose minerals and strength with aging. This can result in bone fractures. If you are 68 years old or older, or if you are at risk for osteoporosis and fractures, ask your health care provider if you should:  Be screened for bone loss.  Take a calcium or vitamin D supplement to lower your risk of fractures.  Be given hormone replacement therapy (HRT) to treat symptoms of menopause. Follow these instructions at home: Lifestyle  Do not use any products that contain nicotine or tobacco, such as cigarettes, e-cigarettes, and chewing tobacco. If you need help quitting, ask your health care provider.  Do not use street drugs.  Do not share needles.  Ask your health care provider for help if you need support or information about quitting drugs. Alcohol use  Do not drink alcohol if: ? Your health care provider tells you not to drink. ? You are pregnant, may be pregnant, or are planning to become pregnant.  If you drink alcohol: ? Limit how much you use to 0-1 drink a day. ? Limit intake if you are breastfeeding.  Be aware of how much alcohol is in your drink. In the [place] one drink equals one 12 oz bottle of beer (355 mL), one 5 oz glass of wine (148 mL), or one 1 oz glass of hard liquor (44 mL). General instructions  Schedule regular health, dental, and eye exams.  Stay current with your vaccines.  Tell your health care provider if: ? You often feel depressed. ? You have ever been abused or do not feel safe at home. Summary  Adopting a  healthy lifestyle and getting preventive care are important in promoting health and wellness.  Follow your health care provider's instructions about healthy diet, exercising, and getting tested or screened for diseases.  Follow your health care provider's instructions on monitoring your cholesterol and blood pressure. This information is not intended to replace advice given to you by your health care provider. Make sure you discuss any questions you have with your health care provider. Document Revised: 03/14/2018 Document Reviewed: 03/14/2018 Elsevier Patient Education  2020 [employer].

## 2020-02-18 LAB — CBC WITH DIFFERENTIAL/PLATELET
Absolute Monocytes: 299 cells/uL (ref 200–950)
Basophils Absolute: 20 cells/uL (ref 0–200)
Basophils Relative: 0.6 %
Eosinophils Absolute: 109 cells/uL (ref 15–500)
Eosinophils Relative: 3.2 %
HCT: 39.4 % (ref 35.0–45.0)
Hemoglobin: 12.9 g/dL (ref 11.7–15.5)
Lymphs Abs: 1251 cells/uL (ref 850–3900)
MCH: 28.9 pg (ref 27.0–33.0)
MCHC: 32.7 g/dL (ref 32.0–36.0)
MCV: 88.1 fL (ref 80.0–100.0)
MPV: 9.8 fL (ref 7.5–12.5)
Monocytes Relative: 8.8 %
Neutro Abs: 1720 cells/uL (ref 1500–7800)
Neutrophils Relative %: 50.6 %
Platelets: 349 10*3/uL (ref 140–400)
RBC: 4.47 10*6/uL (ref 3.80–5.10)
RDW: 12.2 % (ref 11.0–15.0)
Total Lymphocyte: 36.8 %
WBC: 3.4 10*3/uL — ABNORMAL LOW (ref 3.8–10.8)

## 2020-02-18 LAB — BASIC METABOLIC PANEL WITH GFR
BUN/Creatinine Ratio: 15 (calc) (ref 6–22)
BUN: 16 mg/dL (ref 7–25)
CO2: 31 mmol/L (ref 20–32)
Calcium: 9.9 mg/dL (ref 8.6–10.4)
Chloride: 105 mmol/L (ref 98–110)
Creat: 1.07 mg/dL — ABNORMAL HIGH (ref 0.50–1.05)
GFR, Est African American: 67 mL/min/{1.73_m2} (ref >=60)
GFR, Est Non African American: 58 mL/min/{1.73_m2} — ABNORMAL LOW (ref >=60)
Glucose, Bld: 82 mg/dL (ref 65–99)
Potassium: 4.8 mmol/L (ref 3.5–5.3)
Sodium: 145 mmol/L (ref 135–146)

## 2020-02-18 LAB — HEPATIC FUNCTION PANEL
AG Ratio: 1.3 (calc) (ref 1.0–2.5)
ALT: 12 U/L (ref 6–29)
AST: 19 U/L (ref 10–35)
Albumin: 4.3 g/dL (ref 3.6–5.1)
Alkaline phosphatase (APISO): 59 U/L (ref 37–153)
Bilirubin, Direct: 0.1 mg/dL (ref 0.0–0.2)
Globulin: 3.2 g/dL (calc) (ref 1.9–3.7)
Indirect Bilirubin: 0.4 mg/dL (calc) (ref 0.2–1.2)
Total Bilirubin: 0.5 mg/dL (ref 0.2–1.2)
Total Protein: 7.5 g/dL (ref 6.1–8.1)

## 2020-02-18 LAB — LIPID PANEL
Cholesterol: 242 mg/dL — ABNORMAL HIGH (ref <200)
HDL: 60 mg/dL (ref >=50)
LDL Cholesterol (Calc): 165 mg/dL (calc) — ABNORMAL HIGH
Non-HDL Cholesterol (Calc): 182 mg/dL (calc) — ABNORMAL HIGH (ref <130)
Total CHOL/HDL Ratio: 4 (calc) (ref <5.0)
Triglycerides: 73 mg/dL (ref <150)

## 2020-02-18 LAB — HEMOGLOBIN A1C
Hgb A1c MFr Bld: 5.4 % of total Hgb (ref <5.7)
Mean Plasma Glucose: 108 (calc)
eAG (mmol/L): 6 (calc)

## 2020-02-18 LAB — TSH: TSH: 1.06 mIU/L (ref 0.40–4.50)

## 2020-02-22 ENCOUNTER — Other Ambulatory Visit: Payer: Self-pay | Admitting: Internal Medicine

## 2020-02-24 NOTE — Progress Notes (Signed)
So as you can see lab work results are stable except your cholesterol is a lot higher than previous year. Blood sugar is great  down to 5.4  still  Goal is to have the LDL level below 100 and preferably closer to 70.Please clarify if you been if you have been taking the atorvastatin every day or missing some days and how many  x in a week or month.We can make a decision about changing med or intensifying therapy based on that knowledge. And then plan   lipid follow up

## 2020-04-01 ENCOUNTER — Other Ambulatory Visit: Payer: Self-pay | Admitting: Internal Medicine

## 2020-08-10 LAB — HM DIABETES EYE EXAM

## 2020-08-20 ENCOUNTER — Telehealth: Payer: Self-pay

## 2020-08-20 NOTE — Telephone Encounter (Signed)
Left a message for the patient to return my call in regards to cologuard.

## 2020-08-25 ENCOUNTER — Encounter: Payer: Self-pay | Admitting: Internal Medicine

## 2020-10-22 ENCOUNTER — Encounter: Payer: Self-pay | Admitting: Internal Medicine

## 2020-10-22 ENCOUNTER — Telehealth: Payer: 59 | Admitting: Internal Medicine

## 2020-10-22 VITALS — Ht 66.0 in | Wt 193.0 lb

## 2020-10-22 DIAGNOSIS — Z79899 Other long term (current) drug therapy: Secondary | ICD-10-CM

## 2020-10-22 DIAGNOSIS — U071 COVID-19: Secondary | ICD-10-CM | POA: Diagnosis not present

## 2020-10-22 DIAGNOSIS — J988 Other specified respiratory disorders: Secondary | ICD-10-CM

## 2020-10-22 DIAGNOSIS — J309 Allergic rhinitis, unspecified: Secondary | ICD-10-CM

## 2020-10-22 MED ORDER — CETIRIZINE HCL 10 MG PO TABS: 10.0000 mg | ORAL_TABLET | Freq: Every day | ORAL | 11 refills | Status: DC

## 2020-10-22 NOTE — Progress Notes (Signed)
Virtual Visit via Video Note  I connected withNAME@ on 10/22/20 at  8:30 AM EDT by a video enabled telemedicine application and verified that I am speaking with the correct person using two identifiers. Location patient: home Location provider:home office Persons participating in the virtual visit: patient, provider  [place] national recommendations  regarding COVID 19 pandemic   video visit is advised over in office visit for this patient.  Patient aware  of the limitations of evaluation and management by telemedicine and  availability of in person appointments. and agreed to proceed.   HPI: [employer] presents for video visit Onset feeling chilled and feverish Friday, July 15 evening rapid test next day was positive chills upper respiratory congestion and minor cough loss of taste and smell follow-up PCR was also positive done on July 18 as per work.  Since that time her fever chills have resolved but has ongoing fatigue cough upper airway with feels like phlegm but it is nonproductive upper respiratory congestion without pain and nasal drip.  Had diarrhea 1 time. Will need a note for verification about return to work. Also asked for refill request for her Zyrtec as she is run out of it.     ROS: See pertinent positives and negatives per HPI.  Past Medical History:  Diagnosis Date   Allergic rhinitis    Allergy    Anxiety    Chest pain    Hx, no problems as of 08/10/15   Depression    Diabetes mellitus    Type II   Dysmenorrhea    Fatigue    Hx, no current problems as of 08/10/15   Fibroids    GERD (gastroesophageal reflux disease)    diet controlled, no meds   Hyperlipidemia    Hypertension    IBS (irritable bowel syndrome)    no meds, diet controlled   Impaired fasting glucose    Knee pain, bilateral    Hx, no current problems as of 08/10/15   Leukoplakia oral mucosa    2011   Menorrhagia    OSA (obstructive sleep apnea)    does not use cpap, never went to  get machine   Other dysfunctions of sleep stages or arousal from sleep    Sleep apnea    Does not use CPAP   Snoring    had a sleep study mild osa 2009    Past Surgical History:  Procedure Laterality Date   BREAST LUMPECTOMY WITH RADIOACTIVE SEED LOCALIZATION Right 03/05/2015   Procedure: RIGHT BREAST LUMPECTOMY WITH RADIOACTIVE SEED LOCALIZATION;  Surgeon: Erroll Luna, MD;  Location: [facility];  Service: General;  Laterality: Right;   [place]   COLONOSCOPY  2007   [place] - polyp   DILATION AND CURETTAGE OF UTERUS     ECTOPIC PREGNANCY SURGERY  2000   laparoscopy   oral biopsy      Family History  Problem Relation Age of Onset   Lung cancer Mother    Cancer Mother        lung    Prostate cancer Father    Cancer Father        prostate   Diabetes Father    Hyperlipidemia Father    Hypertension Father    Asthma Father    Cancer Maternal Aunt        breast   Cancer Maternal Aunt        breast   Cancer Maternal Aunt  breast   Colon polyps Neg Hx    Esophageal cancer Neg Hx    Rectal cancer Neg Hx    Stomach cancer Neg Hx     Social History   Tobacco Use   Smoking status: Never   Smokeless tobacco: Never  Substance Use Topics   Alcohol use: Yes    Comment: social wine   Drug use: No      Current Outpatient Medications:    acetaminophen (TYLENOL) 325 MG tablet, Take 2 tablets (650 mg total) by mouth every 8 (eight) hours as needed for pain (headache)., Disp: 100 tablet, Rfl: 2   aspirin 81 MG EC tablet, Take 1 tablet (81 mg total) by mouth daily. Swallow whole., Disp: 100 tablet, Rfl: 2   atorvastatin (LIPITOR) 20 MG tablet, TAKE 1 TABLET BY MOUTH  DAILY, Disp: 90 tablet, Rfl: 3   Biotin 1000 MCG tablet, Take 2,000 mcg by mouth daily. , Disp: , Rfl:    cyclobenzaprine (FLEXERIL) 10 MG tablet, cyclobenzaprine 10 mg tablet  TAKE 1 TABLET BY MOUTH THREE TIMES DAILY, Disp: , Rfl:    fluticasone (FLONASE) 50 MCG/ACT nasal  spray, 2 spray each nostril qd, Disp: 16 g, Rfl: 11   ibuprofen (ADVIL) 800 MG tablet, Take 800 mg by mouth every 8 (eight) hours as needed for moderate pain., Disp: , Rfl:    JARDIANCE 10 MG TABS tablet, TAKE 1 TABLET BY MOUTH  DAILY, Disp: 90 tablet, Rfl: 3   Multiple Vitamin (MULTI-VITAMIN PO), Take by mouth., Disp: , Rfl:    omeprazole (PRILOSEC) 20 MG capsule, Take 1 capsule (20 mg total) by mouth daily., Disp: 30 capsule, Rfl: 1   OPTIVE SENSITIVE 0.5-0.9 % SOLN, Place 1 drop into both eyes 3 (three) times daily. , Disp: , Rfl: 11   telmisartan-hydrochlorothiazide (MICARDIS HCT) 80-12.5 MG tablet, TAKE 1 TABLET BY MOUTH  DAILY, Disp: 90 tablet, Rfl: 3   VITAMIN D PO, Take 1 capsule by mouth daily., Disp: , Rfl:    cetirizine (ZYRTEC) 10 MG tablet, Take 1 tablet (10 mg total) by mouth daily., Disp: 30 tablet, Rfl: 11  EXAM: BP Readings from Last 3 Encounters:  02/17/20 118/66  07/03/19 140/90  01/21/19 126/69    VITALS per patient if applicable:  GENERAL: alert, oriented, appears well and in no acute distress non toxic   HEENT: atraumatic, conjunttiva clear, no obvious abnormalities on inspection of external nose and ears  NECK: normal movements of the head and neck  LUNGS: on inspection no signs of respiratory distress, breathing rate appears normal, no obvious gross SOB, gasping or wheezing  CV: no obvious cyanosis  MS: moves all visible extremities without noticeable abnormality  PSYCH/NEURO: pleasant and cooperative, no obvious depression or anxiety, speech and thought processing grossly intact Lab Results  Component Value Date   WBC 3.4 (L) 02/17/2020   HGB 12.9 02/17/2020   HCT 39.4 02/17/2020   PLT 349 02/17/2020   GLUCOSE 82 02/17/2020   CHOL 242 (H) 02/17/2020   TRIG 73 02/17/2020   HDL 60 02/17/2020   LDLDIRECT 162.5 03/07/2013   LDLCALC 165 (H) 02/17/2020   ALT 12 02/17/2020   AST 19 02/17/2020   NA 145 02/17/2020   K 4.8 02/17/2020   CL 105 02/17/2020    CREATININE 1.07 (H) 02/17/2020   BUN 16 02/17/2020   CO2 31 02/17/2020   TSH 1.06 02/17/2020   HGBA1C 5.4 02/17/2020   MICROALBUR 1.0 11/27/2018    ASSESSMENT AND PLAN:  Discussed the following assessment and plan:    ICD-10-CM   1. Respiratory tract infection due to COVID-19 virus  U07.1    J98.8    Vaccinated medium risk no apparent complication    2. Medication management  Z79.899     3. Allergic rhinitis, unspecified seasonality, unspecified trigger  J30.9       Day 7 COVID respiratory infection without obvious complication still symptoms but fever gone discussed seeking care for alarm symptoms.  Continue aggressive supportive therapy and care as needed. Note will be composed for work to return on July 27 if continuing to improve. Will refill her cetirizine as requested Counseled.   Expectant management and discussion of plan and treatment with opportunity to ask questions and all were answered. The patient agreed with the plan and demonstrated an understanding of the instructions.  32 minutes review plan letter  counsel.  Advised to call back or seek an in-person evaluation if worsening  or having  further concerns . Return if symptoms worsen or fail to improve as expected.    Shanon Ace, MD

## 2020-12-24 ENCOUNTER — Other Ambulatory Visit: Payer: Self-pay | Admitting: Internal Medicine

## 2021-01-07 ENCOUNTER — Other Ambulatory Visit: Payer: Self-pay | Admitting: Internal Medicine

## 2021-01-08 NOTE — Telephone Encounter (Signed)
LVM for pt to call to schedule annual appt.

## 2021-03-04 ENCOUNTER — Other Ambulatory Visit: Payer: Self-pay | Admitting: Internal Medicine

## 2021-03-16 ENCOUNTER — Other Ambulatory Visit: Payer: Self-pay | Admitting: Internal Medicine

## 2021-07-25 NOTE — Progress Notes (Signed)
? ?Chief Complaint  ?Patient presents with  ? Annual Exam  ?  fasting  ? ? ?HPI: ?Patient  Crystal Townsend  56 y.o. comes in today for Preventive Health Care visit  ?She is overdue for medication evaluation and lab testing. ?Does not know what her blood sugar is but has been taking Jardiance faithfully ?Has been taking her hypertension medicine telmisartan HCTZ regularly but has not been taking in the atorvastatin for the last 60 days or so ?Request refill for aspirin zyrtec  Jardiance Flonase and telmisartan HCTZ. ?Rare use of ibuprofen every 3 to 4 months. ? ?Sees GYN  nl exam 4 23  ?Health Maintenance  ?Topic Date Due  ? FOOT EXAM  01/25/2022 (Originally 02/16/2021)  ? HEMOGLOBIN A1C  01/25/2022 (Originally 08/16/2020)  ? COLONOSCOPY (Pts 45-56yr Insurance coverage will need to be confirmed)  01/25/2022 (Originally 08/06/2015)  ? COVID-19 Vaccine (6 - Booster) 01/25/2022 (Originally 05/26/2020)  ? HIV Screening  01/25/2022 (Originally 09/15/1978)  ? Zoster Vaccines- Shingrix (1 of 2) 01/25/2022 (Originally 09/15/1982)  ? OPHTHALMOLOGY EXAM  08/10/2021  ? INFLUENZA VACCINE  11/02/2021  ? MAMMOGRAM  07/24/2023  ? PAP SMEAR-Modifier  07/23/2024  ? TETANUS/TDAP  10/19/2025  ? Hepatitis C Screening  Completed  ? HPV VACCINES  Aged Out  ? ?Health Maintenance Review ?LIFESTYLE:  ?Exercise:   walking  post surgery not lifting.   ?Tobacco/ETS: no ?Alcohol:  2 x per month  ?Sugar beverages: Had been sugar-free but recent  increase  sweet  tea working on cutting that out. 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 ? ?ROS:    ? Constipation .  [place] about 7 months   planned   colonscopy. Referred   ?At nighttime hands can turn numb and she thinks it could be circulation so if she puts her arm dependent over the side of the bed it does not happen.  No numbness during the day no peripheral neuropathy symptoms. ? ?GEN/  HEENT: No fever, significant weight changes sweats headaches vision problems hearing changes, ?CV/ PULM; No chest pain shortness of breath cough, syncope,edema  change in exercise tolerance. ?GI /GU: No adominal pain, vomiting,  ?SKIN/HEME: ,no acute skin rashes suspicious lesions or bleeding. No lymphadenopathy, nodules, masses.  ?NEURO/ PSYCH:   No depression anxiety. ?IMM/ Allergy: No unusual infections.  Allergy .   ?REST of 12 system review negative except as per HPI ? ? ?Past Medical History:  ?Diagnosis Date  ? Allergic rhinitis   ? Allergy   ? Anxiety   ? Chest pain   ? Hx, no problems as of 08/10/15  ? Depression   ? Diabetes mellitus   ? Type II  ? Dysmenorrhea   ? Fatigue   ? Hx, no current problems as of 08/10/15  ? Fibroids   ? GERD (gastroesophageal reflux disease)   ? diet controlled, no meds  ? Hyperlipidemia   ? Hypertension   ? IBS (irritable bowel syndrome)   ? no meds, diet controlled  ? Impaired fasting glucose   ? Knee pain, bilateral   ? Hx, no current problems as of 08/10/15  ? Leukoplakia oral mucosa   ? 2011  ? Menorrhagia   ? OSA (obstructive sleep apnea)   ? does not use cpap, never went to get machine  ?  Other dysfunctions of sleep stages or arousal from sleep   ? Sleep apnea   ? Does not use CPAP  ? Snoring   ? had a sleep study mild osa 2009  ? ? ?Past Surgical History:  ?Procedure Laterality Date  ? BREAST LUMPECTOMY WITH RADIOACTIVE SEED LOCALIZATION Right 03/05/2015  ? Procedure: RIGHT BREAST LUMPECTOMY WITH RADIOACTIVE SEED LOCALIZATION;  Surgeon: Erroll Luna, MD;  Location: [facility];  Service: General;  Laterality: Right;  ? [place]  ? COLONOSCOPY  2007  ? [facility] - polyp  ? DILATION AND CURETTAGE OF UTERUS    ? ECTOPIC PREGNANCY SURGERY  2000  ? laparoscopy  ? oral biopsy    ? ? ?Family History  ?Problem Relation Age of Onset  ? Lung cancer Mother   ? Cancer Mother   ?     lung   ? Prostate cancer Father   ? Cancer Father   ?     prostate  ? Diabetes  Father   ? Hyperlipidemia Father   ? Hypertension Father   ? Asthma Father   ? Cancer Maternal Aunt   ?     breast  ? Cancer Maternal Aunt   ?     breast  ? Cancer Maternal Aunt   ?     breast  ? Colon polyps Neg Hx   ? Esophageal cancer Neg Hx   ? Rectal cancer Neg Hx   ? Stomach cancer Neg Hx   ? ? ?Social History  ? ?Socioeconomic History  ? Marital status: Single  ?  Spouse name: Not on file  ? Number of children: Not on file  ? Years of education: Not on file  ? Highest education level: Not on file  ?Occupational History  ? Not on file  ?Tobacco Use  ? Smoking status: Never  ? Smokeless tobacco: Never  ?Substance and Sexual Activity  ? Alcohol use: Yes  ?  Comment: social wine  ? Drug use: No  ? Sexual activity: Not Currently  ?  Birth control/protection: I.U.D.  ?  Comment: mirena - inserted in 2015  ?Other Topics Concern  ? Not on file  ?Social History Narrative  ? Married husband left suddenly  ? Single  ? Customer service rep  ?  [place] has graduated and still works  ? Son 63 doing ok working retail  ? ?Social Determinants of Health  ? ?Financial Resource Strain: Not on file  ?Food Insecurity: Not on file  ?Transportation Needs: Not on file  ?Physical Activity: Not on file  ?Stress: Not on file  ?Social Connections: Not on file  ? ? ?Outpatient Medications Prior to Visit  ?Medication Sig Dispense Refill  ? acetaminophen (TYLENOL) 325 MG tablet Take 2 tablets (650 mg total) by mouth every 8 (eight) hours as needed for pain (headache). 100 tablet 2  ? atorvastatin (LIPITOR) 20 MG tablet TAKE 1 TABLET BY MOUTH ONCE DAILY 30 tablet 0  ? Biotin 1000 MCG tablet Take 2,000 mcg by mouth daily.     ? ibuprofen (ADVIL) 800 MG tablet Take 800 mg by mouth every 8 (eight) hours as needed for moderate pain.    ? Multiple Vitamin (MULTI-VITAMIN PO) Take by mouth.    ? VITAMIN D PO Take 1 capsule by mouth daily.    ? cetirizine (ZYRTEC) 10 MG tablet Take 1 tablet (10 mg total) by mouth daily. 30 tablet 11  ?  fluticasone (FLONASE) 50  MCG/ACT nasal spray 2 spray each nostril qd 16 g 11  ? JARDIANCE 10 MG TABS tablet TAKE 1 TABLET BY MOUTH  DAILY 30 tablet 0  ? telmisartan-hydrochlorothiazide (MICARDIS HCT) 80-12.5 MG tablet Take 1 tablet by mouth daily. 90 tablet 0  ? cyclobenzaprine (FLEXERIL) 10 MG tablet cyclobenzaprine 10 mg tablet ? TAKE 1 TABLET BY MOUTH THREE TIMES DAILY (Patient not taking: Reported on 07/26/2021)    ? omeprazole (PRILOSEC) 20 MG capsule Take 1 capsule (20 mg total) by mouth daily. (Patient not taking: Reported on 07/26/2021) 30 capsule 1  ? OPTIVE SENSITIVE 0.5-0.9 % SOLN Place 1 drop into both eyes 3 (three) times daily.   11  ? aspirin 81 MG EC tablet Take 1 tablet (81 mg total) by mouth daily. Swallow whole. 100 tablet 2  ? ?No facility-administered medications prior to visit.  ? ? ? ?EXAM: ? ?BP 110/70 (BP Location: Left Arm, Patient Position: Sitting, Cuff Size: Normal)   Pulse 60   Temp 98.8 ?F (37.1 ?C) (Oral)   Ht '5\' 6"'$  (1.676 m)   Wt 208 lb 6.4 oz (94.5 kg)   SpO2 99%   BMI 33.64 kg/m?  ? ?Body mass index is 33.64 kg/m?. ?Wt Readings from Last 3 Encounters:  ?07/26/21 208 lb 6.4 oz (94.5 kg)  ?10/22/20 193 lb (87.5 kg)  ?02/17/20 193 lb (87.5 kg)  ? ? ?Physical Exam: ?Vital signs reviewed ?YFV:CBSW is a well-developed well-nourished alert cooperative    who appearsr stated age in no acute distress.  ?HEENT: normocephalic atraumatic , Eyes: PERRL EOM's full, conjunctiva clear, Nares: paten,t no deformity discharge or tenderness., Ears: no deformity EAC's clear TMs with normal landmarks. Mouth: clear OP, no lesions, edema.  Moist mucous membranes. Dentition in adequate repair. ?NECK: supple without masses, thyromegaly or bruits. ?CHEST/PULM:  Clear to auscultation and percussion breath sounds equal no wheeze , rales or rhonchi. No chest wall deformities or tenderness. ?Breast: normal by inspection . No dimpling, discharge, masses, tenderness or discharge . ?CV: PMI is nondisplaced, S1  S2 no gallops, murmurs, rubs. Peripheral pulses are full without delay.No JVD .  ?ABDOMEN: Bowel sounds normal nontender  No guard or rebound, no hepato splenomegal no CVA tenderness.  No hernia. ?Extremtities:  No

## 2021-07-26 ENCOUNTER — Ambulatory Visit (INDEPENDENT_AMBULATORY_CARE_PROVIDER_SITE_OTHER): Payer: 59 | Admitting: Internal Medicine

## 2021-07-26 ENCOUNTER — Encounter: Payer: Self-pay | Admitting: Internal Medicine

## 2021-07-26 VITALS — BP 110/70 | HR 60 | Temp 98.8°F | Ht 66.0 in | Wt 208.4 lb

## 2021-07-26 DIAGNOSIS — J309 Allergic rhinitis, unspecified: Secondary | ICD-10-CM

## 2021-07-26 DIAGNOSIS — Z0000 Encounter for general adult medical examination without abnormal findings: Secondary | ICD-10-CM

## 2021-07-26 DIAGNOSIS — R7989 Other specified abnormal findings of blood chemistry: Secondary | ICD-10-CM

## 2021-07-26 DIAGNOSIS — E785 Hyperlipidemia, unspecified: Secondary | ICD-10-CM

## 2021-07-26 DIAGNOSIS — R739 Hyperglycemia, unspecified: Secondary | ICD-10-CM

## 2021-07-26 DIAGNOSIS — R7303 Prediabetes: Secondary | ICD-10-CM

## 2021-07-26 DIAGNOSIS — I10 Essential (primary) hypertension: Secondary | ICD-10-CM

## 2021-07-26 DIAGNOSIS — Z79899 Other long term (current) drug therapy: Secondary | ICD-10-CM | POA: Diagnosis not present

## 2021-07-26 LAB — CBC WITH DIFFERENTIAL/PLATELET
Basophils Absolute: 0 10*3/uL (ref 0.0–0.1)
Basophils Relative: 0.8 % (ref 0.0–3.0)
Eosinophils Absolute: 0.1 10*3/uL (ref 0.0–0.7)
Eosinophils Relative: 2.4 % (ref 0.0–5.0)
HCT: 42.9 % (ref 36.0–46.0)
Hemoglobin: 14 g/dL (ref 12.0–15.0)
Lymphocytes Relative: 38.8 % (ref 12.0–46.0)
Lymphs Abs: 1.8 10*3/uL (ref 0.7–4.0)
MCHC: 32.6 g/dL (ref 30.0–36.0)
MCV: 88 fl (ref 78.0–100.0)
Monocytes Absolute: 0.4 10*3/uL (ref 0.1–1.0)
Monocytes Relative: 7.7 % (ref 3.0–12.0)
Neutro Abs: 2.4 10*3/uL (ref 1.4–7.7)
Neutrophils Relative %: 50.3 % (ref 43.0–77.0)
Platelets: 348 10*3/uL (ref 150.0–400.0)
RBC: 4.87 Mil/uL (ref 3.87–5.11)
RDW: 14.3 % (ref 11.5–15.5)
WBC: 4.7 10*3/uL (ref 4.0–10.5)

## 2021-07-26 LAB — HEPATIC FUNCTION PANEL
ALT: 20 U/L (ref 0–35)
AST: 24 U/L (ref 0–37)
Albumin: 4.6 g/dL (ref 3.5–5.2)
Alkaline Phosphatase: 63 U/L (ref 39–117)
Bilirubin, Direct: 0 mg/dL (ref 0.0–0.3)
Total Bilirubin: 0.5 mg/dL (ref 0.2–1.2)
Total Protein: 8.4 g/dL — ABNORMAL HIGH (ref 6.0–8.3)

## 2021-07-26 LAB — HEMOGLOBIN A1C: Hgb A1c MFr Bld: 5.7 % (ref 4.6–6.5)

## 2021-07-26 LAB — BASIC METABOLIC PANEL
BUN: 18 mg/dL (ref 6–23)
CO2: 31 mEq/L (ref 19–32)
Calcium: 10.2 mg/dL (ref 8.4–10.5)
Chloride: 102 mEq/L (ref 96–112)
Creatinine, Ser: 1.11 mg/dL (ref 0.40–1.20)
GFR: 55.04 mL/min — ABNORMAL LOW (ref >60.00)
Glucose, Bld: 89 mg/dL (ref 70–99)
Potassium: 4.6 mEq/L (ref 3.5–5.1)
Sodium: 139 mEq/L (ref 135–145)

## 2021-07-26 LAB — LIPID PANEL
Cholesterol: 287 mg/dL — ABNORMAL HIGH (ref 0–200)
HDL: 67.5 mg/dL (ref >39.00)
LDL Cholesterol: 204 mg/dL — ABNORMAL HIGH (ref 0–99)
NonHDL: 219.65
Total CHOL/HDL Ratio: 4
Triglycerides: 80 mg/dL (ref 0.0–149.0)
VLDL: 16 mg/dL (ref 0.0–40.0)

## 2021-07-26 LAB — TSH: TSH: 0.95 u[IU]/mL (ref 0.35–5.50)

## 2021-07-26 MED ORDER — FLUTICASONE PROPIONATE 50 MCG/ACT NA SUSP: NASAL | 11 refills | Status: AC

## 2021-07-26 MED ORDER — EMPAGLIFLOZIN 10 MG PO TABS: 10.0000 mg | ORAL_TABLET | Freq: Every day | ORAL | 1 refills | Status: DC

## 2021-07-26 MED ORDER — ASPIRIN 81 MG PO TBEC: 81.0000 mg | DELAYED_RELEASE_TABLET | Freq: Every day | ORAL | 2 refills | Status: AC

## 2021-07-26 MED ORDER — CETIRIZINE HCL 10 MG PO TABS: 10.0000 mg | ORAL_TABLET | Freq: Every day | ORAL | 3 refills | Status: DC

## 2021-07-26 MED ORDER — TELMISARTAN-HCTZ 80-12.5 MG PO TABS: 1.0000 | ORAL_TABLET | Freq: Every day | ORAL | 3 refills | Status: DC

## 2021-07-26 NOTE — Patient Instructions (Signed)
Good to see you today. ?Will refill meds as planned .  ? ?Get the colonoscopy as planned  ? ?Will plan follow up depending  on results  or 4-6 months  ? ?

## 2021-07-27 LAB — MICROALBUMIN / CREATININE URINE RATIO
Creatinine,U: 55 mg/dL
Microalb Creat Ratio: 1.3 mg/g (ref 0.0–30.0)
Microalb, Ur: <0.7 mg/dL (ref 0.0–1.9)

## 2021-07-29 ENCOUNTER — Other Ambulatory Visit: Payer: Self-pay

## 2021-07-29 DIAGNOSIS — R7303 Prediabetes: Secondary | ICD-10-CM

## 2021-07-29 NOTE — Progress Notes (Signed)
Cholesterol is way up ( off the atorvastatin )  blood sugar is in controlled range  Take the atorvastatin  daily  .  ?Order Crystal Townsend fasting lipid panel  and hg a1c    in 4 months  on the atorvastatin and then fu visit  to discuss results

## 2021-09-15 ENCOUNTER — Telehealth: Payer: Self-pay | Admitting: Internal Medicine

## 2021-09-15 ENCOUNTER — Telehealth: Payer: Self-pay

## 2021-09-15 MED ORDER — TELMISARTAN-HCTZ 80-12.5 MG PO TABS: 1.0000 | ORAL_TABLET | Freq: Every day | ORAL | 3 refills | Status: DC

## 2021-09-15 NOTE — Telephone Encounter (Signed)
Rx sent to the pharmacy.

## 2021-09-15 NOTE — Telephone Encounter (Signed)
Pt called and the medication  telmisartan-hydrochlorothiazide (MICARDIS HCT) 80-12.5 MG tablet 90 tablet   needs to go to  [facility] ([place], [place] Phone:  [phone]  Fax:  [phone]    Not [facility]

## 2021-09-15 NOTE — Telephone Encounter (Signed)
Patient called because she would like an update on the approval for the telmisartan-hydrochlorothiazide (MICARDIS HCT) 80-12.5 MG tablet  Patient states that she spoke with the pharmacy and they still have not received approval despite form being sent over for it over three weeks ago. She is in need over her medication and would like a call before 4    Good callback number for patient is       Please advise

## 2021-09-16 NOTE — Telephone Encounter (Signed)
Rx was sent to pharmacy. 

## 2021-10-01 NOTE — Telephone Encounter (Signed)
PA was resubmitted .

## 2021-10-01 NOTE — Telephone Encounter (Signed)
Robin with [facility] is calling and telmisartan-hydrochlorothiazide (MICARDIS HCT) 80-12.5 MG tablet needs PA please call [phone] and fax number is [phone]

## 2021-10-08 ENCOUNTER — Telehealth: Payer: Self-pay | Admitting: Internal Medicine

## 2021-10-08 MED ORDER — TELMISARTAN-HCTZ 80-12.5 MG PO TABS: 1.0000 | ORAL_TABLET | Freq: Every day | ORAL | 3 refills | Status: DC

## 2021-10-08 NOTE — Telephone Encounter (Signed)
Spoke with rep from [facility] [employer] [employer] and was told forms would be faxed to be filled out to complete PA request.

## 2021-10-08 NOTE — Addendum Note (Signed)
Addended by: Geradine Girt D on: 10/08/2021 01:49 PM   Modules accepted: Orders

## 2021-10-08 NOTE — Telephone Encounter (Signed)
Spoke with Maltada from [employer] and she stated that Rx telmisartan-hydrochlorothiazide (MICARDIS HCT) 80-12.5 MG tablet Was approved for 1 year and that she is calling Rx into the pharmacy and will give patient a call.

## 2021-10-08 NOTE — Telephone Encounter (Signed)
Spoke with Shirlean Mylar from [employer] to get more information on why PA has not been approved addition info was needed and so rep faxed paperwork that was filled out and faxed back.  Shirlean Mylar stated call back in an hour for update [phone]

## 2021-10-08 NOTE — Telephone Encounter (Signed)
[facility] needs info for prior authorization of telmisartan-hydrochlorothiazide (MICARDIS HCT) 80-12.5 MG tablet   fax [phone]  Pt is very frustrated that this has not been resolved   Ref # [id number]

## 2021-10-27 ENCOUNTER — Encounter: Payer: 59 | Admitting: Gastroenterology

## 2022-01-13 ENCOUNTER — Encounter: Payer: Self-pay | Admitting: Physician Assistant

## 2022-01-18 NOTE — Progress Notes (Unsigned)
No chief complaint on file.   HPI: Crystal Townsend 56 y.o. come in for  ROS: See pertinent positives and negatives per HPI.  Past Medical History:  Diagnosis Date   Allergic rhinitis    Allergy    Anxiety    Chest pain    Hx, no problems as of 08/10/15   Depression    Diabetes mellitus    Type II   Dysmenorrhea    Fatigue    Hx, no current problems as of 08/10/15   Fibroids    GERD (gastroesophageal reflux disease)    diet controlled, no meds   Hyperlipidemia    Hypertension    IBS (irritable bowel syndrome)    no meds, diet controlled   Impaired fasting glucose    Knee pain, bilateral    Hx, no current problems as of 08/10/15   Leukoplakia oral mucosa    2011   Menorrhagia    OSA (obstructive sleep apnea)    does not use cpap, never went to get machine   Other dysfunctions of sleep stages or arousal from sleep    Sleep apnea    Does not use CPAP   Snoring    had a sleep study mild osa 2009    Family History  Problem Relation Age of Onset   Lung cancer Mother    Cancer Mother        lung    Prostate cancer Father    Cancer Father        prostate   Diabetes Father    Hyperlipidemia Father    Hypertension Father    Asthma Father    Cancer Maternal Aunt        breast   Cancer Maternal Aunt        breast   Cancer Maternal Aunt        breast   Colon polyps Neg Hx    Esophageal cancer Neg Hx    Rectal cancer Neg Hx    Stomach cancer Neg Hx     Social History   Socioeconomic History   Marital status: Single    Spouse name: Not on file   Number of children: Not on file   Years of education: Not on file   Highest education level: Not on file  Occupational History   Not on file  Tobacco Use   Smoking status: Never   Smokeless tobacco: Never  Substance and Sexual Activity   Alcohol use: Yes    Comment: social wine   Drug use: No   Sexual activity: Not Currently    Birth control/protection: I.U.D.    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 100 tablet 2   aspirin 81 MG EC tablet Take 1 tablet (81  mg total) by mouth daily. Swallow whole. 100 tablet 2   atorvastatin (LIPITOR) 20 MG tablet TAKE 1 TABLET BY MOUTH ONCE DAILY 30 tablet 0   Biotin 1000 MCG tablet Take 2,000 mcg by mouth daily.      cetirizine (ZYRTEC) 10 MG tablet Take 1 tablet (10 mg total) by mouth daily. 90 tablet 3   cyclobenzaprine (FLEXERIL) 10 MG tablet cyclobenzaprine 10 mg tablet  TAKE 1 TABLET BY MOUTH THREE TIMES DAILY (Patient not taking: Reported on 07/26/2021)     empagliflozin (JARDIANCE) 10 MG TABS tablet Take 1 tablet (10 mg total) by mouth daily. 90 tablet 1   fluticasone (FLONASE) 50 MCG/ACT nasal spray 2 spray each nostril qd 16 g 11   ibuprofen (ADVIL) 800 MG tablet Take 800 mg by mouth every 8 (eight) hours as needed for moderate pain.     Multiple Vitamin (MULTI-VITAMIN PO) Take by mouth.     omeprazole (PRILOSEC) 20 MG capsule Take 1 capsule (20 mg total) by mouth daily. (Patient not taking: Reported on 07/26/2021) 30 capsule 1   OPTIVE SENSITIVE 0.5-0.9 % SOLN Place 1 drop into both eyes 3 (three) times daily.   11   telmisartan-hydrochlorothiazide (MICARDIS HCT) 80-12.5 MG tablet Take 1 tablet by mouth daily. 90 tablet 3   VITAMIN D PO Take 1 capsule by mouth daily.     No facility-administered  medications prior to visit.     EXAM:  There were no vitals taken for this visit.  There is no height or weight on file to calculate BMI.  GENERAL: vitals reviewed and listed above, alert, oriented, appears well hydrated and in no acute distress HEENT: atraumatic, conjunctiva  clear, no obvious abnormalities on inspection of external nose and ears OP : no lesion edema or exudate  NECK: no obvious masses on inspection palpation  LUNGS: clear to auscultation bilaterally, no wheezes, rales or rhonchi, good air movement CV: HRRR, no clubbing cyanosis or  peripheral edema nl cap refill  MS: moves all extremities without noticeable focal  abnormality PSYCH: pleasant and cooperative, no obvious depression or anxiety Lab Results  Component Value Date   WBC 4.7 07/26/2021   HGB 14.0 07/26/2021   HCT 42.9 07/26/2021   PLT 348.0 07/26/2021   GLUCOSE 89 07/26/2021   CHOL 287 (H) 07/26/2021   TRIG 80.0 07/26/2021   HDL 67.50 07/26/2021   LDLDIRECT 162.5 03/07/2013   LDLCALC 204 (H) 07/26/2021   ALT 20 07/26/2021   AST 24 07/26/2021   NA 139 07/26/2021   K 4.6 07/26/2021   CL 102 07/26/2021   CREATININE 1.11 07/26/2021   BUN 18 07/26/2021   CO2 31 07/26/2021   TSH 0.95 07/26/2021   HGBA1C 5.7 07/26/2021   MICROALBUR <0.7 07/26/2021   BP Readings from Last 3 Encounters:  07/26/21 110/70  02/17/20 118/66  07/03/19 140/90    ASSESSMENT AND PLAN:  Discussed the following assessment and plan:  No diagnosis found.  -Patient advised to return or notify health care team  if  new concerns arise.  There are no Patient Instructions on file for this visit.   Standley Brooking. Layce Sprung M.D.

## 2022-01-19 ENCOUNTER — Ambulatory Visit (INDEPENDENT_AMBULATORY_CARE_PROVIDER_SITE_OTHER): Payer: 59 | Admitting: Internal Medicine

## 2022-01-19 ENCOUNTER — Encounter: Payer: Self-pay | Admitting: Internal Medicine

## 2022-01-19 VITALS — BP 114/80 | HR 70 | Temp 98.1°F | Wt 234.2 lb

## 2022-01-19 DIAGNOSIS — R109 Unspecified abdominal pain: Secondary | ICD-10-CM | POA: Diagnosis not present

## 2022-01-19 DIAGNOSIS — R102 Pelvic and perineal pain: Secondary | ICD-10-CM | POA: Diagnosis not present

## 2022-01-19 NOTE — Patient Instructions (Addendum)
I agree with Gi evaluation  and possible colonoscopy .  I will send a communication to the tGI team and see if they can see you  earlier than appt on 11 14 .

## 2022-01-20 NOTE — Progress Notes (Signed)
Pt scheduled to see Vicie Mutters PA 02/04/22 at 1:30pm. Pt aware of appt.

## 2022-01-27 LAB — HM DIABETES EYE EXAM

## 2022-02-01 ENCOUNTER — Ambulatory Visit: Payer: 59 | Admitting: Internal Medicine

## 2022-02-03 NOTE — Progress Notes (Signed)
02/04/2022 Francile Demeritous Scheirer [id number] 1965-09-23  Referring provider: Burnis Medin, MD Primary GI doctor: Dr. Fuller Plan  ASSESSMENT AND PLAN:   Esophageal dysphagia worsening over the the past year with reflux Dysphagia to meats primarily. Has been off her PPI and when discussing restarting she would like to not be on a medication if she does not have to. Lifestyle changes discussed, avoid NSAIDS, ETOH Weight loss discussed with patient in detail. We will proceed with EGD with possible dilatation to evaluate for stenosis, esophagitis, gastritis, H. pylori. I discussed risks of EGD with patient today, including risk of sedation, bleeding or perforation.  Patient provides understanding and gave verbal consent to proceed.  Chronic idiopathic constipation with fecal incontinence Patient is due for colonoscopy, will plan for 2-day prep with history of constipation and schedule for colonoscopy. Patient again declines medications, one of her squatty potty, increasing fiber, water Possible component of prolapse/pelvic floor dysfunction, if colonoscopy unremarkable suggest possible Linzess and pelvic floor physical therapy versus anal rectal manometry  History of colonic polyps 2007 3 mm sigmoid polyp unremarkable recall 2017 unable to make it.  History of Present Illness:  56 y.o. female  with a past medical history of hypertension, OSA, type 2 diabetes, GERD and others listed below, returns to clinic today for evaluation of abdominal pain  2007 colonoscopy Dr. Fuller Plan for screening purposes 3 mm sigmoid polyp, otherwise unremarkable Patient was scheduled 2017 for colonoscopy with Dr. Fuller Plan however this was canceled due to patient being unable to find transportation.  She states she has been having some lower AB/pelvic pain, went to GYN first and had pelvic US that was unremarkable.  She was given dicyclomine by PCP, pain has improved.  She continues to have other issues.  She can  have BM and feels incomplete, she will feel round ball stuck in rectum that will come out with wiping.  No hematochezia.  She has BM depending on diet, she has BM every other day or every 2 days. This is normal for her. Sometimes straining but normally she is not straining, very soft stools.  She has been having some fecal incontinence. Will be walking and have fecal ball in her underwear with lack of awareness.   Duration for about a year, will happen 3 x a month, can be soft but mostly can be a small ball of feces.  No fecal urgency.   No nocturnal episodes. No recent change in stool consistency.   No history of prior hemorrhoid, fissure or anorectal surgery, pelvic irradiation, or neurologic disease. Has some vaginal heaviness, feeling of something protruding, has lower back pain.  Denies dyspareunia, will have are overflow incontinence if she holds it for too long.  1 birth, C section, he was 10 lb and 11 oz.   States if she has baked chicken or meats can feel it get stuck mid esophagus and will not be able to eat or drink after that.  She feels like she wants to force herself to vomit but she doesn't do that, she just doesn't eat and it helps.  Most recently she was eating fish and had the same issue with fish which is unusual for her. Can have some pain with it.  She off prilosec for 3 years, thought she has not needed it.   She  reports that she has never smoked. She has never used smokeless tobacco. She reports current alcohol use. She reports that she does not use drugs. Her family history includes  Asthma in her father; Cancer in her father, maternal aunt, maternal aunt, maternal aunt, and mother; Diabetes in her father; Hyperlipidemia in her father; Hypertension in her father; Lung cancer in her mother; Prostate cancer in her father.   Current Medications:   Current Outpatient Medications (Endocrine & Metabolic):    empagliflozin (JARDIANCE) 10 MG TABS tablet, Take 1 tablet (10 mg  total) by mouth daily.  Current Outpatient Medications (Cardiovascular):    atorvastatin (LIPITOR) 20 MG tablet, TAKE 1 TABLET BY MOUTH ONCE DAILY   telmisartan-hydrochlorothiazide (MICARDIS HCT) 80-12.5 MG tablet, Take 1 tablet by mouth daily.  Current Outpatient Medications (Respiratory):    cetirizine (ZYRTEC) 10 MG tablet, Take 1 tablet (10 mg total) by mouth daily.   fluticasone (FLONASE) 50 MCG/ACT nasal spray, 2 spray each nostril qd  Current Outpatient Medications (Analgesics):    acetaminophen (TYLENOL) 325 MG tablet, Take 2 tablets (650 mg total) by mouth every 8 (eight) hours as needed for pain (headache).   aspirin 81 MG EC tablet, Take 1 tablet (81 mg total) by mouth daily. Swallow whole.   ibuprofen (ADVIL) 800 MG tablet, Take 800 mg by mouth every 8 (eight) hours as needed for moderate pain.   Current Outpatient Medications (Other):    Biotin 1000 MCG tablet, Take 2,000 mcg by mouth daily.    Multiple Vitamin (MULTI-VITAMIN PO), Take by mouth.   OPTIVE SENSITIVE 0.5-0.9 % SOLN, Place 1 drop into both eyes 3 (three) times daily.    VITAMIN D PO, Take 1 capsule by mouth daily.   dicyclomine (BENTYL) 10 MG capsule, Take 10 mg by mouth 3 (three) times daily.   omeprazole (PRILOSEC) 20 MG capsule, Take 1 capsule (20 mg total) by mouth daily.  Surgical History:  She  has a past surgical history that includes Cesarean section (1997); Ectopic pregnancy surgery (2000); oral biopsy; Dilation and curettage of uterus; Breast lumpectomy with radioactive seed localization (Right, 03/05/2015); and Colonoscopy (2007).  Current Medications, Allergies, Past Medical History, Past Surgical History, Family History and Social History were reviewed in [employer] record.  Physical Exam: BP 100/74   Pulse 72   Ht '5\' 5"'$  (1.651 m)   Wt 210 lb (95.3 kg)   BMI 34.95 kg/m  General:   Pleasant, well developed female in no acute distress Heart : Regular rate and rhythm; no  murmurs Pulm: Clear anteriorly; no wheezing Abdomen:  Soft, Obese AB, Sluggish bowel sounds. mild tenderness in the epigastrium and in the lower abdomen. Without guarding and Without rebound, No organomegaly appreciated. Rectal: Not evaluated Extremities:  without  edema. Neurologic:  Alert and  oriented x4;  No focal deficits.  Psych:  Cooperative. Normal mood and affect.   Vladimir Crofts, PA-C 02/04/22

## 2022-02-04 ENCOUNTER — Encounter: Payer: Self-pay | Admitting: Physician Assistant

## 2022-02-04 ENCOUNTER — Ambulatory Visit: Payer: 59 | Admitting: Physician Assistant

## 2022-02-04 VITALS — BP 100/74 | HR 72 | Ht 65.0 in | Wt 210.0 lb

## 2022-02-04 DIAGNOSIS — R1319 Other dysphagia: Secondary | ICD-10-CM

## 2022-02-04 DIAGNOSIS — R150 Incomplete defecation: Secondary | ICD-10-CM | POA: Diagnosis not present

## 2022-02-04 DIAGNOSIS — K219 Gastro-esophageal reflux disease without esophagitis: Secondary | ICD-10-CM | POA: Diagnosis not present

## 2022-02-04 DIAGNOSIS — K5904 Chronic idiopathic constipation: Secondary | ICD-10-CM

## 2022-02-04 DIAGNOSIS — Z86010 Personal history of colonic polyps: Secondary | ICD-10-CM

## 2022-02-04 MED ORDER — NA SULFATE-K SULFATE-MG SULF 17.5-3.13-1.6 GM/177ML PO SOLN: 1.0000 | Freq: Once | ORAL | 0 refills | Status: AC

## 2022-02-04 NOTE — Patient Instructions (Addendum)
We have sent the following medications to your pharmacy for you to pick up at your convenience: [facility] have been scheduled for an endoscopy and colonoscopy. Please follow the written instructions given to you at your visit today. Please pick up your prep supplies at the pharmacy within the next 1-3 days. If you use inhalers (even only as needed), please bring them with you on the day of your procedure.    Miralax is an osmotic laxative.  It only brings more water into the stool.  This is safe to take daily.  Can take up to 17 gram of miralax twice a day.  Mix with juice or coffee.  Start 1 capful at night for 3-4 days and reassess your response in 3-4 days.  You can increase and decrease the dose based on your response.  Remember, it can take up to 3-4 days to take effect OR for the effects to wear off.   I often pair this with benefiber in the morning to help assure the stool is not too loose.   May be pelvic floor dysfunction.   Toileting tips to help with your constipation - Drink at least 64-80 ounces of water/liquid per day. - Establish a time to try to move your bowels every day.  For many people, this is after a cup of coffee or after a meal such as breakfast. - Sit all of the way back on the toilet keeping your back fairly straight and while sitting up, try to rest the tops of your forearms on your upper thighs.   - Raising your feet with a step stool/squatty potty can be helpful to improve the angle that allows your stool to pass through the rectum. - Relax the rectum feeling it bulge toward the toilet water.  If you feel your rectum raising toward your body, you are contracting rather than relaxing. - Breathe in and slowly exhale. "Belly breath" by expanding your belly towards your belly button. Keep belly expanded as you gently direct pressure down and back to the anus.  A low pitched GRRR sound can assist with increasing intra-abdominal pressure.  - Repeat 3-4 times. If  unsuccessful, contract the pelvic floor to restore normal tone and get off the toilet.  Avoid excessive straining. - To reduce excessive wiping by teaching your anus to normally contract, place hands on outer aspect of knees and resist knee movement outward.  Hold 5-10 second then place hands just inside of knees and resist inward movement of knees.  Hold 5 seconds.  Repeat a few times each way.  Dysphagia precautions:  2. Begin meals with warm beverage 3. Eat smaller more frequent meals 4. Eat slowly, taking small bites and sips 5. Alternate solids and liquids 6. Avoid foods/liquids that increase acid production 7. Sit upright during and for 30+ minutes after meals to facilitate esophageal clearing 8. All meats should be chopped finely.   If something gets hung in your esophagus and will not come up or go down, proceed to the emergency room.

## 2022-02-15 ENCOUNTER — Ambulatory Visit: Payer: 59 | Admitting: Physician Assistant

## 2022-03-08 ENCOUNTER — Encounter: Payer: Self-pay | Admitting: Gastroenterology

## 2022-03-16 ENCOUNTER — Ambulatory Visit (AMBULATORY_SURGERY_CENTER): Payer: 59 | Admitting: Gastroenterology

## 2022-03-16 ENCOUNTER — Encounter: Payer: Self-pay | Admitting: Gastroenterology

## 2022-03-16 VITALS — BP 104/52 | HR 61 | Temp 97.8°F | Resp 11 | Ht 65.0 in | Wt 210.0 lb

## 2022-03-16 DIAGNOSIS — D122 Benign neoplasm of ascending colon: Secondary | ICD-10-CM

## 2022-03-16 DIAGNOSIS — D125 Benign neoplasm of sigmoid colon: Secondary | ICD-10-CM | POA: Diagnosis not present

## 2022-03-16 DIAGNOSIS — D123 Benign neoplasm of transverse colon: Secondary | ICD-10-CM

## 2022-03-16 DIAGNOSIS — K2289 Other specified disease of esophagus: Secondary | ICD-10-CM | POA: Diagnosis not present

## 2022-03-16 DIAGNOSIS — D124 Benign neoplasm of descending colon: Secondary | ICD-10-CM

## 2022-03-16 DIAGNOSIS — K222 Esophageal obstruction: Secondary | ICD-10-CM

## 2022-03-16 DIAGNOSIS — Z1212 Encounter for screening for malignant neoplasm of rectum: Secondary | ICD-10-CM | POA: Diagnosis not present

## 2022-03-16 DIAGNOSIS — K31819 Angiodysplasia of stomach and duodenum without bleeding: Secondary | ICD-10-CM

## 2022-03-16 DIAGNOSIS — K449 Diaphragmatic hernia without obstruction or gangrene: Secondary | ICD-10-CM | POA: Diagnosis not present

## 2022-03-16 DIAGNOSIS — Z1211 Encounter for screening for malignant neoplasm of colon: Secondary | ICD-10-CM

## 2022-03-16 DIAGNOSIS — K635 Polyp of colon: Secondary | ICD-10-CM | POA: Diagnosis not present

## 2022-03-16 DIAGNOSIS — K209 Esophagitis, unspecified without bleeding: Secondary | ICD-10-CM

## 2022-03-16 DIAGNOSIS — R1319 Other dysphagia: Secondary | ICD-10-CM | POA: Diagnosis not present

## 2022-03-16 MED ORDER — SODIUM CHLORIDE 0.9 % IV SOLN: 500.0000 mL | Freq: Once | INTRAVENOUS | Status: DC

## 2022-03-16 MED ORDER — OMEPRAZOLE 40 MG PO CPDR: 40.0000 mg | DELAYED_RELEASE_CAPSULE | Freq: Every day | ORAL | 3 refills | Status: AC

## 2022-03-16 NOTE — Progress Notes (Signed)
Pt's states no medical or surgical changes since previsit or office visit. 

## 2022-03-16 NOTE — Progress Notes (Signed)
History & Physical  Primary Care Physician:  Burnis Medin, MD Primary Gastroenterologist: Lucio Edward, MD  CHIEF COMPLAINT:  CRC screening, Dysphagia   HPI: Crystal Townsend is a 56 y.o. female average risk CRC screening, constipation and dysphagia for colonoscopy and EGD.   Past Medical History:  Diagnosis Date   Allergic rhinitis    Allergy    Anxiety    Chest pain    Hx, no problems as of 08/10/15   Depression    Diabetes mellitus    Type II   Dysmenorrhea    Fatigue    Hx, no current problems as of 08/10/15   Fibroids    GERD (gastroesophageal reflux disease)    diet controlled, no meds   Hyperlipidemia    Hypertension    IBS (irritable bowel syndrome)    no meds, diet controlled   Impaired fasting glucose    Knee pain, bilateral    Hx, no current problems as of 08/10/15   Leukoplakia oral mucosa    2011   Menorrhagia    OSA (obstructive sleep apnea)    does not use cpap, never went to get machine   Other dysfunctions of sleep stages or arousal from sleep    Sleep apnea    Does not use CPAP   Snoring    had a sleep study mild osa 2009    Past Surgical History:  Procedure Laterality Date   BREAST LUMPECTOMY WITH RADIOACTIVE SEED LOCALIZATION Right 03/05/2015   Procedure: RIGHT BREAST LUMPECTOMY WITH RADIOACTIVE SEED LOCALIZATION;  Surgeon: Erroll Luna, MD;  Location: [facility];  Service: General;  Laterality: Right;   [place]   COLONOSCOPY  2007   Hargis Vandyne - polyp   DILATION AND CURETTAGE OF UTERUS     ECTOPIC PREGNANCY SURGERY  2000   laparoscopy   oral biopsy      Prior to Admission medications   Medication Sig Start Date End Date Taking? Authorizing Provider  aspirin 81 MG EC tablet Take 1 tablet (81 mg total) by mouth daily. Swallow whole. 07/26/21  Yes Panosh, Standley Brooking, MD  atorvastatin (LIPITOR) 20 MG tablet TAKE 1 TABLET BY MOUTH ONCE DAILY 03/17/21  Yes Panosh, Standley Brooking, MD  Biotin 1000 MCG tablet Take 2,000  mcg by mouth daily.    Yes [provider]  cetirizine (ZYRTEC) 10 MG tablet Take 1 tablet (10 mg total) by mouth daily. 07/26/21  Yes Panosh, Standley Brooking, MD  empagliflozin (JARDIANCE) 10 MG TABS tablet Take 1 tablet (10 mg total) by mouth daily. 07/26/21  Yes Panosh, Standley Brooking, MD  Multiple Vitamin (MULTI-VITAMIN PO) Take by mouth.   Yes [provider]  OPTIVE SENSITIVE 0.5-0.9 % SOLN Place 1 drop into both eyes 3 (three) times daily.  04/07/17  Yes [provider]  telmisartan-hydrochlorothiazide (MICARDIS HCT) 80-12.5 MG tablet Take 1 tablet by mouth daily. 10/08/21  Yes Panosh, Standley Brooking, MD  VITAMIN D PO Take 1 capsule by mouth daily.   Yes [provider]  acetaminophen (TYLENOL) 325 MG tablet Take 2 tablets (650 mg total) by mouth every 8 (eight) hours as needed for pain (headache). 04/23/12   Panosh, Standley Brooking, MD  dicyclomine (BENTYL) 10 MG capsule Take 10 mg by mouth 3 (three) times daily. 01/13/22   [provider]  fluticasone Asencion Islam) 50 MCG/ACT nasal spray 2 spray each nostril qd 07/26/21   Panosh, Standley Brooking, MD  ibuprofen (ADVIL) 800 MG tablet  Take 800 mg by mouth every 8 (eight) hours as needed for moderate pain.    [provider]  omeprazole (PRILOSEC) 20 MG capsule Take 1 capsule (20 mg total) by mouth daily. 07/28/17   Panosh, Standley Brooking, MD    Current Outpatient Medications  Medication Sig Dispense Refill   aspirin 81 MG EC tablet Take 1 tablet (81 mg total) by mouth daily. Swallow whole. 100 tablet 2   atorvastatin (LIPITOR) 20 MG tablet TAKE 1 TABLET BY MOUTH ONCE DAILY 30 tablet 0   Biotin 1000 MCG tablet Take 2,000 mcg by mouth daily.      cetirizine (ZYRTEC) 10 MG tablet Take 1 tablet (10 mg total) by mouth daily. 90 tablet 3   empagliflozin (JARDIANCE) 10 MG TABS tablet Take 1 tablet (10 mg total) by mouth daily. 90 tablet 1   Multiple Vitamin (MULTI-VITAMIN PO) Take by mouth.     OPTIVE SENSITIVE 0.5-0.9 % SOLN Place 1 drop into both  eyes 3 (three) times daily.   11   telmisartan-hydrochlorothiazide (MICARDIS HCT) 80-12.5 MG tablet Take 1 tablet by mouth daily. 90 tablet 3   VITAMIN D PO Take 1 capsule by mouth daily.     acetaminophen (TYLENOL) 325 MG tablet Take 2 tablets (650 mg total) by mouth every 8 (eight) hours as needed for pain (headache). 100 tablet 2   dicyclomine (BENTYL) 10 MG capsule Take 10 mg by mouth 3 (three) times daily.     fluticasone (FLONASE) 50 MCG/ACT nasal spray 2 spray each nostril qd 16 g 11   ibuprofen (ADVIL) 800 MG tablet Take 800 mg by mouth every 8 (eight) hours as needed for moderate pain.     omeprazole (PRILOSEC) 20 MG capsule Take 1 capsule (20 mg total) by mouth daily. 30 capsule 1   Current Facility-Administered Medications  Medication Dose Route Frequency Provider Last Rate Last Admin   0.9 %  sodium chloride infusion  500 mL Intravenous Once Ladene Artist, MD        Allergies as of 03/16/2022 - Review Complete 03/16/2022  Allergen Reaction Noted   Lisinopril  02/11/2008   Metformin  12/28/2009    Family History  Problem Relation Age of Onset   Lung cancer Mother    Cancer Mother        lung    Prostate cancer Father    Cancer Father        prostate   Diabetes Father    Hyperlipidemia Father    Hypertension Father    Asthma Father    Cancer Maternal Aunt        breast   Cancer Maternal Aunt        breast   Cancer Maternal Aunt        breast   Colon polyps Neg Hx    Esophageal cancer Neg Hx    Rectal cancer Neg Hx    Stomach cancer Neg Hx     Social History   Socioeconomic History   Marital status: Single    Spouse name: Not on file   Number of children: Not on file   Years of education: Not on file   Highest education level: Not on file  Occupational History   Not on file  Tobacco Use   Smoking status: Never   Smokeless tobacco: Never  Substance and Sexual Activity   Alcohol use: Yes    Comment: social wine   Drug use: No   Sexual activity:  Not  Currently    Birth control/protection: I.U.D.    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   Physical Exam: General:  Alert, well-developed, in NAD Head:  Normocephalic and atraumatic. Eyes:  Sclera clear, no icterus.   Conjunctiva pink. Ears:  Normal auditory acuity. Mouth:  No deformity or lesions.  Neck:  Supple; no masses . Lungs:  Clear throughout to auscultation.   No wheezes, crackles, or rhonchi. No acute distress. Heart:  Regular rate and rhythm; no murmurs. Abdomen:  Soft, nondistended, nontender. No masses, hepatomegaly. No obvious masses.  Normal bowel .    Rectal:  Deferred   Msk:  Symmetrical without gross deformities.. Pulses:  Normal pulses noted. Extremities:  Without edema. Neurologic:  Alert and  oriented x4;  grossly normal neurologically. Skin:  Intact without significant lesions or rashes. Psych:  Alert and cooperative. Normal mood and affect.  Impression / Plan:   Average risk CRC screening, constipation and dysphagia for colonoscopy and EGD.  Pricilla Riffle. Fuller Plan  03/16/2022, 10:33 AM See Shea Evans, [facility], to contact our on call provider

## 2022-03-16 NOTE — Op Note (Signed)
[place] Patient Name: Crystal Townsend Procedure Date: 03/16/2022 10:40 AM MRN: [id number] Endoscopist: Ladene Artist , MD, [id number] Age: 56 Referring MD:  Date of Birth: 1965/08/19 Gender: Female Account #: [id number] Procedure:                Upper GI endoscopy Indications:              Dysphagia Medicines:                Monitored Anesthesia Care Procedure:                Pre-Anesthesia Assessment:                           - Prior to the procedure, a History and Physical                            was performed, and patient medications and                            allergies were reviewed. The patient's tolerance of                            previous anesthesia was also reviewed. The risks                            and benefits of the procedure and the sedation                            options and risks were discussed with the patient.                            All questions were answered, and informed consent                            was obtained. Prior Anticoagulants: The patient has                            taken no anticoagulant or antiplatelet agents. ASA                            Grade Assessment: III - A patient with severe                            systemic disease. After reviewing the risks and                            benefits, the patient was deemed in satisfactory                            condition to undergo the procedure.                           After obtaining informed consent, the endoscope was  passed under direct vision. Throughout the                            procedure, the patient's blood pressure, pulse, and                            oxygen saturations were monitored continuously. The                            Endoscope was introduced through the mouth, and                            advanced to the second part of duodenum. The upper                            GI endoscopy was accomplished without  difficulty.                            The patient tolerated the procedure well. Scope In: Scope Out: Findings:                 One benign-appearing, intrinsic moderate stenosis                            was found 40 cm from the incisors. This stenosis                            measured 1.3 cm (inner diameter) x less than one cm                            (in length). The stenosis was traversed.                           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                            Biopsies were taken with a cold forceps for                            histology. A guidewire was placed and the scope was                            withdrawn. Dilation was performed with a Savary                            dilator with mild resistance at 16 mm.                           LA Grade A (one or more mucosal breaks less than 5                            mm, not extending between tops of 2 mucosal folds)  esophagitis with no bleeding was found at the                            gastroesophageal junction.                           The exam of the esophagus was otherwise normal.                            Biopsies were taken with a cold forceps for                            histology.                           A small hiatal hernia was present.                           The exam of the stomach was otherwise normal.                           A single 3 mm angioectasia without bleeding was                            found in the second portion of the duodenum.                           The exam of the duodenum was otherwise normal. Complications:            No immediate complications. Estimated Blood Loss:     Estimated blood loss was minimal. Impression:               - Benign-appearing esophageal stenosis. Dilated.                           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 Biopsied. Dilated.                           - LA Grade A esophagitis.                            - Otherwise normal esophagus. Biopsied.                           - Small hiatal hernia.                           - Small duodenal angioectasia. Recommendation:           - Patient has a contact number available for                            emergencies. The signs and symptoms of potential                            delayed complications were discussed with the  patient. Return to normal activities tomorrow.                            Written discharge instructions were provided to the                            patient.                           - Clear liquid diet for 2 hours, then advance as                            tolerated to soft diet today.                           - Resume prior diet tomorrow.                           - Follow antireflux measures.                           - Omeprazole 40 mg po qd, 1 year of refills.                           - Continue present medications.                           - Await pathology results.                           - Return to GI office in 2 months. Ladene Artist, MD 03/16/2022 11:22:50 AM This report has been signed electronically.

## 2022-03-16 NOTE — Progress Notes (Signed)
A and O x3. Report to RN. Tolerated MAC anesthesia well.Teeth unchanged after procedure. 

## 2022-03-16 NOTE — Op Note (Signed)
[place] Patient Name: Crystal Townsend Procedure Date: 03/16/2022 10:41 AM MRN: [id number] Endoscopist: Ladene Artist , MD, [id number] Age: 56 Referring MD:  Date of Birth: Aug 10, 1965 Gender: Female Account #: [id number] Procedure:                Colonoscopy Indications:              Screening for colorectal malignant neoplasm Medicines:                Monitored Anesthesia Care Procedure:                Pre-Anesthesia Assessment:                           - Prior to the procedure, a History and Physical                            was performed, and patient medications and                            allergies were reviewed. The patient's tolerance of                            previous anesthesia was also reviewed. The risks                            and benefits of the procedure and the sedation                            options and risks were discussed with the patient.                            All questions were answered, and informed consent                            was obtained. Prior Anticoagulants: The patient has                            taken no anticoagulant or antiplatelet agents. ASA                            Grade Assessment: II - A patient with mild systemic                            disease. After reviewing the risks and benefits,                            the patient was deemed in satisfactory condition to                            undergo the procedure.                           After obtaining informed consent, the colonoscope  was passed under direct vision. Throughout the                            procedure, the patient's blood pressure, pulse, and                            oxygen saturations were monitored continuously. The                            Olympus CF-HQ190L [id number]) Colonoscope was                            introduced through the anus and advanced to the the                            cecum,  identified by appendiceal orifice and                            ileocecal valve. The ileocecal valve, appendiceal                            orifice, and rectum were photographed. The quality                            of the bowel preparation was good. The colonoscopy                            was performed without difficulty. The patient                            tolerated the procedure well. Scope In: 10:45:01 AM Scope Out: 11:02:57 AM Scope Withdrawal Time: 0 hours 15 minutes 39 seconds  Total Procedure Duration: 0 hours 17 minutes 56 seconds  Findings:                 The perianal and digital rectal examinations were                            normal.                           Five sessile polyps were found in the sigmoid colon                            (1), descending colon (2), transverse colon (1) and                            ascending colon (1). The polyps were 6 to 7 mm in                            size. These polyps were removed with a cold snare.                            Resection and retrieval were complete.  Multiple small-mouthed diverticula were found in                            the left colon. There was no evidence of                            diverticular bleeding.                           The exam was otherwise without abnormality on                            direct and retroflexion views. Complications:            No immediate complications. Estimated blood loss:                            None. Estimated Blood Loss:     Estimated blood loss: none. Impression:               - Five 6 to 7 mm polyps in the sigmoid colon, in                            the descending colon, in the transverse colon and                            in the ascending colon, removed with a cold snare.                            Resected and retrieved.                           - Mild diverticulosis in the left colon.                           - The  examination was otherwise normal on direct                            and retroflexion views. Recommendation:           - Repeat colonoscopy after studies are complete for                            surveillance based on pathology results.                           - Patient has a contact number available for                            emergencies. The signs and symptoms of potential                            delayed complications were discussed with the                            patient. Return to normal  activities tomorrow.                            Written discharge instructions were provided to the                            patient.                           - High fiber diet.                           - Continue present medications.                           - Await pathology results. Ladene Artist, MD 03/16/2022 11:06:06 AM This report has been signed electronically.

## 2022-03-16 NOTE — Progress Notes (Signed)
Called to room to assist during endoscopic procedure.  Patient ID and intended procedure confirmed with present staff. Received instructions for my participation in the procedure from the performing physician.  

## 2022-03-16 NOTE — Patient Instructions (Addendum)
Handouts on high fiber diet, diverticulosis, polyps, esophagitis, hiatal hernia, and post-dilation diet given to patient. Await pathology results. Follow post dilation diet today - clear liquids for 2 hours then advance diet as tolerated to soft diet for the remainder of today and normal diet tomorrow, as tolerated! Pick up prescription for omeprazole 40 mg daily from [facility] off of [address] Return to GI office in 2 months for follow-up - call to make this appointment  Repeat colonoscopy for surveillance will be determined based off of pathology results.   YOU HAD AN ENDOSCOPIC PROCEDURE TODAY AT [facility]:   Refer to the procedure report that was given to you for any specific questions about what was found during the examination.  If the procedure report does not answer your questions, please call your gastroenterologist to clarify.  If you requested that your care partner not be given the details of your procedure findings, then the procedure report has been included in a sealed envelope for you to review at your convenience later.  YOU SHOULD EXPECT: Some feelings of bloating in the abdomen. Passage of more gas than usual.  Walking can help get rid of the air that was put into your GI tract during the procedure and reduce the bloating. If you had a lower endoscopy (such as a colonoscopy or flexible sigmoidoscopy) you may notice spotting of blood in your stool or on the toilet paper. If you underwent a bowel prep for your procedure, you may not have a normal bowel movement for a few days.  Please Note:  You might notice some irritation and congestion in your nose or some drainage.  This is from the oxygen used during your procedure.  There is no need for concern and it should clear up in a day or so.  SYMPTOMS TO REPORT IMMEDIATELY:  Following lower endoscopy (colonoscopy or flexible sigmoidoscopy):  Excessive amounts of blood in the stool  Significant  tenderness or worsening of abdominal pains  Swelling of the abdomen that is new, acute  Fever of 100F or higher  Following upper endoscopy (EGD)  Vomiting of blood or coffee ground material  New chest pain or pain under the shoulder blades  Painful or persistently difficult swallowing  New shortness of breath  Fever of 100F or higher  Black, tarry-looking stools  For urgent or emergent issues, a gastroenterologist can be reached at any hour by calling [phone]. Do not use MyChart messaging for urgent concerns.    DIET:  We do recommend a small meal at first, but then you may proceed to your regular diet.  Drink plenty of fluids but you should avoid alcoholic beverages for 24 hours.  ACTIVITY:  You should plan to take it easy for the rest of today and you should NOT DRIVE or use heavy machinery until tomorrow (because of the sedation medicines used during the test).    FOLLOW UP: Our staff will call the number listed on your records the next business day following your procedure.  We will call around 7:15- 8:00 am to check on you and address any questions or concerns that you may have regarding the information given to you following your procedure. If we do not reach you, we will leave a message.     If any biopsies were taken you will be contacted by phone or by letter within the next 1-3 weeks.  Please call us at [phone] if you have not heard about the biopsies  in 3 weeks.    SIGNATURES/CONFIDENTIALITY: You and/or your care partner have signed paperwork which will be entered into your electronic medical record.  These signatures attest to the fact that that the information above on your After Visit Summary has been reviewed and is understood.  Full responsibility of the confidentiality of this discharge information lies with you and/or your care-partner.

## 2022-03-17 ENCOUNTER — Telehealth: Payer: Self-pay | Admitting: *Deleted

## 2022-03-17 NOTE — Telephone Encounter (Signed)
  Follow up Call-     03/16/2022   10:14 AM  Call back number  Post procedure Call Back phone  # [phone]  Permission to leave phone message Yes     Patient questions:  Do you have a fever, pain , or abdominal swelling? No. Pain Score  0 *  Have you tolerated food without any problems? Yes.    Have you been able to return to your normal activities? Yes.    Do you have any questions about your discharge instructions: Diet   No. Medications  No. Follow up visit  No.  Do you have questions or concerns about your Care? No.  Actions: * If pain score is 4 or above: No action needed, pain <4.

## 2022-03-21 NOTE — Telephone Encounter (Signed)
Appointment reminder mailed home.

## 2022-03-31 ENCOUNTER — Other Ambulatory Visit: Payer: Self-pay | Admitting: Family

## 2022-03-31 ENCOUNTER — Telehealth: Payer: Self-pay

## 2022-03-31 MED ORDER — LOSARTAN POTASSIUM-HCTZ 100-12.5 MG PO TABS: 1.0000 | ORAL_TABLET | Freq: Every day | ORAL | 3 refills | Status: DC

## 2022-03-31 NOTE — Telephone Encounter (Signed)
Received faxed request from [employer] that Telmisartan-HCTX 80-12.'5mg'$  (1 daily) will be non-formulary starting 04/04/22 & NO LONGER COVERED.  Covered alternatives are Losartan-HCTZ, Valsartan-HCTZ  Please review & send a new prescription to [facility].  Last CPE 07/26/21

## 2022-04-01 NOTE — Telephone Encounter (Signed)
Spoke with patient informed of below message concerning non covered medication.   Patient stated she had received notification concerning this from her insurance company.    Patient stated that she currently have a 2 to 3 month supply of Telmisartan-HCTX 80 mg and once that prescription is complete and begin Losartan-HCTZ she will contact office to schedule a 2 week nurse visit appointment for blood pressure check.

## 2022-04-05 ENCOUNTER — Encounter: Payer: Self-pay | Admitting: Gastroenterology

## 2022-04-18 ENCOUNTER — Other Ambulatory Visit (HOSPITAL_COMMUNITY): Payer: Self-pay

## 2022-04-29 ENCOUNTER — Telehealth: Payer: Self-pay

## 2022-05-03 ENCOUNTER — Other Ambulatory Visit (HOSPITAL_COMMUNITY): Payer: Self-pay

## 2022-05-03 NOTE — Telephone Encounter (Signed)
Noted  

## 2022-05-03 NOTE — Telephone Encounter (Signed)
Telmisartan-HCTZ 80-'25mg'$  was discontinued on 03/31/22 and changed to Losartan-HCTZ

## 2022-05-23 ENCOUNTER — Ambulatory Visit: Payer: 59 | Admitting: Gastroenterology

## 2022-05-23 ENCOUNTER — Encounter: Payer: Self-pay | Admitting: Gastroenterology

## 2022-05-23 VITALS — BP 106/76 | HR 81 | Ht 65.0 in | Wt 229.0 lb

## 2022-05-23 DIAGNOSIS — Z86010 Personal history of colonic polyps: Secondary | ICD-10-CM

## 2022-05-23 DIAGNOSIS — K219 Gastro-esophageal reflux disease without esophagitis: Secondary | ICD-10-CM

## 2022-05-23 NOTE — Patient Instructions (Signed)
Continue omeprazole 40 mg daily. Please let us know if you are taking omeprazole 20 mg or omeprazole 40 mg.   The [facility] providers would like to encourage you to use [facility] to communicate with providers for non-urgent requests or questions.  Due to long hold times on the telephone, sending your provider a message by [facility] may be a faster and more efficient way to get a response.  Please allow 48 business hours for a response.  Please remember that this is for non-urgent requests.   Thank you for choosing me and [facility].  Pricilla Riffle. Dagoberto Ligas., MD., Mar[facility] Regal

## 2022-05-23 NOTE — Progress Notes (Signed)
    Assessment     GERD, esophageal stricture and web - dilated. Dysphagia resolved  Personal history of colon polyps - sessile serrated suspected  Duodenal angiectasia   Recommendations    Continue omeprazole 40 mg qd and follow antireflux measures Colonoscopy in Dec 2023 REV in 1 year   HPI    This is a 57 year old female returning for follow-up after esophageal stricture and web dilation.  She relates complete relief of dysphagia symptoms.  She has no gastrointestinal complaints today.  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, biopsied, dilated LA grade A esophagitis Small hiatal hernia Small duodenal angioectasia  Colonoscopy Dec 2023: 5 subcentimeter polyps  Mild left colon diverticulosis   Labs / Imaging       Latest Ref Rng & Units 07/26/2021   10:01 AM 02/17/2020    9:40 AM 11/27/2018    9:02 AM  Hepatic Function  Total Protein 6.0 - 8.3 g/dL 8.4  7.5  7.3   Albumin 3.5 - 5.2 g/dL 4.6   4.6   AST 0 - 37 U/L 24  19  18   $ ALT 0 - 35 U/L 20  12  15   $ Alk Phosphatase 39 - 117 U/L 63   68   Total Bilirubin 0.2 - 1.2 mg/dL 0.5  0.5  0.5   Bilirubin, Direct 0.0 - 0.3 mg/dL 0.0  0.1  0.1        Latest Ref Rng & Units 07/26/2021   10:01 AM 02/17/2020    9:40 AM 01/21/2019    1:43 PM  CBC  WBC 4.0 - 10.5 K/uL 4.7  3.4  8.4   Hemoglobin 12.0 - 15.0 g/dL 14.0  12.9  14.0   Hematocrit 36.0 - 46.0 % 42.9  39.4  45.2   Platelets 150.0 - 400.0 K/uL 348.0  349  419     Current Medications, Allergies, Past Medical History, Past Surgical History, Family History and Social History were reviewed in [employer] record.   Physical Exam: General: Well developed, well nourished, no acute distress Head: Normocephalic and atraumatic Eyes: Sclerae anicteric, EOMI Ears: Normal auditory acuity Mouth: No deformities or lesions noted Lungs: Clear throughout to auscultation Heart: Regular  rate and rhythm; No murmurs, rubs or bruits Abdomen: Soft, non tender and non distended. No masses, hepatosplenomegaly or hernias noted. Normal Bowel sounds Rectal: Not done Musculoskeletal: Symmetrical with no gross deformities  Pulses:  Normal pulses noted Extremities: No edema or deformities noted Neurological: Alert oriented x 4, grossly nonfocal Psychological:  Alert and cooperative. Normal mood and affect   Katelyne Galster T. Fuller Plan, MD 05/23/2022, 9:39 AM

## 2022-06-01 ENCOUNTER — Other Ambulatory Visit: Payer: Self-pay | Admitting: Internal Medicine

## 2022-06-08 ENCOUNTER — Encounter: Payer: Self-pay | Admitting: Internal Medicine

## 2022-06-08 ENCOUNTER — Ambulatory Visit (INDEPENDENT_AMBULATORY_CARE_PROVIDER_SITE_OTHER): Payer: 59 | Admitting: Family Medicine

## 2022-06-08 ENCOUNTER — Encounter: Payer: Self-pay | Admitting: Family Medicine

## 2022-06-08 VITALS — BP 120/80 | HR 100 | Temp 98.5°F | Resp 16 | Ht 65.0 in | Wt 227.0 lb

## 2022-06-08 DIAGNOSIS — R059 Cough, unspecified: Secondary | ICD-10-CM

## 2022-06-08 DIAGNOSIS — R519 Headache, unspecified: Secondary | ICD-10-CM | POA: Diagnosis not present

## 2022-06-08 DIAGNOSIS — J069 Acute upper respiratory infection, unspecified: Secondary | ICD-10-CM | POA: Diagnosis not present

## 2022-06-08 LAB — POC COVID19 BINAXNOW: SARS Coronavirus 2 Ag: NEGATIVE

## 2022-06-08 MED ORDER — BENZONATATE 100 MG PO CAPS: 200.0000 mg | ORAL_CAPSULE | Freq: Two times a day (BID) | ORAL | 0 refills | Status: AC | PRN

## 2022-06-08 NOTE — Patient Instructions (Addendum)
A few things to remember from today's visit:  Cough, unspecified type - Plan: POC COVID-19, benzonatate (TESSALON) 100 MG capsule  URI, acute viral infections are self-limited and we treat each symptom depending of severity.  Over the counter medications as decongestants and cold medications usually help, they need to be taken with caution if there is a history of high blood pressure or palpitations. Tylenol (acetaminophen is also present in over the counter cold meds)and/or Ibuprofen also helps with most symptoms (headache, muscle aching, fever,etc). Plenty of fluids. Honey helps with cough. Steam inhalations helps with runny nose, nasal congestion, and may prevent sinus infections. Cough and nasal congestion could last a few days and sometimes weeks. Please follow in not any better in 1-2 weeks or if symptoms get worse.  Flonase nasal spray daily for 10-14 days then as needed.  Do not use My Chart to request refills or for acute issues that need immediate attention. If you send a my chart message, it may take a few days to be addressed, specially if I am not in the office.  Please be sure medication list is accurate. If a new problem present, please set up appointment sooner than planned today.

## 2022-06-08 NOTE — Progress Notes (Signed)
ACUTE VISIT Chief Complaint  Patient presents with   Headache   Chills   Cough    Non productive, started Saturday, fever x a few days.   HPI: [name] is a 57 y.o. female, who is here today complaining of  cough and fever since Saturday. She reports having a fever as recent as yesterday but did not measure the temperature. Cough The current episode started in the past 7 days. The problem has been unchanged. The problem occurs every few hours. Associated symptoms include chills, a fever, headaches, myalgias, nasal congestion, postnasal drip, rhinorrhea and wheezing. Pertinent negatives include no chest pain, ear congestion, ear pain, heartburn, hemoptysis, rash, sore throat, shortness of breath, sweats or weight loss. Nothing aggravates the symptoms. Her past medical history is significant for environmental allergies.   C/O severe frontal headache. No associated visual changes, MS changes,or focal deficit.  She reports experiencing some wheezing. She has a history of wheezing with respiratory viral infection but has not experienced it in several years. She denies any history of asthma or COPD.  She also reports mild nausea but denies any abdominal pain, vomiting, or changes in bowel habits. She has been self-medicating with Mucinex, Aleve, Tylenol, Advil, and Vicks VapoRub. Negative for known sick exposure or recent travel.  Review of Systems  Constitutional:  Positive for chills and fever. Negative for weight loss.  HENT:  Positive for postnasal drip and rhinorrhea. Negative for ear pain and sore throat.   Respiratory:  Positive for cough and wheezing. Negative for hemoptysis and shortness of breath.   Cardiovascular:  Negative for chest pain.  Gastrointestinal:  Negative for heartburn.  Genitourinary:  Negative for decreased urine volume, dysuria and hematuria.  Musculoskeletal:  Positive for myalgias.  Skin:  Negative for rash.  Allergic/Immunologic: Positive for  environmental allergies.  Neurological:  Positive for headaches.  Psychiatric/Behavioral:  Negative for confusion and hallucinations.   See other pertinent positives and negatives in HPI.  Current Outpatient Medications on File Prior to Visit  Medication Sig Dispense Refill   aspirin 81 MG EC tablet Take 1 tablet (81 mg total) by mouth daily. Swallow whole. 100 tablet 2   atorvastatin (LIPITOR) 20 MG tablet TAKE 1 TABLET BY MOUTH ONCE DAILY 30 tablet 0   Biotin 1000 MCG tablet Take 2,000 mcg by mouth daily.      cetirizine (ZYRTEC) 10 MG tablet Take 1 tablet (10 mg total) by mouth daily. 90 tablet 3   empagliflozin (JARDIANCE) 10 MG TABS tablet TAKE 1 TABLET(10 MG) BY MOUTH DAILY. 90 tablet 0   fluticasone (FLONASE) 50 MCG/ACT nasal spray 2 spray each nostril qd 16 g 11   ibuprofen (ADVIL) 800 MG tablet Take 800 mg by mouth every 8 (eight) hours as needed for moderate pain.     Multiple Vitamin (MULTI-VITAMIN PO) Take by mouth.     omeprazole (PRILOSEC) 40 MG capsule Take 1 capsule (40 mg total) by mouth daily. 90 capsule 3   OPTIVE SENSITIVE 0.5-0.9 % SOLN Place 1 drop into both eyes 3 (three) times daily.   11   telmisartan-hydrochlorothiazide (MICARDIS HCT) 80-25 MG tablet Take 1 tablet by mouth daily.     VITAMIN D PO Take 1 capsule by mouth daily.     No current facility-administered medications on file prior to visit.    Past Medical History:  Diagnosis Date   Allergic rhinitis    Allergy    Anxiety    Chest pain  Hx, no problems as of 08/10/15   Depression    Diabetes mellitus    Type II   Dysmenorrhea    Fatigue    Hx, no current problems as of 08/10/15   Fibroids    GERD (gastroesophageal reflux disease)    diet controlled, no meds   Hyperlipidemia    Hypertension    IBS (irritable bowel syndrome)    no meds, diet controlled   Impaired fasting glucose    Knee pain, bilateral    Hx, no current problems as of 08/10/15   Leukoplakia oral mucosa    2011   Menorrhagia     OSA (obstructive sleep apnea)    does not use cpap, never went to get machine   Other dysfunctions of sleep stages or arousal from sleep    Sleep apnea    Does not use CPAP   Snoring    had a sleep study mild osa 2009   Allergies  Allergen Reactions   Lisinopril     REACTION: throat epiglottal? swelling ? from ace  . Dr Constance Holster evaluation.  10.09   Metformin     REACTION: severe nausea with 500 mg  Immediate release    Social History   Socioeconomic History   Marital status: Single    Spouse name: Not on file   Number of children: Not on file   Years of education: Not on file   Highest education level: Not on file  Occupational History   Not on file  Tobacco Use   Smoking status: Never   Smokeless tobacco: Never  Substance and Sexual Activity   Alcohol use: Yes    Comment: social wine   Drug use: No   Sexual activity: Not Currently    Birth control/protection: I.U.D.    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  Physical Exam Vitals and nursing note reviewed.  Constitutional:      General: She is not in acute distress.    Appearance: She is well-developed. She is not ill-appearing.  HENT:     Head: Atraumatic.     Right Ear: Tympanic membrane, ear canal and external ear normal.     Left Ear: Tympanic membrane, ear canal and external ear normal.     Nose: Congestion and rhinorrhea present.      Right Turbinates: Enlarged.     Left Turbinates: Enlarged.     Right Sinus: No maxillary sinus tenderness or frontal sinus tenderness.     Left Sinus: No maxillary sinus tenderness or frontal sinus tenderness.  Eyes:     Conjunctiva/sclera: Conjunctivae normal.  Neck:     Meningeal: Brudzinski's sign and Kernig's sign absent.  Cardiovascular:     Rate and Rhythm: Normal rate and regular rhythm.     Heart sounds:  No murmur heard. Pulmonary:     Effort: Pulmonary effort is normal. No respiratory distress.     Breath sounds: Normal breath sounds. No stridor.  Musculoskeletal:     Cervical back: No edema or erythema. No muscular tenderness.  Lymphadenopathy:     Head:     Right side of head: No submandibular adenopathy.     Left side of head: No submandibular adenopathy.     Cervical: Cervical adenopathy present.     Right cervical: Superficial cervical adenopathy present.  Skin:    General: Skin is warm.     Findings: No erythema or rash.  Neurological:     General: No focal deficit present.     Mental Status: She is alert and oriented to person, place, and time.  Psychiatric:        Mood and Affect: Mood and affect normal.   ASSESSMENT AND PLAN:  Ms. Zellman was seen today for 4 days of respiratory symptoms and headache.  URI, acute Symptoms suggests a viral etiology, I explained that symptomatic treatment is recommended. COVID-19 test done in the office negative. Instructed to monitor for signs of complications and to check temperature. Clearly instructed about warning signs. Adequate hydration, rest, and Flonase nasal spray in each nostril daily for 10 to 14 days as well as nasal saline irrigations to help with nasal congestion.  F/U as needed.  Cough, unspecified type Explained that cough and congestion can last a few more days and even weeks after acute symptoms resolved. I do not think imaging is needed at this time. Benzonatate recommended for cough management.  -      POC COVID-19 BinaxNow -     Benzonatate; Take 2 capsules (200 mg total) by mouth 2 (two) times daily as needed for up to 10 days.  Dispense: 30 capsule; Refill: 0  Headache, unspecified headache type She is concerned about headache, examination does not suggest a serious process. Most likely related to current viral illness. I do not think imaging is needed at this time. She was clearly instructed about warning signs. She voices understanding and agrees with plan.  Return if symptoms worsen or fail to improve.  Nijah Orlich G. [place], MD  [facility]. [place] office.

## 2022-08-08 ENCOUNTER — Encounter: Payer: Self-pay | Admitting: Internal Medicine

## 2022-10-11 ENCOUNTER — Ambulatory Visit: Payer: 59 | Admitting: Internal Medicine
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
## 2022-10-12 ENCOUNTER — Encounter: Payer: Self-pay | Admitting: Internal Medicine

## 2022-10-12 ENCOUNTER — Ambulatory Visit (INDEPENDENT_AMBULATORY_CARE_PROVIDER_SITE_OTHER): Payer: 59 | Admitting: Internal Medicine

## 2022-10-12 VITALS — BP 130/80 | HR 70 | Temp 98.2°F | Ht 65.0 in | Wt 234.1 lb

## 2022-10-12 DIAGNOSIS — M62838 Other muscle spasm: Secondary | ICD-10-CM

## 2022-10-12 DIAGNOSIS — S161XXA Strain of muscle, fascia and tendon at neck level, initial encounter: Secondary | ICD-10-CM

## 2022-10-12 DIAGNOSIS — S39012A Strain of muscle, fascia and tendon of lower back, initial encounter: Secondary | ICD-10-CM

## 2022-10-12 MED ORDER — CYCLOBENZAPRINE HCL 10 MG PO TABS: 10.0000 mg | ORAL_TABLET | Freq: Three times a day (TID) | ORAL | 0 refills | Status: DC | PRN

## 2022-10-12 NOTE — Patient Instructions (Signed)
Good to see you today.   Continue  as discussed   will refill flexeril  at night   as needed tylenol .  If spasms and pain   not improving next week contact  us and we can do a referral to PT  or any time.   Plan fu visit  in  2-4 weeks   or as needed

## 2023-01-03 NOTE — Progress Notes (Unsigned)
No chief complaint on file.   HPI: Crystal Townsend 57 y.o. come in for  concern about leg pain  ROS: See pertinent positives and negatives per HPI.  Past Medical History:  Diagnosis Date   Allergic rhinitis    Allergy    Anxiety    Chest pain    Hx, no problems as of 08/10/15   Depression    Diabetes mellitus    Type II   Dysmenorrhea    Fatigue    Hx, no current problems as of 08/10/15   Fibroids    GERD (gastroesophageal reflux disease)    diet controlled, no meds   Hyperlipidemia    Hypertension    IBS (irritable bowel syndrome)    no meds, diet controlled   Impaired fasting glucose    Knee pain, bilateral    Hx, no current problems as of 08/10/15   Leukoplakia oral mucosa    2011   Menorrhagia    OSA (obstructive sleep apnea)    does not use cpap, never went to get machine   Other dysfunctions of sleep stages or arousal from sleep    Sleep apnea    Does not use CPAP   Snoring    had a sleep study mild osa 2009    Family History  Problem Relation Age of Onset   Lung cancer Mother    Cancer Mother        lung    Prostate cancer Father    Cancer Father        prostate   Diabetes Father    Hyperlipidemia Father    Hypertension Father    Asthma Father    Cancer Maternal Aunt        breast   Cancer Maternal Aunt        breast   Cancer Maternal Aunt        breast   Colon polyps Neg Hx    Esophageal cancer Neg Hx    Rectal cancer Neg Hx    Stomach cancer Neg Hx     Social History   Socioeconomic History   Marital status: Single    Spouse name: Not on file   Number of children: Not on file   Years of education: Not on file   Highest education level: Not on file  Occupational History   Not on file  Tobacco Use   Smoking status: Never   Smokeless tobacco: Never  Substance and Sexual Activity   Alcohol use: Yes    Comment: social wine   Drug use: No   Sexual activity: Not Currently    Birth control/protection: I.U.D.    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   Son 19 doing ok working Engineer, mining of Corporate investment banker Strain: Not on [employer] Insecurity: Low Risk  (10/07/2022)   Received from [facility], [facility]   Hunger Vital Sign    Worried About Running Out of Food in the Last Year: Never true    Ran Out of Food in the Last Year: Never true  Transportation Needs: No Transportation Needs (10/07/2022)   [facility]   Transportation    In the past 12 months, has  lack of reliable transportation kept you from medical appointments, meetings, work or from getting things needed for daily living? : No  Physical Activity: Not on file  Stress: Not on file  Social Connections: Not on file    Outpatient Medications Prior to Visit  Medication Sig Dispense Refill   aspirin 81 MG EC tablet Take 1 tablet (81 mg total) by mouth daily. Swallow whole. 100 tablet 2   atorvastatin (LIPITOR) 20 MG tablet TAKE 1 TABLET BY MOUTH ONCE DAILY 30 tablet 0   Biotin 1000 MCG tablet Take 2,000 mcg by mouth daily.      cetirizine (ZYRTEC) 10 MG tablet Take 1 tablet (10 mg total) by mouth daily. 90 tablet 3   cyclobenzaprine (FLEXERIL) 10 MG tablet Take 1 tablet (10 mg total) by mouth 3 (three) times daily as needed for muscle spasms. 24 tablet 0   empagliflozin (JARDIANCE) 10 MG TABS tablet TAKE 1 TABLET(10 MG) BY MOUTH DAILY. 90 tablet 0   fluticasone (FLONASE) 50 MCG/ACT nasal spray 2 spray each nostril qd 16 g 11   ibuprofen (ADVIL) 800 MG tablet Take 800 mg by mouth every 8 (eight) hours as needed for moderate pain.     Multiple Vitamin (MULTI-VITAMIN PO) Take by mouth.     omeprazole (PRILOSEC) 40 MG capsule Take 1 capsule (40 mg total) by mouth daily. 90 capsule 3   OPTIVE SENSITIVE 0.5-0.9 %  SOLN Place 1 drop into both eyes 3 (three) times daily.   11   telmisartan-hydrochlorothiazide (MICARDIS HCT) 80-25 MG tablet Take 1 tablet by mouth daily.     VITAMIN D PO Take 1 capsule by mouth daily.     No facility-administered medications prior to visit.     EXAM:  There were no vitals taken for this visit.  There is no height or weight on file to calculate BMI.  GENERAL: vitals reviewed and listed above, alert, oriented, appears well hydrated and in no acute distress HEENT: atraumatic, conjunctiva  clear, no obvious abnormalities on inspection of external nose and ears OP : no lesion edema or exudate  NECK: no obvious masses on inspection palpation  LUNGS: clear to auscultation bilaterally, no wheezes, rales or rhonchi, good air movement CV: HRRR, no clubbing cyanosis or  peripheral edema nl cap refill  MS: moves all extremities without noticeable focal  abnormality PSYCH: pleasant and cooperative, no obvious depression or anxiety Lab Results  Component Value Date   WBC 4.7 07/26/2021   HGB 14.0 07/26/2021   HCT 42.9 07/26/2021   PLT 348.0 07/26/2021   GLUCOSE 89 07/26/2021   CHOL 287 (H) 07/26/2021   TRIG 80.0 07/26/2021   HDL 67.50 07/26/2021   LDLDIRECT 162.5 03/07/2013   LDLCALC 204 (H) 07/26/2021   ALT 20 07/26/2021   AST 24 07/26/2021   NA 139 07/26/2021   K 4.6 07/26/2021   CL 102 07/26/2021   CREATININE 1.11 07/26/2021   BUN 18 07/26/2021   CO2 31 07/26/2021   TSH 0.95 07/26/2021   HGBA1C 5.7 07/26/2021   MICROALBUR <0.7 07/26/2021   BP Readings from Last 3 Encounters:  10/12/22 130/80  06/08/22 120/80  05/23/22 106/76    ASSESSMENT AND PLAN:  Discussed the following assessment and plan:  No diagnosis found.  -Patient advised to return or notify health care team  if  new concerns arise.  There are no Patient Instructions on file for this visit.   Neta Mends. Selen Smucker M.D.

## 2023-01-04 ENCOUNTER — Ambulatory Visit: Payer: 59 | Admitting: Internal Medicine

## 2023-01-04 ENCOUNTER — Encounter: Payer: Self-pay | Admitting: Internal Medicine

## 2023-01-04 VITALS — BP 108/80 | HR 86 | Temp 98.0°F | Ht 65.0 in | Wt 232.4 lb

## 2023-01-04 DIAGNOSIS — M79605 Pain in left leg: Secondary | ICD-10-CM

## 2023-01-04 DIAGNOSIS — M79604 Pain in right leg: Secondary | ICD-10-CM

## 2023-01-04 DIAGNOSIS — E785 Hyperlipidemia, unspecified: Secondary | ICD-10-CM

## 2023-01-04 DIAGNOSIS — Z79899 Other long term (current) drug therapy: Secondary | ICD-10-CM | POA: Diagnosis not present

## 2023-01-04 DIAGNOSIS — R7303 Prediabetes: Secondary | ICD-10-CM | POA: Diagnosis not present

## 2023-01-04 DIAGNOSIS — M62838 Other muscle spasm: Secondary | ICD-10-CM

## 2023-01-04 DIAGNOSIS — I10 Essential (primary) hypertension: Secondary | ICD-10-CM | POA: Diagnosis not present

## 2023-01-04 DIAGNOSIS — R739 Hyperglycemia, unspecified: Secondary | ICD-10-CM

## 2023-01-04 LAB — LIPID PANEL
Cholesterol: 189 mg/dL (ref 0–200)
HDL: 61.5 mg/dL (ref >39.00)
LDL Cholesterol: 98 mg/dL (ref 0–99)
NonHDL: 127.78
Total CHOL/HDL Ratio: 3
Triglycerides: 150 mg/dL — ABNORMAL HIGH (ref 0.0–149.0)
VLDL: 30 mg/dL (ref 0.0–40.0)

## 2023-01-04 LAB — BASIC METABOLIC PANEL
BUN: 14 mg/dL (ref 6–23)
CO2: 34 meq/L — ABNORMAL HIGH (ref 19–32)
Calcium: 10 mg/dL (ref 8.4–10.5)
Chloride: 100 meq/L (ref 96–112)
Creatinine, Ser: 1.12 mg/dL (ref 0.40–1.20)
GFR: 53.9 mL/min — ABNORMAL LOW (ref >60.00)
Glucose, Bld: 119 mg/dL — ABNORMAL HIGH (ref 70–99)
Potassium: 3.8 meq/L (ref 3.5–5.1)
Sodium: 141 meq/L (ref 135–145)

## 2023-01-04 LAB — HEPATIC FUNCTION PANEL
ALT: 15 U/L (ref 0–35)
AST: 18 U/L (ref 0–37)
Albumin: 4.5 g/dL (ref 3.5–5.2)
Alkaline Phosphatase: 75 U/L (ref 39–117)
Bilirubin, Direct: 0.1 mg/dL (ref 0.0–0.3)
Total Bilirubin: 0.5 mg/dL (ref 0.2–1.2)
Total Protein: 8 g/dL (ref 6.0–8.3)

## 2023-01-04 LAB — CBC WITH DIFFERENTIAL/PLATELET
Basophils Absolute: 0 10*3/uL (ref 0.0–0.1)
Basophils Relative: 0.4 % (ref 0.0–3.0)
Eosinophils Absolute: 0.2 10*3/uL (ref 0.0–0.7)
Eosinophils Relative: 3.9 % (ref 0.0–5.0)
HCT: 41.5 % (ref 36.0–46.0)
Hemoglobin: 13.3 g/dL (ref 12.0–15.0)
Lymphocytes Relative: 32.1 % (ref 12.0–46.0)
Lymphs Abs: 2 10*3/uL (ref 0.7–4.0)
MCHC: 32.1 g/dL (ref 30.0–36.0)
MCV: 88.8 fL (ref 78.0–100.0)
Monocytes Absolute: 0.5 10*3/uL (ref 0.1–1.0)
Monocytes Relative: 8.7 % (ref 3.0–12.0)
Neutro Abs: 3.4 10*3/uL (ref 1.4–7.7)
Neutrophils Relative %: 54.9 % (ref 43.0–77.0)
Platelets: 377 10*3/uL (ref 150.0–400.0)
RBC: 4.67 Mil/uL (ref 3.87–5.11)
RDW: 15.3 % (ref 11.5–15.5)
WBC: 6.2 10*3/uL (ref 4.0–10.5)

## 2023-01-04 LAB — POCT GLYCOSYLATED HEMOGLOBIN (HGB A1C): Hemoglobin A1C: 6.8 % — AB (ref 4.0–5.6)

## 2023-01-04 LAB — MAGNESIUM: Magnesium: 2 mg/dL (ref 1.5–2.5)

## 2023-01-04 LAB — TSH: TSH: 0.98 u[IU]/mL (ref 0.35–5.50)

## 2023-01-04 LAB — MICROALBUMIN / CREATININE URINE RATIO
Creatinine,U: 47.7 mg/dL
Microalb Creat Ratio: 1.5 mg/g (ref 0.0–30.0)
Microalb, Ur: <0.7 mg/dL (ref 0.0–1.9)

## 2023-01-04 LAB — VITAMIN B12: Vitamin B-12: 461 pg/mL (ref 211–911)

## 2023-01-04 LAB — T4, FREE: Free T4: 0.79 ng/dL (ref 0.60–1.60)

## 2023-01-04 LAB — VITAMIN D 25 HYDROXY (VIT D DEFICIENCY, FRACTURES): VITD: 33.77 ng/mL (ref 30.00–100.00)

## 2023-01-04 LAB — C-REACTIVE PROTEIN: CRP: 1.5 mg/dL (ref 0.5–20.0)

## 2023-01-04 LAB — SEDIMENTATION RATE: Sed Rate: 81 mm/h — ABNORMAL HIGH (ref 0–30)

## 2023-01-04 LAB — CK: Total CK: 206 U/L — ABNORMAL HIGH (ref 7–177)

## 2023-01-04 NOTE — Patient Instructions (Signed)
Checking lab  in case metabolic cause of muscle leg pain. In interim  stop the atorvastatin temporarily. Pulses are good in  your feet  today. Next step depending on lab .

## 2023-01-23 ENCOUNTER — Telehealth: Payer: Self-pay | Admitting: Internal Medicine

## 2023-01-23 NOTE — Telephone Encounter (Signed)
Reviewed labs on mychart, asking for a call to discuss

## 2023-02-09 ENCOUNTER — Other Ambulatory Visit: Payer: Self-pay | Admitting: Internal Medicine

## 2023-02-16 ENCOUNTER — Other Ambulatory Visit: Payer: Self-pay | Admitting: Internal Medicine

## 2023-02-23 NOTE — Telephone Encounter (Signed)
Pt checking on progress of this request

## 2023-02-24 ENCOUNTER — Telehealth: Payer: Self-pay | Admitting: Internal Medicine

## 2023-02-24 NOTE — Telephone Encounter (Signed)
Patient is kinda upset--stated she called the first time at least 2 weeks ago.  She has called since then to get a return call with no success.  She is at least wanting a return call on Monday to touch base with her even if Dr. Fabian Sharp is not in the office.  She states she wants [place] to tell her if she needs to go to another dr to get care or not.    She saw her results on MyChart and said that she is concerned about her body, one example being her kidney function.  Pt states she drinks 32-96 oz, however only urinates 3 times daily.

## 2023-02-27 NOTE — Telephone Encounter (Signed)
Spoke to pt. Apologized to pt about the delay.   Went over lab result note to pt. Pt reports she has been drinking over 96oz every day. Only drink tea on occasion. Drinks 2 of 64 oz of water at work and in the evening drinks over 100 oz of water. She states she can try to drink more water but don't know how much more water she needs to drink.   She states she still has leg pain on both sides. She had stopped taking some of her vitamins (hair,nail, skin vitamin; vitamin D). But still taking her multi-vitamin. She had stopped taking her rosuvastatin. Been taking tylenol OTC as needed.   Inform pt I will forward the message to provider to review and see if any thing change. But I can go ahead and schedule a lab appt.   Lab appt is scheduled for Monday, Dec. 2. Since our office is close on Friday. November 29.    Please advise.

## 2023-02-28 ENCOUNTER — Other Ambulatory Visit: Payer: Self-pay | Admitting: Internal Medicine

## 2023-02-28 DIAGNOSIS — M79604 Pain in right leg: Secondary | ICD-10-CM

## 2023-02-28 DIAGNOSIS — R700 Elevated erythrocyte sedimentation rate: Secondary | ICD-10-CM

## 2023-02-28 DIAGNOSIS — E785 Hyperlipidemia, unspecified: Secondary | ICD-10-CM

## 2023-02-28 DIAGNOSIS — Z79899 Other long term (current) drug therapy: Secondary | ICD-10-CM

## 2023-02-28 DIAGNOSIS — M79605 Pain in left leg: Secondary | ICD-10-CM

## 2023-02-28 DIAGNOSIS — R944 Abnormal results of kidney function studies: Secondary | ICD-10-CM

## 2023-02-28 NOTE — Telephone Encounter (Signed)
Brought it up to Dr. Fabian Sharp.   She planned for pt to do lab tomorrow and follow up with her on 03/07/2023.   Contacted pt. Pt agreed with it. Appt scheduled.   Forwarding to provider for an update.

## 2023-02-28 NOTE — Telephone Encounter (Signed)
Apologies  for delay updated labs   NO alarm findings but  abnormalcies need fu ordered and fu visit as planned

## 2023-02-28 NOTE — Progress Notes (Signed)
Future orders  and then OV to review and update . Mild abnormalities on lab  elevated esr but nl crp

## 2023-02-28 NOTE — Telephone Encounter (Signed)
Spoke to pt. Inform her that provider would like for her to seen in office. Verbalized understanding.   Pt states she prefers to be seen in the morning. Appt is scheduled with Dr. Clent Ridges for tomorrow.   Pt ask if she still needs to get her blood drawn. Please advise.

## 2023-03-01 ENCOUNTER — Ambulatory Visit: Payer: 59 | Admitting: Family Medicine

## 2023-03-01 ENCOUNTER — Other Ambulatory Visit: Payer: 59

## 2023-03-01 NOTE — Telephone Encounter (Signed)
Pt has lab appt today and f/u appt with Dr. Fabian Sharp on 12/3.

## 2023-03-06 ENCOUNTER — Other Ambulatory Visit: Payer: 59

## 2023-03-07 ENCOUNTER — Ambulatory Visit: Payer: 59

## 2023-03-07 ENCOUNTER — Ambulatory Visit (INDEPENDENT_AMBULATORY_CARE_PROVIDER_SITE_OTHER): Payer: 59 | Admitting: Internal Medicine

## 2023-03-07 ENCOUNTER — Encounter: Payer: Self-pay | Admitting: Internal Medicine

## 2023-03-07 VITALS — BP 104/78 | HR 83 | Temp 98.6°F | Ht 65.0 in | Wt 233.0 lb

## 2023-03-07 DIAGNOSIS — R700 Elevated erythrocyte sedimentation rate: Secondary | ICD-10-CM

## 2023-03-07 DIAGNOSIS — Z79899 Other long term (current) drug therapy: Secondary | ICD-10-CM

## 2023-03-07 DIAGNOSIS — E785 Hyperlipidemia, unspecified: Secondary | ICD-10-CM | POA: Diagnosis not present

## 2023-03-07 DIAGNOSIS — R944 Abnormal results of kidney function studies: Secondary | ICD-10-CM | POA: Diagnosis not present

## 2023-03-07 DIAGNOSIS — M79604 Pain in right leg: Secondary | ICD-10-CM

## 2023-03-07 DIAGNOSIS — M545 Low back pain, unspecified: Secondary | ICD-10-CM

## 2023-03-07 DIAGNOSIS — M79605 Pain in left leg: Secondary | ICD-10-CM

## 2023-03-07 NOTE — Patient Instructions (Addendum)
Update lab .   Agree with staying off meds and supplement now . Lab today .  X ray  urine if possible.  We may have you see a specialist. .

## 2023-03-07 NOTE — Progress Notes (Signed)
Chief Complaint  Patient presents with   Medical Management of Chronic Issues    Pt is here to discuss on lab result. Concerns about her kidney function.    Muscle Pain    Pt c/o muscle spasm on Left lower pelvice area. Been going on since last month when came in to be seen/     HPI: Crystal Townsend 57 y.o. come in for fu sx /.  Fu lab from last   lab was ordered not done.Marland Kitchen  appts got  confused  but here today for visit  Ongoing problem that is limiting her walking .  Proximal lateral  bilateral leg ache when walking and using  heating pad under legs . Toes cramp up.at night   And using heating pad.to uncramp  Bp med has been tolerated for a long time  and no new meds   Left  lower back muscle spasm left lower back lasting   5- 10 seconds   And sometimes  abd   muscle spasm recently. Used heating pad   Had stopped the atorvastatin since last visit . And all non necessary supplements and no change   ROS: See pertinent positives and negatives per HPI. Feels dec UO if nottaking diuretic . No acute joint selling and no bowel or bladder changes   Past Medical History:  Diagnosis Date   Allergic rhinitis    Allergy    Anxiety    Chest pain    Hx, no problems as of 08/10/15   Depression    Diabetes mellitus    Type II   Dysmenorrhea    Fatigue    Hx, no current problems as of 08/10/15   Fibroids    GERD (gastroesophageal reflux disease)    diet controlled, no meds   Hyperlipidemia    Hypertension    IBS (irritable bowel syndrome)    no meds, diet controlled   Impaired fasting glucose    Knee pain, bilateral    Hx, no current problems as of 08/10/15   Leukoplakia oral mucosa    2011   Menorrhagia    OSA (obstructive sleep apnea)    does not use cpap, never went to get machine   Other dysfunctions of sleep stages or arousal from sleep    Sleep apnea    Does not use CPAP   Snoring    had a sleep study mild osa 2009    Family History  Problem Relation Age of Onset    Lung cancer Mother    Cancer Mother        lung    Prostate cancer Father    Cancer Father        prostate   Diabetes Father    Hyperlipidemia Father    Hypertension Father    Asthma Father    Cancer Maternal Aunt        breast   Cancer Maternal Aunt        breast   Cancer Maternal Aunt        breast   Colon polyps Neg Hx    Esophageal cancer Neg Hx    Rectal cancer Neg Hx    Stomach cancer Neg Hx     Social History   Socioeconomic History   Marital status: Single    Spouse name: Not on file   Number of children: Not on file   Years of education: Not on file   Highest education level: Not on file  Occupational  History   Not on file  Tobacco Use   Smoking status: Never   Smokeless tobacco: Never  Substance and Sexual Activity   Alcohol use: Yes    Comment: social wine   Drug use: No   Sexual activity: Not Currently    Birth control/protection: I.U.D.    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   Son 19 doing ok working Engineer, mining of [employer] investment [employer] Strain: Not on [employer] Insecurity: Low Risk  (10/07/2022)   Received from [facility], [facility]   Hunger Vital Sign    Worried About Running Out of Food in the Last Year: Never true    Ran Out of Food in the Last Year: Never true  Transportation Needs: No Transportation Needs (10/07/2022)   [facility]   Transportation    In the past 12 months, has lack of reliable transportation kept you from medical appointments, meetings, work or from getting things needed for daily living? : No  Physical Activity: Not on file  Stress: Not on file  Social Connections: Not on file    Outpatient Medications Prior to Visit  Medication Sig Dispense Refill   aspirin 81 MG EC tablet Take  1 tablet (81 mg total) by mouth daily. Swallow whole. 100 tablet 2   cetirizine (ZYRTEC) 10 MG tablet TAKE 1 TABLET(10 MG) BY MOUTH DAILY 90 tablet 3   JARDIANCE 10 MG TABS tablet TAKE 1 TABLET(10 MG) BY MOUTH DAILY 90 tablet 0   Multiple Vitamin (MULTI-VITAMIN PO) Take by mouth.     telmisartan-hydrochlorothiazide (MICARDIS HCT) 80-25 MG tablet Take 1 tablet by mouth daily.     atorvastatin (LIPITOR) 20 MG tablet TAKE 1 TABLET BY MOUTH ONCE DAILY (Patient not taking: Reported on 03/07/2023) 30 tablet 0   Biotin 1000 MCG tablet Take 2,000 mcg by mouth daily.  (Patient not taking: Reported on 03/07/2023)     cyclobenzaprine (FLEXERIL) 10 MG tablet Take 1 tablet (10 mg total) by mouth 3 (three) times daily as needed for muscle spasms. (Patient not taking: Reported on 03/07/2023) 24 tablet 0   fluticasone (FLONASE) 50 MCG/ACT nasal spray 2 spray each nostril qd (Patient not taking: Reported on 03/07/2023) 16 g 11   ibuprofen (ADVIL) 800 MG tablet Take 800 mg by mouth every 8 (eight) hours as needed for moderate pain. (Patient not taking: Reported on 03/07/2023)     omeprazole (PRILOSEC) 40 MG capsule Take 1 capsule (40 mg total) by mouth daily. (Patient not taking: Reported on 03/07/2023) 90 capsule 3   OPTIVE SENSITIVE 0.5-0.9 % SOLN Place 1 drop into both eyes 3 (three) times daily.  (Patient not taking: Reported on 03/07/2023)  11   VITAMIN D PO Take 1 capsule by mouth daily. (Patient not taking: Reported on 03/07/2023)     No facility-administered medications prior to visit.     EXAM:  BP 104/78 (BP Location: Left Arm, Patient Position: Sitting, Cuff Size: Large)   Pulse 83   Temp 98.6 F (37 C) (Oral)   Ht 5\' 5"  (1.651 m)   Wt 233 lb (105.7 kg)   SpO2 96%   BMI 38.77 kg/m   Body mass index is 38.77 kg/m.  GENERAL: vitals  reviewed and listed above, alert, oriented, appears well hydrated and in no acute distress HEENT: atraumatic, conjunctiva  clear, no obvious abnormalities on inspection  of external nose and ears NECK: no obvious masses on inspection palpation  CV: HRRR, no clubbing cyanosis or  peripheral edema nl cap refill  ? Mid low back tender   gait  slow  but  symmetrical  cautious  no obious weakness   MS: moves all extremities without noticeable focal  abnormality PSYCH: pleasant and cooperative, no obvious depression or anxiety Lab Results  Component Value Date   WBC 6.2 01/04/2023   HGB 13.3 01/04/2023   HCT 41.5 01/04/2023   PLT 377.0 01/04/2023   GLUCOSE 119 (H) 01/04/2023   CHOL 189 01/04/2023   TRIG 150.0 (H) 01/04/2023   HDL 61.50 01/04/2023   LDLDIRECT 162.5 03/07/2013   LDLCALC 98 01/04/2023   ALT 15 01/04/2023   AST 18 01/04/2023   NA 141 01/04/2023   K 3.8 01/04/2023   CL 100 01/04/2023   CREATININE 1.12 01/04/2023   BUN 14 01/04/2023   CO2 34 (H) 01/04/2023   TSH 0.98 01/04/2023   HGBA1C 6.8 (A) 01/04/2023   MICROALBUR <0.7 01/04/2023   BP Readings from Last 3 Encounters:  03/07/23 104/78  01/04/23 108/80  10/12/22 130/80    ASSESSMENT AND PLAN:  Discussed the following assessment and plan:  Leg pain, bilateral - myalgia. - Plan: DG Lumbar Spine Complete, Urinalysis, Routine w reflex microscopic, Rheumatoid Factor, ANA, Cystatin C with Glomerular Filtration Rate, Estimated (eGFR), Aldolase, CK, Sedimentation rate, Basic metabolic panel  Midline low back pain, unspecified chronicity, unspecified whether sciatica present - Plan: DG Lumbar Spine Complete, Urinalysis, Routine w reflex microscopic  Medication management - Plan: Rheumatoid Factor, ANA, Cystatin C with Glomerular Filtration Rate, Estimated (eGFR), Aldolase, CK, Sedimentation rate, Basic metabolic panel  Decreased glomerular filtration rate (GFR) - Plan: Rheumatoid Factor, ANA, Cystatin C with Glomerular Filtration Rate, Estimated (eGFR), Aldolase, CK, Sedimentation rate, Basic metabolic panel  Hyperlipidemia, unspecified hyperlipidemia type - Plan: Rheumatoid Factor, ANA,  Cystatin C with Glomerular Filtration Rate, Estimated (eGFR), Aldolase, CK, Sedimentation rate, Basic metabolic panel  ESR raised - Plan: Rheumatoid Factor, ANA, Cystatin C with Glomerular Filtration Rate, Estimated (eGFR), Aldolase, CK, Sedimentation rate, Basic metabolic panel Curious presentation   stopped  statin and supplements uncertain cause but sig changes in ability to walk and exercise   consider  atypical back and radation. ? Se of other meds . Autoimmune sx ?  Consider pad  screen .  Repeat  fu renal function .  Lab  and  dg ls spine x ray today  -Patient advised to return or notify health care team  if  new concerns arise.  Patient Instructions  Update lab .   Agree with staying off meds and supplement now . Lab today .  X ray  urine if possible.  We may have you see a specialist. .   Neta Mends. Eoghan Belcher M.D.

## 2023-03-08 LAB — SEDIMENTATION RATE: Sed Rate: 44 mm/h — ABNORMAL HIGH (ref 0–30)

## 2023-03-08 LAB — URINALYSIS, ROUTINE W REFLEX MICROSCOPIC
Bilirubin Urine: NEGATIVE
Ketones, ur: NEGATIVE
Leukocytes,Ua: NEGATIVE
Nitrite: NEGATIVE
RBC / HPF: NONE SEEN (ref 0–?)
Specific Gravity, Urine: 1.025 (ref 1.000–1.030)
Total Protein, Urine: NEGATIVE
Urine Glucose: >=1000 — AB
Urobilinogen, UA: 0.2 (ref 0.0–1.0)
pH: 5.5 (ref 5.0–8.0)

## 2023-03-08 LAB — BASIC METABOLIC PANEL
BUN: 14 mg/dL (ref 6–23)
CO2: 30 meq/L (ref 19–32)
Calcium: 10.1 mg/dL (ref 8.4–10.5)
Chloride: 100 meq/L (ref 96–112)
Creatinine, Ser: 1.09 mg/dL (ref 0.40–1.20)
GFR: 55.62 mL/min — ABNORMAL LOW (ref >60.00)
Glucose, Bld: 85 mg/dL (ref 70–99)
Potassium: 4.7 meq/L (ref 3.5–5.1)
Sodium: 140 meq/L (ref 135–145)

## 2023-03-08 LAB — CK: Total CK: 175 U/L (ref 7–177)

## 2023-03-12 LAB — RHEUMATOID FACTOR

## 2023-03-12 LAB — ALDOLASE: Aldolase: 6 U/L (ref <=8.1)

## 2023-03-12 LAB — ANA

## 2023-03-14 NOTE — Progress Notes (Signed)
The results that we have received  are improved esr  and BG but the RF and ana tests and otheers s  were not done for specimen lab issue ? Order was canceled by lab? Please have lab correct this problem redraw at no  charge  and get specimen to correct place

## 2023-03-15 ENCOUNTER — Other Ambulatory Visit: Payer: Self-pay

## 2023-03-15 DIAGNOSIS — R944 Abnormal results of kidney function studies: Secondary | ICD-10-CM

## 2023-03-15 DIAGNOSIS — M545 Low back pain, unspecified: Secondary | ICD-10-CM

## 2023-03-15 DIAGNOSIS — R700 Elevated erythrocyte sedimentation rate: Secondary | ICD-10-CM

## 2023-03-15 DIAGNOSIS — M79604 Pain in right leg: Secondary | ICD-10-CM

## 2023-03-15 DIAGNOSIS — Z79899 Other long term (current) drug therapy: Secondary | ICD-10-CM

## 2023-03-16 ENCOUNTER — Other Ambulatory Visit: Payer: 59

## 2023-03-16 DIAGNOSIS — R700 Elevated erythrocyte sedimentation rate: Secondary | ICD-10-CM

## 2023-03-16 DIAGNOSIS — M79604 Pain in right leg: Secondary | ICD-10-CM

## 2023-03-16 DIAGNOSIS — Z79899 Other long term (current) drug therapy: Secondary | ICD-10-CM

## 2023-03-16 DIAGNOSIS — R944 Abnormal results of kidney function studies: Secondary | ICD-10-CM

## 2023-03-19 LAB — CYSTATIN C WITH GLOMERULAR FILTRATION RATE, ESTIMATED (EGFR)
CYSTATIN C: 1.04 mg/L (ref 0.52–1.17)
eGFR: 69 mL/min/{1.73_m2} (ref >=60)

## 2023-03-19 LAB — RHEUMATOID FACTOR: Rheumatoid fact SerPl-aCnc: <10 [IU]/mL (ref <14)

## 2023-03-19 LAB — ANA: Anti Nuclear Antibody (ANA): NEGATIVE

## 2023-03-19 NOTE — Progress Notes (Signed)
Back x ray read and normal and no concerning finding no significant arthritis noted

## 2023-03-19 NOTE — Progress Notes (Signed)
Kidney function   is in normal range . Ana and rheumatoid factor is normal . Reassuring but not explaining the symptoms you are having . we can refer to physical therapy , ortho pedics or sports medicine to evaluate the upper leg pains .   Nl back x ray  .or we can wait and see if  resolves  on own   and consider physical therapy  Let us know if you wish a referral  otherwise  plan follow up in our clinic  in 1-2 months

## 2023-03-24 NOTE — Addendum Note (Signed)
Addended by: Vickii Chafe on: 03/24/2023 09:58 AM   Modules accepted: Orders

## 2023-04-06 ENCOUNTER — Telehealth: Payer: Self-pay | Admitting: Internal Medicine

## 2023-04-06 NOTE — Telephone Encounter (Signed)
 [employer] Benefits form to be filled out--placed in dr's folder.  Call pt upon completion for pickup.

## 2023-04-19 ENCOUNTER — Telehealth: Payer: Self-pay | Admitting: Internal Medicine

## 2023-04-19 ENCOUNTER — Ambulatory Visit: Payer: 59 | Admitting: Physical Therapy

## 2023-04-19 NOTE — Telephone Encounter (Signed)
 Spoke to pt. Inform pt we would need for her to sign a release of information before we send the information. Pt states she can do that but not happy that she was not aware of it when she dropped the form off. Also pt complain about the delay in reaching out to her, since she drop it off about 2 wks ago. Pt ask to speak with manager about this issues and that this is not a good costumer service. Inform pt, I will let our manager to know to speak with her. Pt states she will come in and will speak with manager.   Relay message to manager, Brittney K. and explain to her what's going on.   Manager suggest for provider to fill out the form as high priority when pt come in and sign release of information. And for pt to leave with the form.   Manager and this cma brought it up to Dr. Ethel Henry.   Form was filled and completed. Given form to pt. And also form was faxed.

## 2023-04-19 NOTE — Telephone Encounter (Signed)
 Copied from CRM [id number]. Topic: General - Other >> Apr 19, 2023  8:09 AM Crystal Townsend wrote: Reason for CRM: Patient is calling to confirm if her [employer] form is ready to be picked up.

## 2023-05-17 NOTE — Progress Notes (Signed)
So  have symptoms  changed  or progressed ?  I need specific info  to order an mri of back to hopefully be covered by insurance review. So here are some questions  that I would have reviewed if we could have a visit contact ...  So there is now  low back pain with  leg pains?  Is there weakness  in legs both or one? Any falling because of this problem? What has helped ? What makes it worse.  Any urine or bowel changes , What does the condition keep you from  performing in day to day activities . ? Any unintended weight loss. Do you think any medications are causing the problem? Are  your blood sugars in control?  After we get this information   I can place an order mri of back  to see if approved .  We can also Consideration of seeing  rheumatology or neurology if you are not getting better .  FYI tell patient  that forms  are automatically charged if not related to a visit. But if with  an office/virtual  visit ... no charges)

## 2023-05-18 NOTE — Progress Notes (Signed)
Attempted to reach pt. Left a voicemail to call us back.

## 2023-05-22 NOTE — Progress Notes (Signed)
Spoke to pt and explain to pt that provider would need to get more information to plan treatment better and get MRI approved. Inform pt, provider has a list of questions to ask her.   Pt ask if provider needs a OV, inform pt that would better. Pt states she is good on 05/24/2023.   Scheduled pt a virtual visit for 05/24/2023 @4 :15pm.

## 2023-05-24 ENCOUNTER — Telehealth: Payer: 59 | Admitting: Internal Medicine

## 2023-05-26 ENCOUNTER — Ambulatory Visit: Payer: 59

## 2023-05-30 ENCOUNTER — Other Ambulatory Visit: Payer: Self-pay | Admitting: Family

## 2023-06-06 ENCOUNTER — Emergency Department (HOSPITAL_COMMUNITY)
Admission: EM | Admit: 2023-06-06 | Discharge: 2023-06-06 | Disposition: A | Discharge status: Against Medical Advice | Attending: Emergency Medicine | Admitting: Emergency Medicine

## 2023-06-06 DIAGNOSIS — R319 Hematuria, unspecified: Secondary | ICD-10-CM | POA: Diagnosis present

## 2023-06-06 DIAGNOSIS — Z5321 Procedure and treatment not carried out due to patient leaving prior to being seen by health care provider: Secondary | ICD-10-CM | POA: Diagnosis not present

## 2023-06-06 NOTE — ED Triage Notes (Signed)
 Patient states she bleeding with clots when she voids. Patient does know know where it is coming from. Patient states the bleeding occurs as she uses the bathroom and when she is laying down. Patient is post-menopausal. Also reports some lower abdominal cramping. No blood thinners. Patient had a D&C 2 weeks ago. Patient had only a slight amount of bleeding after the procedure. Yesterday while urinating, she noticed the bleeding. Called her OBGYN. Patient has an appt at 930 today.

## 2023-06-06 NOTE — ED Notes (Signed)
 Patient decided to leave an be seen by her OBGYN this morning.

## 2023-06-07 ENCOUNTER — Encounter: Payer: Self-pay | Admitting: Internal Medicine

## 2023-06-07 ENCOUNTER — Telehealth (INDEPENDENT_AMBULATORY_CARE_PROVIDER_SITE_OTHER): Payer: 59 | Admitting: Internal Medicine

## 2023-06-07 VITALS — Wt 233.0 lb

## 2023-06-07 DIAGNOSIS — M545 Low back pain, unspecified: Secondary | ICD-10-CM

## 2023-06-07 DIAGNOSIS — M79605 Pain in left leg: Secondary | ICD-10-CM

## 2023-06-07 DIAGNOSIS — M79604 Pain in right leg: Secondary | ICD-10-CM

## 2023-06-07 NOTE — Progress Notes (Signed)
 Virtual Visit via Video Note  I connected with Crystal Townsend on 06/07/23 at  4:00 PM EST by a video enabled telemedicine application and verified that I am speaking with the correct person using two identifiers. Location patient: home Location provider:work office Persons participating in the virtual visit: patient, provider   Patient aware  of the limitations of evaluation and management by telemedicine and  availability of in person appointments. and agreed to proceed.   HPI: [employer] presents for video visit  fu upper thigh leg muscle like pain and midline LBP   Now taking more tylenol  worse at night and when up in am  stiff and pain   Daily tylenol to  make tolerable  but pain can be 10/10   No fever  had upper left ccp once no assoc sx  No bowel or bladder assoc sx but som urinary urgency  Recent had endometrial problem an on meds per gyne to have period.? No related to  sx.  Tried off atorva and no change in sx  Still taking bp and jardiance medication ROS: See pertinent positives and negatives per HPI.  Past Medical History:  Diagnosis Date   Allergic rhinitis    Allergy    Anxiety    Chest pain    Hx, no problems as of 08/10/15   Depression    Diabetes mellitus    Type II   Dysmenorrhea    Fatigue    Hx, no current problems as of 08/10/15   Fibroids    GERD (gastroesophageal reflux disease)    diet controlled, no meds   Hyperlipidemia    Hypertension    IBS (irritable bowel syndrome)    no meds, diet controlled   Impaired fasting glucose    Knee pain, bilateral    Hx, no current problems as of 08/10/15   Leukoplakia oral mucosa    2011   Menorrhagia    OSA (obstructive sleep apnea)    does not use cpap, never went to get machine   Other dysfunctions of sleep stages or arousal from sleep    Sleep apnea    Does not use CPAP   Snoring    had a sleep study mild osa 2009    Past Surgical History:  Procedure Laterality Date    BREAST LUMPECTOMY WITH RADIOACTIVE SEED LOCALIZATION Right 03/05/2015   Procedure: RIGHT BREAST LUMPECTOMY WITH RADIOACTIVE SEED LOCALIZATION;  Surgeon: Harriette Bouillon, MD;  Location: [facility];  Service: General;  Laterality: Right;   CESAREAN SECTION  1997   COLONOSCOPY  2007   [facility] - polyp   DILATION AND CURETTAGE OF UTERUS     ECTOPIC PREGNANCY SURGERY  2000   laparoscopy   oral biopsy      Family History  Problem Relation Age of Onset   Lung cancer Mother    Cancer Mother        lung    Prostate cancer Father    Cancer Father        prostate   Diabetes Father    Hyperlipidemia Father    Hypertension Father    Asthma Father    Cancer Maternal Aunt        breast   Cancer Maternal Aunt        breast   Cancer Maternal Aunt        breast   Colon polyps Neg Hx    Esophageal cancer Neg Hx    Rectal cancer  Neg Hx    Stomach cancer Neg Hx     Social History   Tobacco Use   Smoking status: Never   Smokeless tobacco: Never  Substance Use Topics   Alcohol use: Yes    Comment: social wine   Drug use: No      Current Outpatient Medications:    Acetaminophen (TYLENOL PO), Take by mouth. Pt reports she is taking 2 capsules a day., Disp: , Rfl:    aspirin 81 MG EC tablet, Take 1 tablet (81 mg total) by mouth daily. Swallow whole., Disp: 100 tablet, Rfl: 2   cetirizine (ZYRTEC) 10 MG tablet, TAKE 1 TABLET(10 MG) BY MOUTH DAILY, Disp: 90 tablet, Rfl: 3   JARDIANCE 10 MG TABS tablet, TAKE 1 TABLET(10 MG) BY MOUTH DAILY, Disp: 90 tablet, Rfl: 0   Multiple Vitamin (MULTI-VITAMIN PO), Take by mouth., Disp: , Rfl:    telmisartan-hydrochlorothiazide (MICARDIS HCT) 80-25 MG tablet, Take 1 tablet by mouth daily., Disp: , Rfl:    atorvastatin (LIPITOR) 20 MG tablet, TAKE 1 TABLET BY MOUTH ONCE DAILY (Patient not taking: Reported on 06/07/2023), Disp: 30 tablet, Rfl: 0   Biotin 1000 MCG tablet, Take 2,000 mcg by mouth daily.  (Patient not taking: Reported on 03/07/2023),  Disp: , Rfl:    cyclobenzaprine (FLEXERIL) 10 MG tablet, Take 1 tablet (10 mg total) by mouth 3 (three) times daily as needed for muscle spasms. (Patient not taking: Reported on 06/07/2023), Disp: 24 tablet, Rfl: 0   fluticasone (FLONASE) 50 MCG/ACT nasal spray, 2 spray each nostril qd (Patient not taking: Reported on 06/07/2023), Disp: 16 g, Rfl: 11   ibuprofen (ADVIL) 800 MG tablet, Take 800 mg by mouth every 8 (eight) hours as needed for moderate pain. (Patient not taking: Reported on 06/07/2023), Disp: , Rfl:    omeprazole (PRILOSEC) 40 MG capsule, Take 1 capsule (40 mg total) by mouth daily. (Patient not taking: Reported on 06/07/2023), Disp: 90 capsule, Rfl: 3   OPTIVE SENSITIVE 0.5-0.9 % SOLN, Place 1 drop into both eyes 3 (three) times daily.  (Patient not taking: Reported on 03/07/2023), Disp: , Rfl: 11   VITAMIN D PO, Take 1 capsule by mouth daily. (Patient not taking: Reported on 03/07/2023), Disp: , Rfl:   EXAM: BP Readings from Last 3 Encounters:  06/06/23 (!) 150/79  03/07/23 104/78  01/04/23 108/80    VITALS per patient if applicable:  GENERAL: alert, oriented, appears well and in no acute distress  HEENT: atraumatic, conjunttiva clear, no obvious abnormalities on inspection of external nose and ears  NECK: normal movements of the head and neck  LUNGS: on inspection no signs of respiratory distress, breathing rate appears normal, no obvious gross SOB, gasping or wheezing  CV: no obvious cyanosis  MS: moves all visible extremities without noticeable abnormality  PSYCH/NEURO: pleasant and cooperative, no obvious depression or anxiety, speech and thought processing grossly intact Lab Results  Component Value Date   WBC 6.2 01/04/2023   HGB 13.3 01/04/2023   HCT 41.5 01/04/2023   PLT 377.0 01/04/2023   GLUCOSE 85 03/07/2023   CHOL 189 01/04/2023   TRIG 150.0 (H) 01/04/2023   HDL 61.50 01/04/2023   LDLDIRECT 162.5 03/07/2013   LDLCALC 98 01/04/2023   ALT 15 01/04/2023   AST  18 01/04/2023   NA 140 03/07/2023   K 4.7 03/07/2023   CL 100 03/07/2023   CREATININE 1.09 03/07/2023   BUN 14 03/07/2023   CO2 30 03/07/2023   TSH 0.98 01/04/2023  HGBA1C 6.8 (A) 01/04/2023   MICROALBUR <0.7 01/04/2023    ASSESSMENT AND PLAN:  Discussed the following assessment and plan:    ICD-10-CM   1. Leg pain, bilateral  M79.604 MR Lumbar Spine Wo Contrast   M79.605     2. Midline low back pain, unspecified chronicity, unspecified whether sciatica present  M54.50 MR Lumbar Spine Wo Contrast   progressive not responding  to time and consdervative rx    Concern  of on going  progressive sx  Upper leg  sx sounds muscular poss  atypical weak feeling with lbp progressing not responding to conservative measures and often worse at night and stiff in am   (no change off atorvastatin)    No other  systemic sx except may be hand thumb sx .  Plan MRI of ls spine  and refer as appropriate  Pain peaks as a 10/10 and  Counseled.   Expectant management and discussion of plan and treatment with opportunity to ask questions and all were answered. The patient agreed with the plan and demonstrated an understanding of the instructions.   Advised to call back or seek an in-person evaluation if worsening  or having  further concerns  in interim. Return for depending on results.    Berniece Andreas, MD

## 2023-06-11 ENCOUNTER — Other Ambulatory Visit

## 2023-06-13 ENCOUNTER — Other Ambulatory Visit: Payer: Self-pay | Admitting: Internal Medicine

## 2023-06-15 MED ORDER — EMPAGLIFLOZIN 10 MG PO TABS: ORAL_TABLET | ORAL | 0 refills | Status: DC

## 2023-06-21 ENCOUNTER — Encounter: Payer: Self-pay | Admitting: Internal Medicine

## 2023-06-21 ENCOUNTER — Telehealth: Payer: Self-pay

## 2023-06-21 DIAGNOSIS — M79604 Pain in right leg: Secondary | ICD-10-CM

## 2023-06-21 DIAGNOSIS — M545 Low back pain, unspecified: Secondary | ICD-10-CM

## 2023-06-21 NOTE — Telephone Encounter (Signed)
 Copied from CRM [id number]. Topic: Clinical - Request for Lab/Test Order >> Jun 21, 2023 11:59 AM Orinda Kenner C wrote: Reason for CRM: Sunny Schlein from [facility] imaging [phone] the Berkley Harvey was denied, does the nurse want to do peer to peer review for MR Lumbar Spine without contrast. Sunny Schlein will keep the appointment until this Friday if the insurance approves. If not, the appointment will be cancel. Please call back.

## 2023-06-21 NOTE — Telephone Encounter (Signed)
 So tell patient  mri of spine was declined  by  insurance and send  her to a specialist Please do referral to  neurology  for back pain leg pain.

## 2023-06-22 NOTE — Addendum Note (Signed)
 Addended by: Vickii Chafe on: 06/22/2023 03:32 PM   Modules accepted: Orders

## 2023-06-22 NOTE — Telephone Encounter (Signed)
 Attempted to inform pt. Left a voicemail to call us back.

## 2023-06-23 ENCOUNTER — Encounter: Payer: Self-pay | Admitting: Internal Medicine

## 2023-06-24 ENCOUNTER — Other Ambulatory Visit

## 2023-06-26 ENCOUNTER — Other Ambulatory Visit: Payer: Self-pay | Admitting: Obstetrics and Gynecology

## 2023-06-26 DIAGNOSIS — Z1231 Encounter for screening mammogram for malignant neoplasm of breast: Secondary | ICD-10-CM

## 2023-06-26 NOTE — Telephone Encounter (Signed)
 Spoke to pt and inform her of message below. Also that we placed a referral for her.   Pt states no one had reach out to her yet. Inform pt if still no call from someone about the referral by next week, to call us. Pt verbalized understanding.   Pt ask about her potassium level that she sent message to mychart. Inform her potassium was in normal limit but will send her questions to Dr. Fabian Sharp to review and address. Pt verbalized understanding.

## 2023-06-27 NOTE — Telephone Encounter (Signed)
 Potassium was 4.7  totally normal range  ,  so doesn't seem to be   related .

## 2023-06-30 ENCOUNTER — Encounter: Payer: Self-pay | Admitting: Obstetrics and Gynecology

## 2023-06-30 NOTE — Progress Notes (Signed)
 Returned call from GYN answering service. Patient reports having severe abdominal pain, chills, nausea and decreased appetite. No vaginal bleeding or abnormal vaginal discharge. Notes last time pain felt similar was with ectopic pregnancy but states she knows she is not pregnant. Took dulcolax 2 days ago without improvement. Reports having D&C like procedure in office for thickened lining followed by medication to cause a bleed about 1 month ago. Recommend trial of OTC analgesia and if no improvement or development of fever and/or po intolerant, will need urgent care/ED evaluation

## 2023-10-24 ENCOUNTER — Encounter: Payer: Self-pay | Admitting: Internal Medicine

## 2023-10-24 LAB — HM DIABETES EYE EXAM

## 2023-11-01 ENCOUNTER — Ambulatory Visit: Admitting: Neurology

## 2023-11-01 ENCOUNTER — Encounter: Payer: Self-pay | Admitting: Neurology

## 2023-11-01 VITALS — BP 140/84 | HR 84 | Ht 65.0 in | Wt 236.6 lb

## 2023-11-01 DIAGNOSIS — G8929 Other chronic pain: Secondary | ICD-10-CM

## 2023-11-01 DIAGNOSIS — M5440 Lumbago with sciatica, unspecified side: Secondary | ICD-10-CM

## 2023-11-01 MED ORDER — TIZANIDINE HCL 4 MG PO TABS: 4.0000 mg | ORAL_TABLET | Freq: Four times a day (QID) | ORAL | 6 refills | Status: AC | PRN

## 2023-11-01 NOTE — Progress Notes (Unsigned)
 Chief Complaint  Patient presents with   New Patient (Initial Visit)    Room 14 Pt is here alone Pt has been having lower back pain and pain in both legs. She burning when walking a distance, lower back pain  when standing for longtime. She is taking ibuprofen  at night  b/c of  pain ,she stated that  this has been going on for 1 year.      ASSESSMENT AND PLAN  Crystal Townsend is a 58 y.o. female   Chronic low back pain, radiating pain to bilateral lower extremity  MRI of lumbar spine for evaluation of possible lumbar radiculopathy  Referred to physical therapy,  Tizanidine  as needed  Follow-up plan depend on MRI findings  DIAGNOSTIC DATA (LABS, IMAGING, TESTING) - I reviewed patient records, labs, notes, testing and imaging myself where available.   MEDICAL HISTORY:  Crystal Townsend, is a 58 year old female, seen in request by her primary care doctor Charlett, Apolinar for evaluation of low back pain, radiating pain to both lower extremity, difficulty walking, initial evaluation November 01, 2023  History is obtained from the patient and review of electronic medical records. I personally reviewed pertinent available imaging films in PACS.   PMHx of  HLD, was on Lipitor 20mg  at bedtime, stopped in Sept 2024 HTN DM Obesity  She reported history of low back pain since 2024, lower extremity muscle burning sensation, she used to walk 5 miles a day, now could barely walk half a mile, has to sit down and rest to improve her symptoms  She denied persistent lower extremity sensory loss, weakness, no bowel and bladder incontinence  X-ray of lumbar spine December 2024 was essentially normal  PHYSICAL EXAM:   Vitals:   11/01/23 1108  BP: (!) 140/84  Pulse: 84  SpO2: 99%  Weight: 236 lb 9.6 oz (107.3 kg)  Height: 5' 5 (1.651 m)    Body mass index is 39.37 kg/m.  PHYSICAL EXAMNIATION:  Gen: NAD, conversant, well nourised, well groomed                      Cardiovascular: Regular rate rhythm, no peripheral edema, warm, nontender. Eyes: Conjunctivae clear without exudates or hemorrhage Neck: Supple, no carotid bruits. Pulmonary: Clear to auscultation bilaterally   NEUROLOGICAL EXAM:  MENTAL STATUS: Speech/cognition: Awake, alert, oriented to history taking and casual conversation CRANIAL NERVES: CN II: Visual fields are full to confrontation. Pupils are round equal and briskly reactive to light. CN III, IV, VI: extraocular movement are normal. No ptosis. CN V: Facial sensation is intact to light touch CN VII: Face is symmetric with normal eye closure  CN VIII: Hearing is normal to causal conversation. CN IX, X: Phonation is normal. CN XI: Head turning and shoulder shrug are intact  MOTOR: There is no pronator drift of out-stretched arms. Muscle bulk and tone are normal. Muscle strength is normal.  REFLEXES: Reflexes are 2+ and symmetric at the biceps, triceps, knees, and ankles. Plantar responses are flexor.  SENSORY: Intact to light touch, pinprick and vibratory sensation are intact in fingers and toes.  COORDINATION: There is no trunk or limb dysmetria noted.  GAIT/STANCE: Push-up, mildly antalgic  REVIEW OF SYSTEMS:  Full 14 system review of systems performed and notable only for as above All other review of systems were negative.   ALLERGIES: Allergies  Allergen Reactions   Lisinopril     REACTION: throat epiglottal? swelling ? from ace  . Dr  Rosen evaluation.  10.09   Metformin      REACTION: severe nausea with 500 mg  Immediate release    HOME MEDICATIONS: Current Outpatient Medications  Medication Sig Dispense Refill   Acetaminophen  (TYLENOL  PO) Take by mouth. Pt reports she is taking 2 capsules a day.     aspirin  81 MG EC tablet Take 1 tablet (81 mg total) by mouth daily. Swallow whole. 100 tablet 2   cetirizine  (ZYRTEC ) 10 MG tablet TAKE 1 TABLET(10 MG) BY MOUTH DAILY 90 tablet 3   empagliflozin  (JARDIANCE )  10 MG TABS tablet TAKE 1 TABLET(10 MG) BY MOUTH DAILY 90 tablet 0   ibuprofen (ADVIL) 800 MG tablet Take 800 mg by mouth every 8 (eight) hours as needed for moderate pain.     Multiple Vitamin (MULTI-VITAMIN PO) Take by mouth.     OPTIVE SENSITIVE 0.5-0.9 % SOLN Place 1 drop into both eyes 3 (three) times daily.   11   telmisartan -hydrochlorothiazide (MICARDIS  HCT) 80-25 MG tablet Take 1 tablet by mouth daily.     atorvastatin  (LIPITOR) 20 MG tablet TAKE 1 TABLET BY MOUTH ONCE DAILY (Patient not taking: Reported on 06/07/2023) 30 tablet 0   Biotin 1000 MCG tablet Take 2,000 mcg by mouth daily.  (Patient not taking: Reported on 03/07/2023)     cyclobenzaprine  (FLEXERIL ) 10 MG tablet Take 1 tablet (10 mg total) by mouth 3 (three) times daily as needed for muscle spasms. (Patient not taking: Reported on 06/07/2023) 24 tablet 0   fluticasone  (FLONASE ) 50 MCG/ACT nasal spray 2 spray each nostril qd (Patient not taking: Reported on 06/07/2023) 16 g 11   omeprazole  (PRILOSEC) 40 MG capsule Take 1 capsule (40 mg total) by mouth daily. (Patient not taking: Reported on 06/07/2023) 90 capsule 3   VITAMIN D  PO Take 1 capsule by mouth daily. (Patient not taking: Reported on 03/07/2023)     No current facility-administered medications for this visit.    PAST MEDICAL HISTORY: Past Medical History:  Diagnosis Date   Allergic rhinitis    Allergy    Anxiety    Chest pain    Hx, no problems as of 08/10/15   Depression    Diabetes mellitus    Type II   Dysmenorrhea    Fatigue    Hx, no current problems as of 08/10/15   Fibroids    GERD (gastroesophageal reflux disease)    diet controlled, no meds   Hyperlipidemia    Hypertension    IBS (irritable bowel syndrome)    no meds, diet controlled   Impaired fasting glucose    Knee pain, bilateral    Hx, no current problems as of 08/10/15   Leukoplakia oral mucosa    2011   Menorrhagia    OSA (obstructive sleep apnea)    does not use cpap, never went to get machine    Other dysfunctions of sleep stages or arousal from sleep    Sleep apnea    Does not use CPAP   Snoring    had a sleep study mild osa 2009    PAST SURGICAL HISTORY: Past Surgical History:  Procedure Laterality Date   BREAST LUMPECTOMY WITH RADIOACTIVE SEED LOCALIZATION Right 03/05/2015   Procedure: RIGHT BREAST LUMPECTOMY WITH RADIOACTIVE SEED LOCALIZATION;  Surgeon: Debby Shipper, MD;  Location: [facility];  Service: General;  Laterality: Right;   CESAREAN SECTION  1997   COLONOSCOPY  2007   Stark - polyp   DILATION AND CURETTAGE OF UTERUS  ECTOPIC PREGNANCY SURGERY  2000   laparoscopy   oral biopsy      FAMILY HISTORY: Family History  Problem Relation Age of Onset   Lung cancer Mother    Cancer Mother        lung    Prostate cancer Father    Cancer Father        prostate   Diabetes Father    Hyperlipidemia Father    Hypertension Father    Asthma Father    Cancer Maternal Aunt        breast   Cancer Maternal Aunt        breast   Cancer Maternal Aunt        breast   Colon polyps Neg Hx    Esophageal cancer Neg Hx    Rectal cancer Neg Hx    Stomach cancer Neg Hx     SOCIAL HISTORY: Social History   Socioeconomic History   Marital status: Single    Spouse name: Not on file   Number of children: Not on file   Years of education: Not on file   Highest education level: Not on file  Occupational History   Not on file  Tobacco Use   Smoking status: Never   Smokeless tobacco: Never  Substance and Sexual Activity   Alcohol use: Yes    Comment: social wine   Drug use: No   Sexual activity: Not Currently    Birth control/protection: I.U.D.    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 : Never true  Transportation Needs: No Transportation Needs (10/07/2022)   Received from [facility]    In the past 12 months, has lack of reliable transportation kept you from medical appointments, meetings, work or from getting things needed for daily living? : No  Physical Activity: Not on file  Stress: Not on file  Social Connections: Not on file  Intimate Partner Violence: Not on file      Modena Callander, M.D. Ph.D.  [facility] [address], [address] [place], [place] [address] Ph: [phone] Fax: [phone]  CC:  Charlett Apolinar POUR, MD [address] [place],  [place] [address]  Charlett, Apolinar POUR, MD

## 2023-11-02 ENCOUNTER — Telehealth: Payer: Self-pay | Admitting: Neurology

## 2023-11-02 ENCOUNTER — Ambulatory Visit: Admitting: Neurology

## 2023-11-02 NOTE — Telephone Encounter (Signed)
 sent to [facility] they obtain [place] [facility] [phone]

## 2023-11-08 ENCOUNTER — Other Ambulatory Visit: Payer: Self-pay | Admitting: Internal Medicine

## 2023-11-13 ENCOUNTER — Ambulatory Visit
Admission: RE | Admit: 2023-11-13 | Discharge: 2023-11-13 | Disposition: A | Discharge status: Home / Self Care | Source: Ambulatory Visit | Attending: Neurology | Admitting: Neurology

## 2023-11-13 DIAGNOSIS — G8929 Other chronic pain: Secondary | ICD-10-CM | POA: Diagnosis not present

## 2023-11-13 DIAGNOSIS — M5440 Lumbago with sciatica, unspecified side: Secondary | ICD-10-CM

## 2023-11-14 ENCOUNTER — Ambulatory Visit: Payer: Self-pay | Admitting: Neurology

## 2023-11-14 NOTE — Therapy (Signed)
 OUTPATIENT PHYSICAL THERAPY THORACOLUMBAR EVALUATION   Patient Name: Crystal Townsend MRN: [id number] DOB:10/10/1965, 58 y.o., female Today's Date: 11/15/2023  END OF SESSION:  PT End of Session - 11/15/23 1353     Visit Number 1    Number of Visits 13    Date for PT Re-Evaluation 01/10/24    PT Start Time 0720    PT Stop Time 0800    PT Time Calculation (min) 40 min    Activity Tolerance Patient tolerated treatment well    Behavior During Therapy [facility] for tasks assessed/performed          Past Medical History:  Diagnosis Date   Allergic rhinitis    Allergy    Anxiety    Chest pain    Hx, no problems as of 08/10/15   Depression    Diabetes mellitus    Type II   Dysmenorrhea    Fatigue    Hx, no current problems as of 08/10/15   Fibroids    GERD (gastroesophageal reflux disease)    diet controlled, no meds   Hyperlipidemia    Hypertension    IBS (irritable bowel syndrome)    no meds, diet controlled   Impaired fasting glucose    Knee pain, bilateral    Hx, no current problems as of 08/10/15   Leukoplakia oral mucosa    2011   Menorrhagia    OSA (obstructive sleep apnea)    does not use cpap, never went to get machine   Other dysfunctions of sleep stages or arousal from sleep    Sleep apnea    Does not use CPAP   Snoring    had a sleep study mild osa 2009   Past Surgical History:  Procedure Laterality Date   BREAST LUMPECTOMY WITH RADIOACTIVE SEED LOCALIZATION Right 03/05/2015   Procedure: RIGHT BREAST LUMPECTOMY WITH RADIOACTIVE SEED LOCALIZATION;  Surgeon: Debby Shipper, MD;  Location: [facility];  Service: General;  Laterality: Right;   CESAREAN SECTION  1997   COLONOSCOPY  2007   Stark - polyp   DILATION AND CURETTAGE OF UTERUS     ECTOPIC PREGNANCY SURGERY  2000   laparoscopy   oral biopsy     Patient Active Problem List   Diagnosis Date Noted   Chronic midline low back pain with sciatica 11/01/2023   Elevated platelet count  06/03/2015   Essential hypertension 08/19/2014   Diabetes mellitus without complication (HCC) 05/27/2014   Hypopotassemia 07/18/2013   Allergic rhinitis 11/20/2012   Sleep difficulties 11/20/2012   Diabetes mellitus, type 2 (HCC) 09/26/2011   Adjustment reaction with brief depressive reaction 06/06/2011   Adjustment reaction with anxiety and depression 06/06/2011   Multiple joint pain 06/06/2011   Hyperlipidemia 02/21/2011   Prediabetes 09/26/2010   Neck pain 05/24/2010   OBESITY 12/28/2009   NECK PAIN 12/28/2009   LEUKOPLAKIA OF ORAL MUCOSA INCLUDING TONGUE 08/17/2009   NUMBNESS 04/07/2009   Headache 05/12/2008   OBSTRUCTIVE SLEEP APNEA 05/16/2007   OTH DYSFUNCTIONS SLEEP STAGES/AROUSAL FROM SLEEP 05/07/2007   FATIGUE 05/07/2007   SNORING 05/07/2007   CHEST PAIN 05/07/2007   Essential hypertension 10/25/2006   KNEE PAIN, BILATERAL 10/25/2006   HYPERGLYCEMIA 10/25/2006   Anxiety state 10/19/2006   Allergic rhinitis 10/19/2006   GERD 10/19/2006    PCP: Dr. Charlett  REFERRING PROVIDER: Dr. Modena Callander  REFERRING DIAG:  M54.40,G89.29 (ICD-10-CM) - Chronic midline low back pain with sciatica, sciatica laterality unspecified    Rationale for Evaluation and  Treatment: Rehabilitation  THERAPY DIAG:  Other low back pain  Other abnormalities of gait and mobility  ONSET DATE: 11/01/2023- date of referral  SUBJECTIVE:                                                                                                                                                                                           SUBJECTIVE STATEMENT: Pt c/o chronic lower back pain with etiology of > 1 year. She is currently reporting of burning sensation in bil LE with prolonged walking or prolonged standing. Pt takes OTC anti inflammatories for pain relief at night. Pt reports her burning sensation came first and then she developed back pain. Currently her burning is becoming constant. Before she would lay  down and burning would stop and now she is feeling burning is present even with lying down. Pt reports burning sensation in bil anterior/lateral thighs. Pt reports no burning sensation/numbness below the knees. Pt reports occasionally, she feels that her legs feel weak but does not report near falls or falls (this happens more when she has severe pain).  PERTINENT HISTORY:  PMHx of HLD, HTN, DM, Obesity   PAIN:  Are you having pain? Yes: NPRS scale: 3-9/10 Pain location: back to buttocks to lateral thigh Pain description: achy, burning, sharp Aggravating factors: prolonged standing, walking, sitting Relieving factors: position changes, OTC meds  PRECAUTIONS: None  RED FLAGS: None   WEIGHT BEARING RESTRICTIONS: No  FALLS:  Has patient fallen in last 6 months? No   OCCUPATION: Administrative position  PLOF: Independent  PATIENT GOALS: improve pain    OBJECTIVE:  Note: Objective measures were completed at Evaluation unless otherwise noted.  DIAGNOSTIC FINDINGS:  11/13/23 MRI of L spine IMPRESSION: This MRI of the lumbar spine without contrast shows the following: Transitional anatomy with a lumbarized S1 vertebral body.  This vertebral body is labeled S1 (L6) At L5-S1 (L6), there is borderline spinal stenosis due to facet hypertrophy and other degenerative changes.  There is no nerve root compression. Facet hypertrophy is noted at several other levels as detailed above without causing spinal stenosis or nerve root compression.    COGNITION: Overall cognitive status: Within functional limits for tasks assessed      PALPATION: Tender over L>R greater trochanters  LUMBAR ROM:   AROM eval  Flexion   Extension   Right lateral flexion   Left lateral flexion   Right rotation   Left rotation    (Blank rows = not tested)  LOWER EXTREMITY ROM:     Active  Right eval Left eval  Hip flexion    Hip extension  Hip abduction    Hip adduction    Hip internal  rotation    Hip external rotation    Knee flexion    Knee extension    Ankle dorsiflexion    Ankle plantarflexion    Ankle inversion    Ankle eversion     (Blank rows = not tested)  LOWER EXTREMITY MMT:    MMT Right eval Left eval  Hip flexion    Hip extension    Hip abduction    Hip adduction    Hip internal rotation    Hip external rotation    Knee flexion    Knee extension    Ankle dorsiflexion    Ankle plantarflexion    Ankle inversion    Ankle eversion     (Blank rows = not tested)   TREATMENT DATE:  11/15/23 Discussed evaluation findings. We discussed her symptoms are most likely due to chronic bil greater trochanteric bursitis which has resulted in to compensatory lower back pain as well. We discussed several treatment strategies of discussing possibility of her discussing with her PCP for oral steroids (with concerns with her DM and blood sugar level which may potentially spike with steroids), short trial of PT and potentially bursa injections if pain relief is not achieved. Supine piriformis stretch; 3 x 30 R and l Supine figure 4 stretch: 3 x 30 R and L                                                                                                                               PATIENT EDUCATION:  Education details: see above Person educated: Patient Education method: Medical illustrator Education comprehension: verbalized understanding  HOME EXERCISE PROGRAM: Access Code: [email or web address] URL: [email or web address] Date: 11/15/2023 Prepared by: Raj Blanch  Exercises - Supine Piriformis Stretch with Foot on Ground  - 1 x daily - 7 x weekly - 3 reps - 30 hold - Supine Figure 4 Piriformis Stretch  - 1 x daily - 7 x weekly - 10 reps - 10 sec hold  ASSESSMENT:  CLINICAL IMPRESSION: Patient is a 58 y.o. female who was seen today for physical therapy evaluation and treatment for chronic bil hip pain and lower back pain. Based on  subjective history of patient, symptom presentation, MRI findings and palpation findings from today's session, patient is most likely suffering from chronic bil trochanteric bursitis with compensatory chronic low back pain. Patient will benefit from skilled PT to address these impairments and improve overall function. .   OBJECTIVE IMPAIRMENTS: Abnormal gait, decreased activity tolerance, decreased balance, decreased endurance, decreased knowledge of condition, decreased mobility, difficulty walking, decreased strength, hypomobility, increased fascial restrictions, increased muscle spasms, impaired flexibility, and pain.   ACTIVITY LIMITATIONS: carrying, lifting, standing, and stairs  PARTICIPATION LIMITATIONS: meal prep, cleaning, shopping, and community activity  PERSONAL FACTORS: Time since onset of injury/illness/exacerbation and 1-2 comorbidities: DM chronic pain are also affecting patient's functional outcome.   REHAB  POTENTIAL: Good  CLINICAL DECISION MAKING: Stable/uncomplicated  EVALUATION COMPLEXITY: Low   GOALS: Goals reviewed with patient? Yes  SHORT TERM GOALS: Target date: 12/13/2023    Patient will be at least 50% compliant with HEP to self manage her symptoms. Baseline: Initiated 11/15/23 Goal status: INITIAL   LONG TERM GOALS: Target date: 01/10/2024   Patient will report pain of <4/10 at worst in bil hips and lower back to improve ability to function at home/work Baseline: 9/10 at worst Goal status: INITIAL  2.  Patient will report no burning sensation in her bil thighs with prolonged standing or walking to improve overall function at home. Baseline: constant burning in bil lateral/anterior thighs Goal status: INITIAL  3.  Patient will be 100% compliant with HEP to self manage her symptoms independently.  Baseline: initiated 11/15/23 Goal status: INITIAL  PLAN:  PT FREQUENCY: 2x/week  PT DURATION: 8 weeks  PLANNED INTERVENTIONS: 97164- PT Re-evaluation,  97750- Physical Performance Testing, 97110-Therapeutic exercises, 97530- Therapeutic activity, 97112- Neuromuscular re-education, 97535- Self Care, [id number]- Manual therapy, [phone]- Gait training, Patient/Family education, Balance training, Stair training, Joint mobilization, Spinal mobilization, DME instructions, Cryotherapy, and Moist heat.  PLAN FOR NEXT SESSION: Review and progress HEP   Raj LOISE Blanch, PT 11/15/2023, 1:55 PM

## 2023-11-15 ENCOUNTER — Other Ambulatory Visit: Payer: Self-pay

## 2023-11-15 ENCOUNTER — Ambulatory Visit: Attending: Neurology

## 2023-11-15 DIAGNOSIS — R2689 Other abnormalities of gait and mobility: Secondary | ICD-10-CM | POA: Diagnosis present

## 2023-11-15 DIAGNOSIS — M5459 Other low back pain: Secondary | ICD-10-CM | POA: Diagnosis present

## 2023-11-15 DIAGNOSIS — G8929 Other chronic pain: Secondary | ICD-10-CM | POA: Diagnosis not present

## 2023-11-15 DIAGNOSIS — M5440 Lumbago with sciatica, unspecified side: Secondary | ICD-10-CM | POA: Insufficient documentation

## 2023-11-16 ENCOUNTER — Other Ambulatory Visit

## 2023-11-17 ENCOUNTER — Ambulatory Visit

## 2023-11-20 ENCOUNTER — Encounter: Payer: Self-pay | Admitting: Internal Medicine

## 2023-11-21 MED ORDER — CETIRIZINE HCL 10 MG PO TABS: ORAL_TABLET | ORAL | 3 refills | Status: AC

## 2023-11-21 MED ORDER — EMPAGLIFLOZIN 10 MG PO TABS: ORAL_TABLET | ORAL | 0 refills | Status: DC

## 2023-11-22 ENCOUNTER — Ambulatory Visit

## 2023-11-24 ENCOUNTER — Ambulatory Visit

## 2023-11-29 ENCOUNTER — Encounter: Payer: Self-pay | Admitting: Internal Medicine

## 2023-11-29 ENCOUNTER — Ambulatory Visit

## 2023-11-29 ENCOUNTER — Telehealth: Payer: Self-pay

## 2023-11-29 NOTE — Telephone Encounter (Signed)
 Copied from CRM #[id number]. Topic: General - Other >> Nov 29, 2023  1:25 PM Franky GRADE wrote: Reason for CRM: Patient calling to follow up on the pain medication request, she sent a MyChart message on 11/20/2023 and has not gotten a response as of yet.

## 2023-11-30 ENCOUNTER — Ambulatory Visit

## 2023-11-30 NOTE — Telephone Encounter (Signed)
 Attempted to reach pt. Left a detail message that an update was sent to mychart. Pt can call us  back or message us  back via mychart if have any questions.

## 2023-12-01 ENCOUNTER — Ambulatory Visit

## 2023-12-01 NOTE — Therapy (Incomplete)
 OUTPATIENT PHYSICAL THERAPY THORACOLUMBAR TREATMENT NOTE   Patient Name: Crystal Townsend MRN: [id number] DOB:Aug 17, 1965, 58 y.o., female Today's Date: 12/01/2023  END OF SESSION:    Past Medical History:  Diagnosis Date   Allergic rhinitis    Allergy    Anxiety    Chest pain    Hx, no problems as of 08/10/15   Depression    Diabetes mellitus    Type II   Dysmenorrhea    Fatigue    Hx, no current problems as of 08/10/15   Fibroids    GERD (gastroesophageal reflux disease)    diet controlled, no meds   Hyperlipidemia    Hypertension    IBS (irritable bowel syndrome)    no meds, diet controlled   Impaired fasting glucose    Knee pain, bilateral    Hx, no current problems as of 08/10/15   Leukoplakia oral mucosa    2011   Menorrhagia    OSA (obstructive sleep apnea)    does not use cpap, never went to get machine   Other dysfunctions of sleep stages or arousal from sleep    Sleep apnea    Does not use CPAP   Snoring    had a sleep study mild osa 2009   Past Surgical History:  Procedure Laterality Date   BREAST LUMPECTOMY WITH RADIOACTIVE SEED LOCALIZATION Right 03/05/2015   Procedure: RIGHT BREAST LUMPECTOMY WITH RADIOACTIVE SEED LOCALIZATION;  Surgeon: Debby Shipper, MD;  Location: [facility];  Service: General;  Laterality: Right;   CESAREAN SECTION  1997   COLONOSCOPY  2007   Stark - polyp   DILATION AND CURETTAGE OF UTERUS     ECTOPIC PREGNANCY SURGERY  2000   laparoscopy   oral biopsy     Patient Active Problem List   Diagnosis Date Noted   Chronic midline low back pain with sciatica 11/01/2023   Elevated platelet count 06/03/2015   Essential hypertension 08/19/2014   Diabetes mellitus without complication (HCC) 05/27/2014   Hypopotassemia 07/18/2013   Allergic rhinitis 11/20/2012   Sleep difficulties 11/20/2012   Diabetes mellitus, type 2 (HCC) 09/26/2011   Adjustment reaction with brief depressive reaction 06/06/2011   Adjustment  reaction with anxiety and depression 06/06/2011   Multiple joint pain 06/06/2011   Hyperlipidemia 02/21/2011   Prediabetes 09/26/2010   Neck pain 05/24/2010   OBESITY 12/28/2009   NECK PAIN 12/28/2009   LEUKOPLAKIA OF ORAL MUCOSA INCLUDING TONGUE 08/17/2009   NUMBNESS 04/07/2009   Headache 05/12/2008   OBSTRUCTIVE SLEEP APNEA 05/16/2007   OTH DYSFUNCTIONS SLEEP STAGES/AROUSAL FROM SLEEP 05/07/2007   FATIGUE 05/07/2007   SNORING 05/07/2007   CHEST PAIN 05/07/2007   Essential hypertension 10/25/2006   KNEE PAIN, BILATERAL 10/25/2006   HYPERGLYCEMIA 10/25/2006   Anxiety state 10/19/2006   Allergic rhinitis 10/19/2006   GERD 10/19/2006    PCP: Dr. Charlett  REFERRING PROVIDER: Dr. Modena Callander  REFERRING DIAG:  M54.40,G89.29 (ICD-10-CM) - Chronic midline low back pain with sciatica, sciatica laterality unspecified    Rationale for Evaluation and Treatment: Rehabilitation  THERAPY DIAG:  No diagnosis found.  ONSET DATE: 11/01/2023- date of referral  SUBJECTIVE:  SUBJECTIVE STATEMENT: ***   Evaluation Subjective: Pt c/o chronic lower back pain with etiology of > 1 year. She is currently reporting of burning sensation in bil LE with prolonged walking or prolonged standing. Pt takes OTC anti inflammatories for pain relief at night. Pt reports her burning sensation came first and then she developed back pain. Currently her burning is becoming constant. Before she would lay down and burning would stop and now she is feeling burning is present even with lying down. Pt reports burning sensation in bil anterior/lateral thighs. Pt reports no burning sensation/numbness below the knees. Pt reports occasionally, she feels that her legs feel weak but does not report near falls or falls (this happens more when she  has severe pain).  PERTINENT HISTORY:  PMHx of HLD, HTN, DM, Obesity   PAIN:  Are you having pain? Yes: NPRS scale: 3-9/10 Pain location: back to buttocks to lateral thigh Pain description: achy, burning, sharp Aggravating factors: prolonged standing, walking, sitting Relieving factors: position changes, OTC meds  PRECAUTIONS: None  RED FLAGS: None   WEIGHT BEARING RESTRICTIONS: No  FALLS:  Has patient fallen in last 6 months? No   OCCUPATION: Administrative position  PLOF: Independent  PATIENT GOALS: improve pain    OBJECTIVE:  Note: Objective measures were completed at Evaluation unless otherwise noted.  DIAGNOSTIC FINDINGS:  11/13/23 MRI of L spine IMPRESSION: This MRI of the lumbar spine without contrast shows the following: Transitional anatomy with a lumbarized S1 vertebral body.  This vertebral body is labeled S1 (L6) At L5-S1 (L6), there is borderline spinal stenosis due to facet hypertrophy and other degenerative changes.  There is no nerve root compression. Facet hypertrophy is noted at several other levels as detailed above without causing spinal stenosis or nerve root compression.    COGNITION: Overall cognitive status: Within functional limits for tasks assessed      PALPATION: Tender over L>R greater trochanters  LUMBAR ROM:   AROM eval  Flexion   Extension   Right lateral flexion   Left lateral flexion   Right rotation   Left rotation    (Blank rows = not tested)  LOWER EXTREMITY ROM:     Active  Right eval Left eval  Hip flexion    Hip extension    Hip abduction    Hip adduction    Hip internal rotation    Hip external rotation    Knee flexion    Knee extension    Ankle dorsiflexion    Ankle plantarflexion    Ankle inversion    Ankle eversion     (Blank rows = not tested)  LOWER EXTREMITY MMT:    MMT Right eval Left eval  Hip flexion    Hip extension    Hip abduction    Hip adduction    Hip internal rotation     Hip external rotation    Knee flexion    Knee extension    Ankle dorsiflexion    Ankle plantarflexion    Ankle inversion    Ankle eversion     (Blank rows = not tested)   TREATMENT DATE:  11/15/23 Discussed evaluation findings. We discussed her symptoms are most likely due to chronic bil greater trochanteric bursitis which has resulted in to compensatory lower back pain as well. We discussed several treatment strategies of discussing possibility of her discussing with her PCP for oral steroids (with concerns with her DM and blood sugar level which may potentially spike with steroids), short  trial of PT and potentially bursa injections if pain relief is not achieved. Supine piriformis stretch; 3 x 30 R and l Supine figure 4 stretch: 3 x 30 R and L                                                                                                                               PATIENT EDUCATION:  Education details: see above Person educated: Patient Education method: Medical illustrator Education comprehension: verbalized understanding  HOME EXERCISE PROGRAM: Access Code: [email or web address] URL: [email or web address] Date: 11/15/2023 Prepared by: Raj Blanch  Exercises - Supine Piriformis Stretch with Foot on Ground  - 1 x daily - 7 x weekly - 3 reps - 30 hold - Supine Figure 4 Piriformis Stretch  - 1 x daily - 7 x weekly - 10 reps - 10 sec hold  ASSESSMENT:  CLINICAL IMPRESSION: Patient is a 58 y.o. female who was seen today for physical therapy evaluation and treatment for chronic bil hip pain and lower back pain. Based on subjective history of patient, symptom presentation, MRI findings and palpation findings from today's session, patient is most likely suffering from chronic bil trochanteric bursitis with compensatory chronic low back pain. Patient will benefit from skilled PT to address these impairments and improve overall function. .   OBJECTIVE  IMPAIRMENTS: Abnormal gait, decreased activity tolerance, decreased balance, decreased endurance, decreased knowledge of condition, decreased mobility, difficulty walking, decreased strength, hypomobility, increased fascial restrictions, increased muscle spasms, impaired flexibility, and pain.   ACTIVITY LIMITATIONS: carrying, lifting, standing, and stairs  PARTICIPATION LIMITATIONS: meal prep, cleaning, shopping, and community activity  PERSONAL FACTORS: Time since onset of injury/illness/exacerbation and 1-2 comorbidities: DM chronic pain are also affecting patient's functional outcome.   REHAB POTENTIAL: Good  CLINICAL DECISION MAKING: Stable/uncomplicated  EVALUATION COMPLEXITY: Low   GOALS: Goals reviewed with patient? Yes  SHORT TERM GOALS: Target date: 12/13/2023    Patient will be at least 50% compliant with HEP to self manage her symptoms. Baseline: Initiated 11/15/23 Goal status: INITIAL   LONG TERM GOALS: Target date: 01/10/2024   Patient will report pain of <4/10 at worst in bil hips and lower back to improve ability to function at home/work Baseline: 9/10 at worst Goal status: INITIAL  2.  Patient will report no burning sensation in her bil thighs with prolonged standing or walking to improve overall function at home. Baseline: constant burning in bil lateral/anterior thighs Goal status: INITIAL  3.  Patient will be 100% compliant with HEP to self manage her symptoms independently.  Baseline: initiated 11/15/23 Goal status: INITIAL  PLAN:  PT FREQUENCY: 2x/week  PT DURATION: 8 weeks  PLANNED INTERVENTIONS: 97164- PT Re-evaluation, 97750- Physical Performance Testing, 97110-Therapeutic exercises, 97530- Therapeutic activity, 97112- Neuromuscular re-education, 97535- Self Care, 02859- Manual therapy, [phone]- Gait training, Patient/Family education, Balance training, Stair training, Joint mobilization, Spinal mobilization, DME instructions, Cryotherapy, and Moist  heat.  PLAN FOR NEXT SESSION: Review and progress  HEP   Raj LOISE Blanch, PT 12/01/2023, 7:37 AM

## 2023-12-06 ENCOUNTER — Ambulatory Visit: Admitting: Physical Therapy

## 2023-12-08 ENCOUNTER — Ambulatory Visit

## 2023-12-12 ENCOUNTER — Ambulatory Visit

## 2023-12-13 ENCOUNTER — Ambulatory Visit: Attending: Neurology | Admitting: Physical Therapy

## 2023-12-13 ENCOUNTER — Telehealth: Payer: Self-pay | Admitting: Physical Therapy

## 2023-12-13 DIAGNOSIS — M5459 Other low back pain: Secondary | ICD-10-CM | POA: Insufficient documentation

## 2023-12-13 DIAGNOSIS — R2689 Other abnormalities of gait and mobility: Secondary | ICD-10-CM | POA: Insufficient documentation

## 2023-12-13 NOTE — Telephone Encounter (Signed)
 Called pt regarding no show appt. Pt forgot about appt today. Reminder pt of next appt with pt stating that she will be there.  Sheffield Senate, PT, DPT 12/13/23 12:50 PM   [facility] [address] [place], [place]  [address] Phone:  [phone] Fax:  [phone]

## 2023-12-14 ENCOUNTER — Ambulatory Visit

## 2023-12-15 ENCOUNTER — Ambulatory Visit: Admitting: Physical Therapy

## 2023-12-15 ENCOUNTER — Encounter: Payer: Self-pay | Admitting: Physical Therapy

## 2023-12-15 DIAGNOSIS — M5459 Other low back pain: Secondary | ICD-10-CM | POA: Diagnosis present

## 2023-12-15 DIAGNOSIS — R2689 Other abnormalities of gait and mobility: Secondary | ICD-10-CM | POA: Diagnosis present

## 2023-12-15 NOTE — Therapy (Signed)
 OUTPATIENT PHYSICAL THERAPY THORACOLUMBAR TREATMENT   Patient Name: Crystal Townsend MRN: [id number] DOB:11/14/1965, 58 y.o., female Today's Date: 12/15/2023  END OF SESSION:  PT End of Session - 12/15/23 1228     Visit Number 2    Number of Visits 13    Date for PT Re-Evaluation 01/10/24    PT Start Time 1228    PT Stop Time 1306    PT Time Calculation (min) 38 min    Activity Tolerance Patient tolerated treatment well;Patient limited by pain    Behavior During Therapy [facility] for tasks assessed/performed          Past Medical History:  Diagnosis Date   Allergic rhinitis    Allergy    Anxiety    Chest pain    Hx, no problems as of 08/10/15   Depression    Diabetes mellitus    Type II   Dysmenorrhea    Fatigue    Hx, no current problems as of 08/10/15   Fibroids    GERD (gastroesophageal reflux disease)    diet controlled, no meds   Hyperlipidemia    Hypertension    IBS (irritable bowel syndrome)    no meds, diet controlled   Impaired fasting glucose    Knee pain, bilateral    Hx, no current problems as of 08/10/15   Leukoplakia oral mucosa    2011   Menorrhagia    OSA (obstructive sleep apnea)    does not use cpap, never went to get machine   Other dysfunctions of sleep stages or arousal from sleep    Sleep apnea    Does not use CPAP   Snoring    had a sleep study mild osa 2009   Past Surgical History:  Procedure Laterality Date   BREAST LUMPECTOMY WITH RADIOACTIVE SEED LOCALIZATION Right 03/05/2015   Procedure: RIGHT BREAST LUMPECTOMY WITH RADIOACTIVE SEED LOCALIZATION;  Surgeon: Debby Shipper, MD;  Location: [facility];  Service: General;  Laterality: Right;   CESAREAN SECTION  1997   COLONOSCOPY  2007   Stark - polyp   DILATION AND CURETTAGE OF UTERUS     ECTOPIC PREGNANCY SURGERY  2000   laparoscopy   oral biopsy     Patient Active Problem List   Diagnosis Date Noted   Chronic midline low back pain with sciatica 11/01/2023    Elevated platelet count 06/03/2015   Essential hypertension 08/19/2014   Diabetes mellitus without complication (HCC) 05/27/2014   Hypopotassemia 07/18/2013   Allergic rhinitis 11/20/2012   Sleep difficulties 11/20/2012   Diabetes mellitus, type 2 (HCC) 09/26/2011   Adjustment reaction with brief depressive reaction 06/06/2011   Adjustment reaction with anxiety and depression 06/06/2011   Multiple joint pain 06/06/2011   Hyperlipidemia 02/21/2011   Prediabetes 09/26/2010   Neck pain 05/24/2010   OBESITY 12/28/2009   NECK PAIN 12/28/2009   LEUKOPLAKIA OF ORAL MUCOSA INCLUDING TONGUE 08/17/2009   NUMBNESS 04/07/2009   Headache 05/12/2008   OBSTRUCTIVE SLEEP APNEA 05/16/2007   OTH DYSFUNCTIONS SLEEP STAGES/AROUSAL FROM SLEEP 05/07/2007   FATIGUE 05/07/2007   SNORING 05/07/2007   CHEST PAIN 05/07/2007   Essential hypertension 10/25/2006   KNEE PAIN, BILATERAL 10/25/2006   HYPERGLYCEMIA 10/25/2006   Anxiety state 10/19/2006   Allergic rhinitis 10/19/2006   GERD 10/19/2006    PCP: Dr. Charlett  REFERRING PROVIDER: Dr. Modena Callander  REFERRING DIAG:  M54.40,G89.29 (ICD-10-CM) - Chronic midline low back pain with sciatica, sciatica laterality unspecified    Rationale  for Evaluation and Treatment: Rehabilitation  THERAPY DIAG:  Other low back pain  Other abnormalities of gait and mobility  ONSET DATE: 11/01/2023- date of referral  SUBJECTIVE:                                                                                                                                                                                           SUBJECTIVE STATEMENT: Was going to reach out to Dr. Onita to see if there is anything she can take to help wit her pain or a muscle relaxer. Goals are to be able to eradicate her pain. When she would bend down she would get tense. If she is able to relax more, able to be a little more flexible and that seems to be helping. Standing for a while, the pain does  intensity (30- 40 minutes). Current pain is confined in the low back. Pain down the leg has somewhat diminished and the burning sensation is not as bad.    PERTINENT HISTORY:  PMHx of HLD, HTN, DM, Obesity   PAIN:  Are you having pain? Yes: NPRS scale: 2/10 Pain location: low back Pain description: achy, burning, sharp Aggravating factors: prolonged standing, walking, sitting Relieving factors: position changes, OTC meds  PRECAUTIONS: None  RED FLAGS: None   WEIGHT BEARING RESTRICTIONS: No  FALLS:  Has patient fallen in last 6 months? No   OCCUPATION: Administrative position  PLOF: Independent  PATIENT GOALS: improve pain    OBJECTIVE:  Note: Objective measures were completed at Evaluation unless otherwise noted.  DIAGNOSTIC FINDINGS:  11/13/23 MRI of L spine IMPRESSION: This MRI of the lumbar spine without contrast shows the following: Transitional anatomy with a lumbarized S1 vertebral body.  This vertebral body is labeled S1 (L6) At L5-S1 (L6), there is borderline spinal stenosis due to facet hypertrophy and other degenerative changes.  There is no nerve root compression. Facet hypertrophy is noted at several other levels as detailed above without causing spinal stenosis or nerve root compression.    COGNITION: Overall cognitive status: Within functional limits for tasks assessed      PALPATION: Tender over L>R greater trochanters  LUMBAR ROM:   AROM eval  Flexion   Extension   Right lateral flexion   Left lateral flexion   Right rotation   Left rotation    (Blank rows = not tested)  LOWER EXTREMITY ROM:     Active  Right eval Left eval  Hip flexion    Hip extension    Hip abduction    Hip adduction    Hip internal rotation    Hip external rotation  Knee flexion    Knee extension    Ankle dorsiflexion    Ankle plantarflexion    Ankle inversion    Ankle eversion     (Blank rows = not tested)  LOWER EXTREMITY MMT:    MMT Right eval  Left eval  Hip flexion    Hip extension    Hip abduction    Hip adduction    Hip internal rotation    Hip external rotation    Knee flexion    Knee extension    Ankle dorsiflexion    Ankle plantarflexion    Ankle inversion    Ankle eversion     (Blank rows = not tested)   TREATMENT DATE:  12/15/23  Therapeutic Exercise: Lower trunk rotations 2 x 10 reps, cues for gentle ROM  Single knee to chest stretch with RLE, pt reporting incr cramping in R hip flexors so discontinued exercise Posterior pelvic tilts with initial tactile cues for technique 2 x 10 reps                      Bridging 5 reps, cues for technique, pt requesting not to do anymore after 5 reps  Gentle prone press ups 2 x 10 reps, pt reports back is hurting with these, rest break in between  Modified child's pose over chair 3 x 20-30 seconds         Physioball roll outs 5 reps forwards and 1 rep to R and L, pt requesting to stop after this                             PATIENT EDUCATION:  Education details: additions to HEP and purpose of exercises (bolded below), purpose of PT and what will be addressed in regards to back and hip  Person educated: Patient Education method: Explanation, Demonstration, Verbal cues, and Handouts Education comprehension: verbalized understanding, returned demonstration, and needs further education  HOME EXERCISE PROGRAM: Access Code: [email or web address] URL: [email or web address] Date: 12/15/2023 Prepared by: Sheffield Senate  Exercises - Supine Piriformis Stretch with Foot on Ground  - 1 x daily - 7 x weekly - 3 reps - 30 hold - Supine Figure 4 Piriformis Stretch  - 1 x daily - 7 x weekly - 10 reps - 10 sec hold - Lower Trunk Rotations  - 1-2 x daily - 7 x weekly - 2 sets - 10 reps - Supine Posterior Pelvic Tilt  - 1-2 x daily - 7 x weekly - 2 sets - 10 reps - Seated Child's Pose with Table  - 1-2 x daily - 7 x weekly - 2 sets - 5 reps - 10-15 hold  ASSESSMENT:  CLINICAL  IMPRESSION: Today's skilled session focused on working on different exercises for low back and hip pain. Pt reporting incr pain with majority of exercises, so added some to HEP that pt was able to tolerate as pt with decr tolerance to movement. Pt needing to drop frequency down to 1x a week or every other week due to finances. Will continue to progress towards LTGs as able.  .   OBJECTIVE IMPAIRMENTS: Abnormal gait, decreased activity tolerance, decreased balance, decreased endurance, decreased knowledge of condition, decreased mobility, difficulty walking, decreased strength, hypomobility, increased fascial restrictions, increased muscle spasms, impaired flexibility, and pain.   ACTIVITY LIMITATIONS: carrying, lifting, standing, and stairs  PARTICIPATION LIMITATIONS: meal prep, cleaning, shopping, and community activity  PERSONAL FACTORS: Time since onset of  injury/illness/exacerbation and 1-2 comorbidities: DM chronic pain are also affecting patient's functional outcome.   REHAB POTENTIAL: Good  CLINICAL DECISION MAKING: Stable/uncomplicated  EVALUATION COMPLEXITY: Low   GOALS: Goals reviewed with patient? Yes  SHORT TERM GOALS: Target date: 12/13/2023    Patient will be at least 50% compliant with HEP to self manage her symptoms. Baseline: Initiated 11/15/23 Goal status: INITIAL   LONG TERM GOALS: Target date: 01/10/2024   Patient will report pain of <4/10 at worst in bil hips and lower back to improve ability to function at home/work Baseline: 9/10 at worst Goal status: INITIAL  2.  Patient will report no burning sensation in her bil thighs with prolonged standing or walking to improve overall function at home. Baseline: constant burning in bil lateral/anterior thighs Goal status: INITIAL  3.  Patient will be 100% compliant with HEP to self manage her symptoms independently.  Baseline: initiated 11/15/23 Goal status: INITIAL  PLAN:  PT FREQUENCY: 2x/week  PT  DURATION: 8 weeks  PLANNED INTERVENTIONS: 97164- PT Re-evaluation, 97750- Physical Performance Testing, 97110-Therapeutic exercises, 97530- Therapeutic activity, 97112- Neuromuscular re-education, 97535- Self Care, 02859- Manual therapy, [phone]- Gait training, Patient/Family education, Balance training, Stair training, Joint mobilization, Spinal mobilization, DME instructions, Cryotherapy, and Moist heat.  PLAN FOR NEXT SESSION: work on exercises for low back mobility and hip/core strengthening    Sheffield LOISE Senate, PT, DPT 12/15/2023, 1:09 PM

## 2023-12-18 ENCOUNTER — Ambulatory Visit

## 2023-12-20 ENCOUNTER — Ambulatory Visit

## 2023-12-25 ENCOUNTER — Ambulatory Visit

## 2023-12-26 ENCOUNTER — Ambulatory Visit

## 2023-12-28 ENCOUNTER — Ambulatory Visit

## 2023-12-29 ENCOUNTER — Ambulatory Visit: Admitting: Physical Therapy

## 2024-01-01 ENCOUNTER — Ambulatory Visit

## 2024-01-03 ENCOUNTER — Ambulatory Visit

## 2024-01-12 ENCOUNTER — Encounter: Payer: Self-pay | Admitting: Internal Medicine

## 2024-01-12 ENCOUNTER — Ambulatory Visit: Attending: Neurology | Admitting: Physical Therapy

## 2024-01-12 MED ORDER — EMPAGLIFLOZIN 10 MG PO TABS: ORAL_TABLET | ORAL | 0 refills | Status: DC

## 2024-01-16 ENCOUNTER — Other Ambulatory Visit: Payer: Self-pay | Admitting: Internal Medicine

## 2024-01-16 DIAGNOSIS — E785 Hyperlipidemia, unspecified: Secondary | ICD-10-CM

## 2024-01-16 DIAGNOSIS — R944 Abnormal results of kidney function studies: Secondary | ICD-10-CM

## 2024-01-16 DIAGNOSIS — E1169 Type 2 diabetes mellitus with other specified complication: Secondary | ICD-10-CM

## 2024-01-16 DIAGNOSIS — I10 Essential (primary) hypertension: Secondary | ICD-10-CM

## 2024-01-16 DIAGNOSIS — Z79899 Other long term (current) drug therapy: Secondary | ICD-10-CM

## 2024-01-16 NOTE — Telephone Encounter (Signed)
 Not doing pain med per se  see notes  pt etc

## 2024-01-16 NOTE — Progress Notes (Signed)
 Due for  labs prefer fasting  if possible Lab appt ant then visit to reveiw

## 2024-01-16 NOTE — Telephone Encounter (Signed)
   Record review Appears to be due for labs and visit  I will order future labs    have her make lab appt and then   clinic appt for yearly check  If taking telmisartan  hydrochlorothiazide ok to refill x 90 days in interim    Lab Results  Component Value Date   WBC 6.2 01/04/2023   HGB 13.3 01/04/2023   HCT 41.5 01/04/2023   PLT 377.0 01/04/2023   GLUCOSE 85 03/07/2023   CHOL 189 01/04/2023   TRIG 150.0 (H) 01/04/2023   HDL 61.50 01/04/2023   LDLDIRECT 162.5 03/07/2013   LDLCALC 98 01/04/2023   ALT 15 01/04/2023   AST 18 01/04/2023   NA 140 03/07/2023   K 4.7 03/07/2023   CL 100 03/07/2023   CREATININE 1.09 03/07/2023   BUN 14 03/07/2023   CO2 30 03/07/2023   TSH 0.98 01/04/2023   HGBA1C 6.8 (A) 01/04/2023   MICROALBUR 1.2 02/05/2009

## 2024-01-17 ENCOUNTER — Telehealth: Payer: Self-pay

## 2024-01-17 ENCOUNTER — Other Ambulatory Visit (HOSPITAL_COMMUNITY): Payer: Self-pay

## 2024-01-17 MED ORDER — TELMISARTAN-HCTZ 80-25 MG PO TABS: 1.0000 | ORAL_TABLET | Freq: Every day | ORAL | 0 refills | Status: DC

## 2024-01-23 ENCOUNTER — Other Ambulatory Visit

## 2024-01-23 DIAGNOSIS — E785 Hyperlipidemia, unspecified: Secondary | ICD-10-CM | POA: Diagnosis not present

## 2024-01-23 DIAGNOSIS — I10 Essential (primary) hypertension: Secondary | ICD-10-CM

## 2024-01-23 DIAGNOSIS — R944 Abnormal results of kidney function studies: Secondary | ICD-10-CM | POA: Diagnosis not present

## 2024-01-23 DIAGNOSIS — Z79899 Other long term (current) drug therapy: Secondary | ICD-10-CM

## 2024-01-23 DIAGNOSIS — E1169 Type 2 diabetes mellitus with other specified complication: Secondary | ICD-10-CM

## 2024-01-23 LAB — BASIC METABOLIC PANEL WITH GFR
BUN: 13 mg/dL (ref 6–23)
CO2: 28 meq/L (ref 19–32)
Calcium: 9.5 mg/dL (ref 8.4–10.5)
Chloride: 104 meq/L (ref 96–112)
Creatinine, Ser: 1.08 mg/dL (ref 0.40–1.20)
GFR: 55.89 mL/min — ABNORMAL LOW (ref >60.00)
Glucose, Bld: 136 mg/dL — ABNORMAL HIGH (ref 70–99)
Potassium: 4.3 meq/L (ref 3.5–5.1)
Sodium: 140 meq/L (ref 135–145)

## 2024-01-23 LAB — CBC WITH DIFFERENTIAL/PLATELET
Basophils Absolute: 0 K/uL (ref 0.0–0.1)
Basophils Relative: 0.4 % (ref 0.0–3.0)
Eosinophils Absolute: 0.2 K/uL (ref 0.0–0.7)
Eosinophils Relative: 3.8 % (ref 0.0–5.0)
HCT: 40.8 % (ref 36.0–46.0)
Hemoglobin: 13.1 g/dL (ref 12.0–15.0)
Lymphocytes Relative: 34.8 % (ref 12.0–46.0)
Lymphs Abs: 1.5 K/uL (ref 0.7–4.0)
MCHC: 32.2 g/dL (ref 30.0–36.0)
MCV: 87.8 fl (ref 78.0–100.0)
Monocytes Absolute: 0.3 K/uL (ref 0.1–1.0)
Monocytes Relative: 6.4 % (ref 3.0–12.0)
Neutro Abs: 2.4 K/uL (ref 1.4–7.7)
Neutrophils Relative %: 54.6 % (ref 43.0–77.0)
Platelets: 352 K/uL (ref 150.0–400.0)
RBC: 4.64 Mil/uL (ref 3.87–5.11)
RDW: 15.2 % (ref 11.5–15.5)
WBC: 4.4 K/uL (ref 4.0–10.5)

## 2024-01-23 LAB — VITAMIN B12: Vitamin B-12: 484 pg/mL (ref 211–911)

## 2024-01-23 LAB — TSH: TSH: 1.45 u[IU]/mL (ref 0.35–5.50)

## 2024-01-23 LAB — MICROALBUMIN / CREATININE URINE RATIO
Creatinine,U: 205.3 mg/dL
Microalb Creat Ratio: 4 mg/g (ref 0.0–30.0)
Microalb, Ur: 0.8 mg/dL (ref 0.0–1.9)

## 2024-01-23 LAB — HEPATIC FUNCTION PANEL
ALT: 20 U/L (ref 0–35)
AST: 20 U/L (ref 0–37)
Albumin: 4.4 g/dL (ref 3.5–5.2)
Alkaline Phosphatase: 67 U/L (ref 39–117)
Bilirubin, Direct: 0.1 mg/dL (ref 0.0–0.3)
Total Bilirubin: 0.4 mg/dL (ref 0.2–1.2)
Total Protein: 7.9 g/dL (ref 6.0–8.3)

## 2024-01-23 LAB — LIPID PANEL
Cholesterol: 224 mg/dL — ABNORMAL HIGH (ref 0–200)
HDL: 47.6 mg/dL (ref >39.00)
LDL Cholesterol: 153 mg/dL — ABNORMAL HIGH (ref 0–99)
NonHDL: 176.73
Total CHOL/HDL Ratio: 5
Triglycerides: 117 mg/dL (ref 0.0–149.0)
VLDL: 23.4 mg/dL (ref 0.0–40.0)

## 2024-01-23 LAB — HEMOGLOBIN A1C: Hgb A1c MFr Bld: 7.2 % — ABNORMAL HIGH (ref 4.6–6.5)

## 2024-01-30 ENCOUNTER — Ambulatory Visit: Admitting: Internal Medicine

## 2024-01-30 ENCOUNTER — Encounter: Payer: Self-pay | Admitting: Internal Medicine

## 2024-01-30 VITALS — BP 110/66 | HR 66 | Temp 97.8°F | Ht 66.14 in | Wt 233.6 lb

## 2024-01-30 DIAGNOSIS — Z23 Encounter for immunization: Secondary | ICD-10-CM

## 2024-01-30 DIAGNOSIS — E66812 Obesity, class 2: Secondary | ICD-10-CM

## 2024-01-30 DIAGNOSIS — Z7985 Long-term (current) use of injectable non-insulin antidiabetic drugs: Secondary | ICD-10-CM

## 2024-01-30 DIAGNOSIS — Z79899 Other long term (current) drug therapy: Secondary | ICD-10-CM | POA: Diagnosis not present

## 2024-01-30 DIAGNOSIS — Z0000 Encounter for general adult medical examination without abnormal findings: Secondary | ICD-10-CM | POA: Diagnosis not present

## 2024-01-30 DIAGNOSIS — E785 Hyperlipidemia, unspecified: Secondary | ICD-10-CM

## 2024-01-30 DIAGNOSIS — E1169 Type 2 diabetes mellitus with other specified complication: Secondary | ICD-10-CM

## 2024-01-30 DIAGNOSIS — Z6837 Body mass index (BMI) 37.0-37.9, adult: Secondary | ICD-10-CM

## 2024-01-30 DIAGNOSIS — I10 Essential (primary) hypertension: Secondary | ICD-10-CM

## 2024-01-30 MED ORDER — TIRZEPATIDE 2.5 MG/0.5ML ~~LOC~~ SOAJ: 2.5000 mg | SUBCUTANEOUS | 1 refills | Status: DC

## 2024-01-30 NOTE — Patient Instructions (Addendum)
 Good to see  you today . Limit sweet portion size  Continue  appropriate exercises  and muscle exercise to help back and keep up muscle mass on medications.   Limit advil aleve  type meds to protect kidney function.   Ordering  beginning mounjaro ( glp 1 gip) med for DM and helps with weight control.  After 4 weeks plan  fu to adjust medication dosing as tolerated  can do virtual appt . ( Don't get the refill  of 2.5 mg  if  we want to increase dosing to 5 mg per week)  Stay on jardiance    stay hydrated .Crystal Townsend   In future visit  plan on restarting a statin medication for  cholesterol control .  Topical magnesium  ok .

## 2024-01-30 NOTE — Progress Notes (Signed)
 Chief Complaint  Patient presents with   Annual Exam    GLP 1- for weight management. Flu shot, shingle and RSV    HPI: Patient  Crystal Townsend  58 y.o. comes in today for Preventive Health Care visit  and fu meds and matabolic conditions   HLD Atorva NT stopped when had  ms leg sx   didn't noted a difference  DM   taking Jardiance   gets a sweet tooth with ice cream cannot tolerate   metformin   no eye sx  Interested in GLP 1 and to discuss as possible  help with weight and bg control  Micardis  hydrochlorothiazide   Bp control  Ms had back eval and given exercises  ( cost too much to do as formal PT)  and helps some  using mg opl on rub feet to help sx   (Eval for back and leg sx   Dr Onita  mri spine  brderline spinal stenosis sent for  PT )  Health Maintenance  Topic Date Due   HIV Screening  Never done   COVID-19 Vaccine (8 - 2025-26 season) 02/15/2024 (Originally 12/04/2023)   Cervical Cancer Screening (HPV/Pap Cotest)  05/01/2024 (Originally 06/05/2021)   Mammogram  07/30/2024 (Originally 07/24/2023)   Pneumococcal Vaccine: 50+ Years (3 of 3 - PCV20 or PCV21) 01/29/2025 (Originally 06/02/2020)   Zoster Vaccines- Shingrix (2 of 2) 03/26/2024   HEMOGLOBIN A1C  07/23/2024   OPHTHALMOLOGY EXAM  10/23/2024   Diabetic kidney evaluation - eGFR measurement  01/22/2025   Diabetic kidney evaluation - Urine ACR  01/22/2025   FOOT EXAM  01/29/2025   DTaP/Tdap/Td (2 - Td or Tdap) 10/19/2025   Colonoscopy  03/17/2027   Influenza Vaccine  Completed   Hepatitis C Screening  Completed   Hepatitis B Vaccines 19-59 Average Risk  Aged Out   HPV VACCINES  Aged Out   Meningococcal B Vaccine  Aged Out   Health Maintenance Review LIFESTYLE:  Exercise:   see above adaptic activity  Tobacco/ETS:n Alcohol:  Sugar beverages:n Sleep: ok Drug use: no HH of 2  son no pets  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 GI /GU: No adominal pain, vomiting, change in bowel habits. No blood in the stool. No significant GU symptoms. SKIN/HEME: ,no acute skin rashes suspicious lesions or bleeding. No lymphadenopathy, nodules, masses.  NEURO/ PSYCH:  No neurologic signs such as weakness numbness. No depression anxiety. IMM/ Allergy: No unusual infections.  Allergy .   REST of 12 system review negative except as per HPI   Past Medical History:  Diagnosis Date   Allergic rhinitis    Allergy    Anxiety    Chest pain    Hx, no problems as of 08/10/15   Depression    Diabetes mellitus    Type II   Dysmenorrhea    Fatigue    Hx, no current problems as of 08/10/15   Fibroids    GERD (gastroesophageal reflux disease)    diet controlled, no meds   Hyperlipidemia    Hypertension    IBS (irritable bowel syndrome)    no meds, diet controlled   Impaired fasting glucose    Knee pain, bilateral    Hx, no current problems as of 08/10/15  Leukoplakia oral mucosa    2011   Menorrhagia    OSA (obstructive sleep apnea)    does not use cpap, never went to get machine   Other dysfunctions of sleep stages or arousal from sleep    Sleep apnea    Does not use CPAP   Snoring    had a sleep study mild osa 2009    Past Surgical History:  Procedure Laterality Date   BREAST LUMPECTOMY WITH RADIOACTIVE SEED LOCALIZATION Right 03/05/2015   Procedure: RIGHT BREAST LUMPECTOMY WITH RADIOACTIVE SEED LOCALIZATION;  Surgeon: Debby Shipper, MD;  Location: [facility];  Service: General;  Laterality: Right;   CESAREAN SECTION  1997   COLONOSCOPY  2007   [facility] - polyp   DILATION AND CURETTAGE OF UTERUS     ECTOPIC PREGNANCY SURGERY  2000   laparoscopy   oral biopsy      Family History  Problem Relation Age of Onset   Lung cancer Mother    Cancer Mother        lung     Prostate cancer Father    Cancer Father        prostate   Diabetes Father    Hyperlipidemia Father    Hypertension Father    Asthma Father    Cancer Maternal Aunt        breast   Cancer Maternal Aunt        breast   Cancer Maternal Aunt        breast   Colon polyps Neg Hx    Esophageal cancer Neg Hx    Rectal cancer Neg Hx    Stomach cancer Neg Hx     Social History   Socioeconomic History   Marital status: Single    Spouse name: Not on file   Number of children: Not on file   Years of education: Not on file   Highest education level: Not on file  Occupational History   Not on file  Tobacco Use   Smoking status: Never   Smokeless tobacco: Never  Substance and Sexual Activity   Alcohol use: Yes    Comment: social wine   Drug use: No   Sexual activity: Not Currently    Birth control/protection: I.U.D.    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 : Never true  Transportation Needs: No Transportation Needs (10/07/2022)   Received from [facility]    In the past 12 months, has lack of reliable transportation kept you from medical appointments, meetings, work or from getting things needed for daily living? : No  Physical Activity: Not on file  Stress: Not on file  Social Connections: Not on file    Outpatient Medications Prior to Visit  Medication  Sig Dispense Refill    Acetaminophen  (TYLENOL  PO) Take by mouth. Pt reports she is taking 2 capsules a day.     aspirin  81 MG EC tablet Take 1 tablet (81 mg total) by mouth daily. Swallow whole. 100 tablet 2   cetirizine  (ZYRTEC ) 10 MG tablet TAKE 1 TABLET(10 MG) BY MOUTH DAILY 90 tablet 3   empagliflozin  (JARDIANCE ) 10 MG TABS tablet TAKE 1 TABLET(10 MG) BY MOUTH DAILY 90 tablet 0   ibuprofen (ADVIL) 800 MG tablet Take 800 mg by mouth every 8 (eight) hours as needed for moderate pain.     Multiple Vitamin (MULTI-VITAMIN PO) Take by mouth.     OPTIVE SENSITIVE 0.5-0.9 % SOLN Place 1 drop into both eyes 3 (three) times daily.   11   telmisartan -hydrochlorothiazide (MICARDIS  HCT) 80-25 MG tablet Take 1 tablet by mouth daily. 90 tablet 0   tiZANidine  (ZANAFLEX ) 4 MG tablet Take 1 tablet (4 mg total) by mouth every 6 (six) hours as needed. 30 tablet 6   Biotin 1000 MCG tablet Take 2,000 mcg by mouth daily.  (Patient not taking: Reported on 01/30/2024)     fluticasone  (FLONASE ) 50 MCG/ACT nasal spray 2 spray each nostril qd (Patient not taking: Reported on 01/30/2024) 16 g 11   omeprazole  (PRILOSEC) 40 MG capsule Take 1 capsule (40 mg total) by mouth daily. (Patient not taking: Reported on 01/30/2024) 90 capsule 3   VITAMIN D  PO Take 1 capsule by mouth daily. (Patient not taking: Reported on 01/30/2024)     atorvastatin  (LIPITOR) 20 MG tablet TAKE 1 TABLET BY MOUTH ONCE DAILY (Patient not taking: Reported on 01/30/2024) 30 tablet 0   cyclobenzaprine  (FLEXERIL ) 10 MG tablet Take 1 tablet (10 mg total) by mouth 3 (three) times daily as needed for muscle spasms. (Patient not taking: Reported on 01/30/2024) 24 tablet 0   No facility-administered medications prior to visit.     EXAM:  BP 110/66 (BP Location: Right Arm, Patient Position: Sitting, Cuff Size: Large)   Pulse 66   Temp 97.8 F (36.6 C) (Oral)   Ht 5' 6.14 (1.68 m)   Wt 233 lb 9.6 oz (106 kg)   SpO2 97%   BMI 37.54 kg/m   Body mass index is 37.54  kg/m. Wt Readings from Last 3 Encounters:  01/30/24 233 lb 9.6 oz (106 kg)  11/01/23 236 lb 9.6 oz (107.3 kg)  06/07/23 233 lb (105.7 kg)    Physical Exam: Vital signs reviewed HZW:Uypd is a well-developed well-nourished alert cooperative    who appearsr stated age in no acute distress.  HEENT: normocephalic atraumatic , Eyes: PERRL EOM's full, conjunctiva clear, Nares: paten,t no deformity discharge or tenderness., Ears: no deformity EAC's clear TMs with normal landmarks. Mouth: clear OP, no lesions, edema.  Moist mucous membranes. Dentition in adequate repair. NECK: supple without masses, thyromegaly or bruits. CHEST/PULM:  Clear to auscultation and percussion breath sounds equal no wheeze , rales or rhonchi. CV: PMI is nondisplaced, S1 S2 no gallops, murmurs, rubs. Peripheral pulses are full without delay.No JVD .  ABDOMEN: Bowel sounds normal nontender  No guard or rebound, no hepato splenomegal no CVA tenderness.  Extremtities:  No clubbing cyanosis or edema, no acute joint swelling or redness no focal atrophy NEURO:  Oriented x3, cranial nerves 3-12 appear to be intact, no obvious focal weakness,gait within normal limits no abnormal reflexes or asymmetrical SKIN: No acute rashes normal turgor, color, no bruising or petechiae. PSYCH: Oriented, good eye contact, no obvious  depression anxiety, cognition and judgment appear normal. LN: no cervical axillary adenopathy Diabetic Foot Exam - Simple   Simple Foot Form Diabetic Foot exam was performed with the following findings: Yes 01/30/2024  3:17 PM  Visual Inspection No deformities, no ulcerations, no other skin breakdown bilaterally: Yes Sensation Testing Intact to touch and monofilament testing bilaterally: Yes Pulse Check Posterior Tibialis and Dorsalis pulse intact bilaterally: Yes Comments     Lab Results  Component Value Date   WBC 4.4 01/23/2024   HGB 13.1 01/23/2024   HCT 40.8 01/23/2024   PLT 352.0 01/23/2024    GLUCOSE 136 (H) 01/23/2024   CHOL 224 (H) 01/23/2024   TRIG 117.0 01/23/2024   HDL 47.60 01/23/2024   LDLDIRECT 162.5 03/07/2013   LDLCALC 153 (H) 01/23/2024   ALT 20 01/23/2024   AST 20 01/23/2024   NA 140 01/23/2024   K 4.3 01/23/2024   CL 104 01/23/2024   CREATININE 1.08 01/23/2024   BUN 13 01/23/2024   CO2 28 01/23/2024   TSH 1.45 01/23/2024   HGBA1C 7.2 (H) 01/23/2024   MICROALBUR 0.8 01/23/2024    BP Readings from Last 3 Encounters:  01/30/24 110/66  11/01/23 (!) 140/84  06/06/23 (!) 150/79    Lab results reviewed with patient   ASSESSMENT AND PLAN:  Discussed the following assessment and plan:    ICD-10-CM   1. Visit for preventive health examination  Z00.00     2. Type 2 diabetes mellitus with other specified complication, without long-term current use of insulin (HCC)  E11.69     3. Medication management  Z79.899     4. Hyperlipidemia, unspecified hyperlipidemia type  E78.5     5. Class 2 severe obesity with serious comorbidity and body mass index (BMI) of 37.0 to 37.9 in adult, unspecified obesity type  E66.812    Z68.37     6. Essential hypertension  I10     7. Need for shingles vaccine  Z23 Zoster Recombinant (Shingrix )    8. Needs flu shot  Z23 Flu vaccine trivalent PF, 6mos and older(Flulaval,Afluria,Fluarix,Fluzone)    Reviewed immunization    and plan   Disc glp1 ok candidate and cont jardiance   Rx mounjaro 2.5mg  weekly  starter dose and 4 week fu contact about increasing dose to 5 weekly  and then q 3 mos or as indicated  At future appt  will add back a statin to help with ldl  Glad her leg sx are getting better  suspect from back  continue adaptive exercise . Egfr in 58 ranges neg protein excretion screen.fu  ( on arb)  Return for 1 month after beginning mounjaro  can be virtual for med check .  Patient Care Team: Vanisha Whiten, Apolinar POUR, MD as PCP - Diedre Perri Bjork, PA-C as Physician Assistant (Obstetrics and Gynecology) Octavia Charlie Hamilton, MD as Consulting Physician (Ophthalmology) Patient Instructions  Good to see  you today . Limit sweet portion size  Continue  appropriate exercises  and muscle exercise to help back and keep up muscle mass on medications.   Limit advil aleve  type meds to protect kidney function.   Ordering  beginning mounjaro ( glp 1 gip) med for DM and helps with weight control.  After 4 weeks plan  fu to adjust medication dosing as tolerated  can do virtual appt . ( Don't get the refill  of 2.5 mg  if  we want to increase dosing to 5 mg per week)  Stay on  jardiance    stay hydrated .SABRA   In future visit  plan on restarting a statin medication for  cholesterol control .  Topical magnesium  ok .    Sedrick Tober K. Audi Conover M.D.

## 2024-02-01 ENCOUNTER — Telehealth: Payer: Self-pay

## 2024-02-01 ENCOUNTER — Other Ambulatory Visit (HOSPITAL_COMMUNITY): Payer: Self-pay

## 2024-02-01 NOTE — Telephone Encounter (Signed)
 Please help with PA on Mounjaro.

## 2024-02-01 NOTE — Telephone Encounter (Signed)
 Error

## 2024-02-11 ENCOUNTER — Encounter: Payer: Self-pay | Admitting: Internal Medicine

## 2024-02-14 ENCOUNTER — Telehealth: Payer: Self-pay

## 2024-02-14 ENCOUNTER — Other Ambulatory Visit (HOSPITAL_COMMUNITY): Payer: Self-pay

## 2024-02-14 NOTE — Telephone Encounter (Signed)
 Pharmacy Patient Advocate Encounter   Received notification from Patient Advice Request messages that prior authorization for Micardis  80-25 is required/requested.   Insurance verification completed.   The patient is insured through [employer].   Per test claim: PA required; PA submitted to above mentioned insurance via Latent Key/confirmation #/EOC [facility] Status is pending

## 2024-02-15 ENCOUNTER — Other Ambulatory Visit (HOSPITAL_COMMUNITY): Payer: Self-pay

## 2024-02-15 NOTE — Telephone Encounter (Signed)
 Pharmacy Patient Advocate Encounter  Received notification from [facility] that Prior Authorization for Micardis  80-25 has been APPROVED from 02/14/24 to 02/13/25. Ran test claim, Copay is $15.00. This test claim was processed through [facility]- copay amounts may vary at other pharmacies due to pharmacy/plan contracts, or as the patient moves through the different stages of their insurance plan.   Approval letter in patient media

## 2024-02-16 ENCOUNTER — Telehealth: Payer: Self-pay

## 2024-02-16 ENCOUNTER — Other Ambulatory Visit (HOSPITAL_COMMUNITY): Payer: Self-pay

## 2024-02-16 NOTE — Telephone Encounter (Signed)
 Pharmacy Patient Advocate Encounter  Received notification from [facility] that Prior Authorization for Mounjaro 2.5 has been APPROVED from 02/16/24 to 02/15/25. Ran test claim, Copay is $25.00. This test claim was processed through [facility]- copay amounts may vary at other pharmacies due to pharmacy/plan contracts, or as the patient moves through the different stages of their insurance plan.   PA #/Case ID/Reference #: [id number]

## 2024-02-16 NOTE — Telephone Encounter (Signed)
 Follow up with pharmacy and spoke to [place]. She reports med went through with $15 copay a month. Will takes them couple of hours. Inform pt of updated information. Pt verbalized understanding and states she will contact them tomorrow. Pt follow up on Mounjaro. Inform pt our PA still working on it. No further action is needed.

## 2024-02-16 NOTE — Telephone Encounter (Signed)
 Pharmacy Patient Advocate Encounter   Received notification from Pt Calls Messages that prior authorization for Mounjaro 2.5 is required/requested.   Insurance verification completed.   The patient is insured through [employer].   Per test claim: PA required; PA submitted to above mentioned insurance via Latent Key/confirmation #/EOC [id number] Status is pending

## 2024-02-23 ENCOUNTER — Ambulatory Visit: Admitting: Neurology

## 2024-03-02 ENCOUNTER — Other Ambulatory Visit: Payer: Self-pay | Admitting: Internal Medicine

## 2024-03-05 ENCOUNTER — Ambulatory Visit: Admitting: Internal Medicine

## 2024-03-27 ENCOUNTER — Encounter: Payer: Self-pay | Admitting: Internal Medicine

## 2024-03-27 ENCOUNTER — Ambulatory Visit: Admitting: Internal Medicine

## 2024-03-27 ENCOUNTER — Other Ambulatory Visit: Payer: Self-pay | Admitting: Internal Medicine

## 2024-03-27 VITALS — BP 108/56 | HR 88 | Temp 98.7°F | Ht 66.14 in | Wt 228.4 lb

## 2024-03-27 DIAGNOSIS — E1169 Type 2 diabetes mellitus with other specified complication: Secondary | ICD-10-CM | POA: Diagnosis not present

## 2024-03-27 DIAGNOSIS — I10 Essential (primary) hypertension: Secondary | ICD-10-CM

## 2024-03-27 DIAGNOSIS — Z79899 Other long term (current) drug therapy: Secondary | ICD-10-CM

## 2024-03-27 DIAGNOSIS — E785 Hyperlipidemia, unspecified: Secondary | ICD-10-CM

## 2024-03-27 DIAGNOSIS — Z7985 Long-term (current) use of injectable non-insulin antidiabetic drugs: Secondary | ICD-10-CM

## 2024-03-27 MED ORDER — TIRZEPATIDE 5 MG/0.5ML ~~LOC~~ SOAJ: 5.0000 mg | SUBCUTANEOUS | 3 refills | Status: AC

## 2024-03-27 MED ORDER — ATORVASTATIN CALCIUM 10 MG PO TABS: ORAL_TABLET | ORAL | 3 refills | Status: AC

## 2024-03-27 NOTE — Patient Instructions (Signed)
 Good to see  you  today.   Will increase  mounjaro  to 5 mg per week.   Bp repeat  110/78.   Ok   Ok to restart     the atorvastatin  .  2 x per week   Plan lab in 2-3 mos  and then fu visit

## 2024-03-27 NOTE — Progress Notes (Signed)
 "  Chief Complaint  Patient presents with   Medical Management of Chronic Issues    Follow up on mounjaro . Pt reports Crystal Townsend is doing well with it. No se.     HPI: Crystal Townsend 58 y.o. come in for Chronic disease management   DM has had 1 month of starter dose 2.5 Mounjaro  feels pretty well does have decreased appetite for sweets and earlier satiety is a positive without vomiting diarrhea. HLD has been off the atorvastatin  would consider going back on but would like to start at 2 times a week and work its way up to meet goal. Hypertension no chest pain shortness of breath syncope symptoms.  ROS: See pertinent positives and negatives per HPI.  Past Medical History:  Diagnosis Date   Allergic rhinitis    Allergy    Anxiety    Chest pain    Hx, no problems as of 08/10/15   Depression    Diabetes mellitus    Type II   Dysmenorrhea    Fatigue    Hx, no current problems as of 08/10/15   Fibroids    GERD (gastroesophageal reflux disease)    diet controlled, no meds   Hyperlipidemia    Hypertension    IBS (irritable bowel syndrome)    no meds, diet controlled   Impaired fasting glucose    Knee pain, bilateral    Hx, no current problems as of 08/10/15   Leukoplakia oral mucosa    2011   Menorrhagia    OSA (obstructive sleep apnea)    does not use cpap, never went to get machine   Other dysfunctions of sleep stages or arousal from sleep    Sleep apnea    Does not use CPAP   Snoring    had a sleep study mild osa 2009    Family History  Problem Relation Age of Onset   Lung cancer Mother    Cancer Mother        lung    Prostate cancer Father    Cancer Father        prostate   Diabetes Father    Hyperlipidemia Father    Hypertension Father    Asthma Father    Cancer Maternal Aunt        breast   Cancer Maternal Aunt        breast   Cancer Maternal Aunt        breast   Colon polyps Neg Hx    Esophageal cancer Neg Hx    Rectal cancer Neg Hx    Stomach cancer  Neg Hx     Social History   Socioeconomic History   Marital status: Single    Spouse name: Not on file   Number of children: Not on file   Years of education: Not on file   Highest education level: Not on file  Occupational History   Not on file  Tobacco Use   Smoking status: Never   Smokeless tobacco: Never  Substance and Sexual Activity   Alcohol use: Yes    Comment: social wine   Drug use: No   Sexual activity: Not Currently    Birth control/protection: I.U.D.    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 : Never true  Transportation Needs: No Transportation Needs (10/07/2022)   Received from [facility]    In the past 12 months, has lack of reliable transportation kept you from medical appointments, meetings, work or from getting things needed for daily living? : No  Physical Activity: Not on file  Stress: Not on file  Social Connections: Not on file  Depression (PHQ2-9): Low Risk (01/30/2024)   Depression (PHQ2-9)    PHQ-2 Score: 0  Alcohol Screen: Not on file  Housing: Low Risk (10/07/2022)   Received from [facility]   Epic    What is your living situation today?: I have a steady place to live    Think about the place you live. Do  you have problems with any of the following? Choose all that apply:: Not on file  Utilities: Low Risk (10/07/2022)   Received from [facility]   Utilities    In the past 12 months has the electric, gas, oil, or water company threatened to shut off services in your home? : No  Health Literacy: Not on file    Outpatient Medications Prior to Visit  Medication Sig Dispense Refill   Acetaminophen  (TYLENOL  PO) Take by mouth. Pt reports Crystal Townsend is taking 2 capsules a day.     aspirin  81 MG EC tablet Take 1 tablet (81 mg total) by mouth daily. Swallow whole. 100 tablet 2   cetirizine  (ZYRTEC ) 10 MG tablet TAKE 1 TABLET(10 MG) BY MOUTH DAILY 90 tablet 3   ibuprofen (ADVIL) 800 MG tablet Take 800 mg by mouth every 8 (eight) hours as needed for moderate pain.     JARDIANCE  10 MG TABS tablet TAKE 1 TABLET(10 MG) BY MOUTH DAILY 90 tablet 0   Multiple Vitamin (MULTI-VITAMIN PO) Take by mouth.     OPTIVE SENSITIVE 0.5-0.9 % SOLN Place 1 drop into both eyes 3 (three) times daily.   11   telmisartan -hydrochlorothiazide (MICARDIS  HCT) 80-25 MG tablet Take 1 tablet by mouth daily. 90 tablet 0   tiZANidine  (ZANAFLEX ) 4 MG tablet Take 1 tablet (4 mg total) by mouth every 6 (six) hours as needed. 30 tablet 6   tirzepatide  (MOUNJARO ) 2.5 MG/0.5ML Pen Inject 2.5 mg into the skin once a week. Starter dose 2 mL 1   Biotin 1000 MCG tablet Take 2,000 mcg by mouth daily.  (Patient not taking: Reported on 03/27/2024)     fluticasone  (FLONASE ) 50 MCG/ACT nasal spray 2 spray each nostril qd (Patient not taking: Reported on 03/27/2024) 16 g 11   omeprazole  (PRILOSEC) 40 MG capsule Take 1 capsule (40 mg total) by mouth daily. (Patient not taking: Reported on 03/27/2024) 90 capsule 3   VITAMIN D  PO Take 1 capsule by mouth daily. (Patient not taking: Reported on 03/27/2024)     No facility-administered medications prior  to visit.     EXAM:  BP (!) 108/56 (BP Location: Left Arm, Patient Position: Sitting, Cuff Size: Large)    Pulse 88   Temp 98.7 F (37.1 C) (Oral)   Ht 5' 6.14 (1.68 m)   Wt 228 lb 6.4 oz (103.6 kg)   SpO2 95%   BMI 36.71 kg/m   Body mass index is 36.71 kg/m. Wt Readings from Last 3 Encounters:  03/27/24 228 lb 6.4 oz (103.6 kg)  01/30/24 233 lb 9.6 oz (106 kg)  11/01/23 236 lb 9.6 oz (107.3 kg)  Repeat blood pressure right arm sitting 110/78  GENERAL: vitals reviewed and listed above, alert, oriented, appears well hydrated and in no acute distress HEENT: atraumatic, conjunctiva  clear, no obvious abnormalities on inspection of external nose and ears NECK: no obvious masses on inspection palpation  LUNGS: clear to auscultation bilaterally, no wheezes, rales or rhonchi, good air movement CV: HRRR, no clubbing cyanosis or  peripheral edema nl cap refill  MS: moves all extremities without noticeable focal  abnormality PSYCH: pleasant and cooperative, no obvious depression or anxiety Lab Results  Component Value Date   WBC 4.4 01/23/2024   HGB 13.1 01/23/2024   HCT 40.8 01/23/2024   PLT 352.0 01/23/2024   GLUCOSE 136 (H) 01/23/2024   CHOL 224 (H) 01/23/2024   TRIG 117.0 01/23/2024   HDL 47.60 01/23/2024   LDLDIRECT 162.5 03/07/2013   LDLCALC 153 (H) 01/23/2024   ALT 20 01/23/2024   AST 20 01/23/2024   NA 140 01/23/2024   K 4.3 01/23/2024   CL 104 01/23/2024   CREATININE 1.08 01/23/2024   BUN 13 01/23/2024   CO2 28 01/23/2024   TSH 1.45 01/23/2024   HGBA1C 7.2 (H) 01/23/2024   MICROALBUR 0.8 01/23/2024   BP Readings from Last 3 Encounters:  03/27/24 (!) 108/56  01/30/24 110/66  11/01/23 (!) 140/84    ASSESSMENT AND PLAN:  Discussed the following assessment and plan:  Type 2 diabetes mellitus with other specified complication, without long-term current use of insulin (HCC)  Medication management  Hyperlipidemia, unspecified hyperlipidemia type  Morbid obesity, unspecified obesity type (HCC)  Essential hypertension Plan increase Mounjaro  to 5 mg a week Crystal Townsend  will start the atorvastatin  2 times a week and increase as tolerated after next lab test. Plan lab in 2 to 3 months lipids A1c BMP to follow GFR. If symptoms of low blood pressure we can decrease adjust her antihypertensive medication. -Patient advised to return or notify health care team  if  new concerns arise.  Patient Instructions  Good to see  you  today.   Will increase  mounjaro  to 5 mg per week.   Bp repeat  110/78.   Ok   Ok to restart     the atorvastatin  .  2 x per week   Plan lab in 2-3 mos  and then fu visit    Neytiri Asche K. Hudson Lehmkuhl M.D. "

## 2024-03-27 NOTE — Progress Notes (Signed)
 Future orders after restarting  atorvastatin  2 x per week  and inc mounjaro  to 5 weekly

## 2024-04-15 ENCOUNTER — Encounter: Payer: Self-pay | Admitting: Internal Medicine

## 2024-04-16 ENCOUNTER — Other Ambulatory Visit (HOSPITAL_COMMUNITY): Payer: Self-pay

## 2024-04-16 MED ORDER — TELMISARTAN-HCTZ 80-25 MG PO TABS: ORAL_TABLET | ORAL | 0 refills | Status: AC

## 2024-04-16 MED ORDER — EMPAGLIFLOZIN 10 MG PO TABS: ORAL_TABLET | ORAL | 0 refills | Status: AC

## 2024-04-16 NOTE — Telephone Encounter (Signed)
 Jardiance  and Telmisartan  Rx are sent.   Please help with PA on Mounjaro  5mg . Thank you!

## 2024-04-19 ENCOUNTER — Other Ambulatory Visit (HOSPITAL_COMMUNITY): Payer: Self-pay

## 2024-05-02 ENCOUNTER — Other Ambulatory Visit: Payer: Self-pay | Admitting: Obstetrics and Gynecology

## 2024-05-02 DIAGNOSIS — Z1231 Encounter for screening mammogram for malignant neoplasm of breast: Secondary | ICD-10-CM

## 2024-05-07 ENCOUNTER — Other Ambulatory Visit: Payer: Self-pay | Admitting: Family

## 2024-06-06 ENCOUNTER — Other Ambulatory Visit

## 2024-06-07 ENCOUNTER — Ambulatory Visit

## 2024-06-27 ENCOUNTER — Ambulatory Visit: Admitting: Internal Medicine
